# Patient Record
Sex: Female | Born: 1935 | Race: White | Hispanic: No | Marital: Married | Smoking: Never smoker
Health system: Southern US, Community
[De-identification: ages and names within clinical notes are randomized; demographics above are authoritative.]

## PROBLEM LIST (undated history)

## (undated) DIAGNOSIS — G309 Alzheimer's disease, unspecified: Secondary | ICD-10-CM

## (undated) DIAGNOSIS — F0281 Dementia in other diseases classified elsewhere with behavioral disturbance: Secondary | ICD-10-CM

## (undated) DIAGNOSIS — A09 Infectious gastroenteritis and colitis, unspecified: Secondary | ICD-10-CM

## (undated) DIAGNOSIS — E785 Hyperlipidemia, unspecified: Secondary | ICD-10-CM

## (undated) DIAGNOSIS — B300 Keratoconjunctivitis due to adenovirus: Secondary | ICD-10-CM

## (undated) DIAGNOSIS — Z9889 Other specified postprocedural states: Secondary | ICD-10-CM

## (undated) DIAGNOSIS — N183 Chronic kidney disease, stage 3 unspecified: Secondary | ICD-10-CM

## (undated) DIAGNOSIS — I10 Essential (primary) hypertension: Secondary | ICD-10-CM

## (undated) DIAGNOSIS — K219 Gastro-esophageal reflux disease without esophagitis: Secondary | ICD-10-CM

## (undated) DIAGNOSIS — F02818 Dementia in other diseases classified elsewhere, unspecified severity, with other behavioral disturbance: Secondary | ICD-10-CM

## (undated) DIAGNOSIS — E039 Hypothyroidism, unspecified: Secondary | ICD-10-CM

## (undated) DIAGNOSIS — K529 Noninfective gastroenteritis and colitis, unspecified: Secondary | ICD-10-CM

## (undated) DIAGNOSIS — M810 Age-related osteoporosis without current pathological fracture: Secondary | ICD-10-CM

## (undated) DIAGNOSIS — L821 Other seborrheic keratosis: Secondary | ICD-10-CM

## (undated) DIAGNOSIS — Z8719 Personal history of other diseases of the digestive system: Secondary | ICD-10-CM

## (undated) DIAGNOSIS — M1990 Unspecified osteoarthritis, unspecified site: Secondary | ICD-10-CM

## (undated) DIAGNOSIS — M75121 Complete rotator cuff tear or rupture of right shoulder, not specified as traumatic: Secondary | ICD-10-CM

## (undated) DIAGNOSIS — K5790 Diverticulosis of intestine, part unspecified, without perforation or abscess without bleeding: Secondary | ICD-10-CM

## (undated) HISTORY — DX: Dementia in other diseases classified elsewhere, unspecified severity, with other behavioral disturbance: F02.818

## (undated) HISTORY — DX: Noninfective gastroenteritis and colitis, unspecified: K52.9

## (undated) HISTORY — DX: Hyperlipidemia, unspecified: E78.5

## (undated) HISTORY — PX: OTHER SURGICAL HISTORY: SHX169

## (undated) HISTORY — DX: Complete rotator cuff tear or rupture of right shoulder, not specified as traumatic: M75.121

## (undated) HISTORY — DX: Other seborrheic keratosis: L82.1

## (undated) HISTORY — DX: Unspecified osteoarthritis, unspecified site: M19.90

## (undated) HISTORY — DX: Infectious gastroenteritis and colitis, unspecified: A09

## (undated) HISTORY — DX: Keratoconjunctivitis due to adenovirus: B30.0

## (undated) HISTORY — DX: Dementia in other diseases classified elsewhere with behavioral disturbance: F02.81

## (undated) HISTORY — DX: Alzheimer's disease, unspecified: G30.9

## (undated) HISTORY — DX: Hypothyroidism, unspecified: E03.9

## (undated) HISTORY — DX: Age-related osteoporosis without current pathological fracture: M81.0

## (undated) HISTORY — DX: Gastro-esophageal reflux disease without esophagitis: K21.9

## (undated) HISTORY — PX: ABDOMINAL HYSTERECTOMY: SHX81

## (undated) HISTORY — DX: Chronic kidney disease, stage 3 unspecified: N18.30

## (undated) HISTORY — DX: Diverticulosis of intestine, part unspecified, without perforation or abscess without bleeding: K57.90

## (undated) HISTORY — DX: Personal history of other diseases of the digestive system: Z87.19

## (undated) HISTORY — DX: Essential (primary) hypertension: I10

## (undated) HISTORY — DX: Chronic kidney disease, stage 3 (moderate): N18.3

## (undated) HISTORY — DX: Other specified postprocedural states: Z98.890

## (undated) HISTORY — PX: BREAST SURGERY: SHX581

---

## 1999-07-23 ENCOUNTER — Encounter: Payer: Self-pay | Admitting: Emergency Medicine

## 1999-07-23 ENCOUNTER — Emergency Department (HOSPITAL_COMMUNITY): Admission: EM | Admit: 1999-07-23 | Discharge: 1999-07-23 | Payer: Self-pay | Admitting: Emergency Medicine

## 2000-03-21 ENCOUNTER — Other Ambulatory Visit: Admission: RE | Admit: 2000-03-21 | Discharge: 2000-03-21 | Payer: Self-pay | Admitting: Gynecology

## 2000-03-21 ENCOUNTER — Encounter: Payer: Self-pay | Admitting: Gynecology

## 2000-03-21 ENCOUNTER — Encounter: Admission: RE | Admit: 2000-03-21 | Discharge: 2000-03-21 | Payer: Self-pay | Admitting: Gynecology

## 2000-05-16 ENCOUNTER — Encounter: Payer: Self-pay | Admitting: Urology

## 2000-05-18 ENCOUNTER — Observation Stay (HOSPITAL_COMMUNITY): Admission: RE | Admit: 2000-05-18 | Discharge: 2000-05-19 | Payer: Self-pay | Admitting: Urology

## 2001-01-29 ENCOUNTER — Encounter: Admission: RE | Admit: 2001-01-29 | Discharge: 2001-01-29 | Payer: Self-pay | Admitting: Family Medicine

## 2001-01-29 ENCOUNTER — Encounter: Payer: Self-pay | Admitting: Family Medicine

## 2001-11-06 ENCOUNTER — Encounter: Admission: RE | Admit: 2001-11-06 | Discharge: 2001-11-06 | Payer: Self-pay | Admitting: Urology

## 2001-11-06 ENCOUNTER — Encounter: Payer: Self-pay | Admitting: Urology

## 2002-01-07 ENCOUNTER — Encounter: Payer: Self-pay | Admitting: Gastroenterology

## 2002-01-07 ENCOUNTER — Ambulatory Visit (HOSPITAL_COMMUNITY): Admission: RE | Admit: 2002-01-07 | Discharge: 2002-01-07 | Payer: Self-pay | Admitting: Gastroenterology

## 2003-02-27 ENCOUNTER — Encounter: Payer: Self-pay | Admitting: Gastroenterology

## 2003-02-27 ENCOUNTER — Ambulatory Visit (HOSPITAL_COMMUNITY): Admission: RE | Admit: 2003-02-27 | Discharge: 2003-02-27 | Payer: Self-pay | Admitting: Gastroenterology

## 2003-03-05 ENCOUNTER — Encounter: Payer: Self-pay | Admitting: Gastroenterology

## 2003-03-05 ENCOUNTER — Ambulatory Visit (HOSPITAL_COMMUNITY): Admission: RE | Admit: 2003-03-05 | Discharge: 2003-03-05 | Payer: Self-pay | Admitting: Gastroenterology

## 2003-05-12 ENCOUNTER — Encounter: Payer: Self-pay | Admitting: Family Medicine

## 2003-05-12 ENCOUNTER — Encounter: Admission: RE | Admit: 2003-05-12 | Discharge: 2003-05-12 | Payer: Self-pay | Admitting: Family Medicine

## 2003-11-21 ENCOUNTER — Inpatient Hospital Stay (HOSPITAL_COMMUNITY): Admission: EM | Admit: 2003-11-21 | Discharge: 2003-11-24 | Payer: Self-pay | Admitting: Emergency Medicine

## 2004-09-06 ENCOUNTER — Ambulatory Visit: Payer: Self-pay | Admitting: Internal Medicine

## 2004-12-07 ENCOUNTER — Ambulatory Visit (HOSPITAL_COMMUNITY): Admission: RE | Admit: 2004-12-07 | Discharge: 2004-12-07 | Payer: Self-pay | Admitting: Orthopedic Surgery

## 2004-12-07 ENCOUNTER — Ambulatory Visit (HOSPITAL_BASED_OUTPATIENT_CLINIC_OR_DEPARTMENT_OTHER): Admission: RE | Admit: 2004-12-07 | Discharge: 2004-12-07 | Payer: Self-pay | Admitting: Orthopedic Surgery

## 2005-02-13 ENCOUNTER — Emergency Department (HOSPITAL_COMMUNITY): Admission: EM | Admit: 2005-02-13 | Discharge: 2005-02-13 | Payer: Self-pay | Admitting: Emergency Medicine

## 2005-10-02 ENCOUNTER — Emergency Department (HOSPITAL_COMMUNITY): Admission: EM | Admit: 2005-10-02 | Discharge: 2005-10-03 | Payer: Self-pay | Admitting: *Deleted

## 2005-10-19 ENCOUNTER — Ambulatory Visit: Payer: Self-pay | Admitting: Gastroenterology

## 2006-01-27 ENCOUNTER — Encounter: Admission: RE | Admit: 2006-01-27 | Discharge: 2006-01-27 | Payer: Self-pay | Admitting: Internal Medicine

## 2006-07-05 ENCOUNTER — Ambulatory Visit: Payer: Self-pay | Admitting: Gastroenterology

## 2006-07-10 ENCOUNTER — Ambulatory Visit (HOSPITAL_COMMUNITY): Admission: RE | Admit: 2006-07-10 | Discharge: 2006-07-10 | Payer: Self-pay | Admitting: Gastroenterology

## 2006-07-26 ENCOUNTER — Ambulatory Visit: Payer: Self-pay | Admitting: Gastroenterology

## 2006-08-15 ENCOUNTER — Ambulatory Visit: Payer: Self-pay | Admitting: Gastroenterology

## 2007-02-20 ENCOUNTER — Encounter: Admission: RE | Admit: 2007-02-20 | Discharge: 2007-02-20 | Payer: Self-pay | Admitting: Internal Medicine

## 2007-12-11 ENCOUNTER — Encounter: Admission: RE | Admit: 2007-12-11 | Discharge: 2007-12-11 | Payer: Self-pay | Admitting: Neurology

## 2008-02-22 ENCOUNTER — Encounter: Admission: RE | Admit: 2008-02-22 | Discharge: 2008-02-22 | Payer: Self-pay | Admitting: Family Medicine

## 2009-02-26 ENCOUNTER — Encounter: Admission: RE | Admit: 2009-02-26 | Discharge: 2009-02-26 | Payer: Self-pay | Admitting: Family Medicine

## 2010-10-13 ENCOUNTER — Encounter
Admission: RE | Admit: 2010-10-13 | Discharge: 2010-10-13 | Payer: Self-pay | Source: Home / Self Care | Attending: Legal Medicine | Admitting: Legal Medicine

## 2010-11-21 ENCOUNTER — Encounter: Payer: Self-pay | Admitting: Gastroenterology

## 2011-03-18 NOTE — Cardiovascular Report (Signed)
NAME:  [name], [name]                            ACCOUNT NO.:  [id number]   MEDICAL RECORD NO.:  [id number]                   PATIENT TYPE:  INP   LOCATION:  3731                                 FACILITY:  [facility]   PHYSICIAN:  Charlies Constable, M.D.                  DATE OF BIRTH:  1931-02-22   DATE OF PROCEDURE:  11/24/2003  DATE OF DISCHARGE:                              CARDIAC CATHETERIZATION   PAST MEDICAL HISTORY:  Ms. Vanderheyden is 77 years old and has not seen [place] before,  although I took care of her first husband who died about four years ago.  She has since remarried.  She has no prior history of known heart disease.  About a week prior to admission, she had a fall and hurt her left arm.  On  the 21st, she developed substernal chest pain with pain into her left arm  which was much more severe.  She came to the emergency room at [facility]  and was admitted by Dietrich Pates.  She was scheduled for evaluation with  angiography.   PAST MEDICAL HISTORY:  Significant for hyperlipidemia and reflux.   PROCEDURES:  The procedure was performed via right femoral artery and  arterial sheath and 6-French sheath and coronary catheters.  A __________  was performed and Omnipaque contrast was used.  The right femoral artery was  closed with Angio-Seal at the end of the procedure.  The patient tolerated  the procedure well and left the laboratory in satisfactory condition.   RESULTS:  1. LEFT MAIN CORONARY ARTERY.  The left main coronary artery is free of     significant disease.  2. LEFT ANTERIOR DESCENDING ARTERY.  The left anterior descending artery     gave rise to a large diagonal branch and two septal perforators.  These     in the LA__________  are free of significant disease.  3. LEFT CIRCUMFLEX ARTERY.  The left circumflex area was a codominant vessel     that gives rise to a small ramus branch, two marginal branches, and two     large posterolateral branches.  These vessels are free of  significant     disease.  4. RIGHT CORONARY ARTERY.  The right coronary artery was a small codominant     vessel that gave rise to a right ventricular branch and a small posterior     descending branch.  This vessel was free of significant disease.  5. LEFT VENTRICULOGRAM.  Left ventriculogram was performed in the RAO     projection and showed good wall motion but no areas of hypokinesis.  The     estimated ejection fraction was 60%.  6. The aortic pressure was 122/60 with a mean of 85.  The left ventricular     pressure was 122/17.   CONCLUSION:  Normal coronary angiography and left ventricular function.   RECOMMENDATIONS:  Reassurance.  In view of these findings, I think the  patient's symptoms were not related to her heart.  They may be related to a  combination of her fall and reflux.  We will plan to let her go home as an  outpatient today and see Dr. Shary Decamp in followup.                                               Charlies Constable, M.D.    BB/MEDQ  D:  11/24/2003  T:  11/24/2003  Job:  [id number]   cc:   Feliciana Rossetti, MD  [address]  [place]  [place] [address]  Fax: [phone]   Pricilla Riffle, M.D.   Charlies Constable, M.D.

## 2011-03-18 NOTE — Op Note (Signed)
[name], [name]                  ACCOUNT NO.:  [id number]   MEDICAL RECORD NO.:  [id number]          PATIENT TYPE:  AMB   LOCATION:  DSC                          FACILITY:  [facility]   PHYSICIAN:  Leonides Grills, M.D.     DATE OF BIRTH:  [birth year]   DATE OF PROCEDURE:  12/07/2004  DATE OF DISCHARGE:                                 OPERATIVE REPORT   PREOPERATIVE DIAGNOSIS:  Right hallux valgus.   POSTOPERATIVE DIAGNOSIS:  Right hallux valgus.   OPERATION:  1.  Right chevron bunionectomy.  2.  Stress x-rays of right foot.   SURGEON:  Leonides Grills, M.D.   ASSISTANT:  Lianne Cure, P.A.   ANESTHESIA:  General endotracheal tube.   ESTIMATED BLOOD LOSS:  Minimal.   TOURNIQUET TIME:  Approximately 45 minutes.   COMPLICATIONS:  None.   DISPOSITION:  Stable, to the PR.   INDICATIONS:  This is a 77 year old female with longstanding right great toe  pain that was interfering with her life despite conservative management, and  she was consented for the above procedure.  All risks, which include  infection, nerve or vessel injury, nonunion, malunion, heart irritation or  failure, persistent pain, worse pain, stiffness, arthritis, avascular  necrosis of the head with collapse and possible future fusion were all  explained, questions were encouraged and answered.   OPERATION:  The patient was brought to the operating room and placed in a  supine position.  After adequate general endotracheal tube anesthesia was  administered as well as Ancef 1 g IV piggyback, the right lower extremity  was then prepped and draped in a sterile manner with a proximally placed  thigh tourniquet.  The limb was gravity-exsanguinated.  Tourniquet was  elevated to 290 mmHg.  A longitudinal incision in the midline over the  medial aspect of the great toe MP joint was then made and dissection was  carried down through skin and hemostasis maintained.  Neurovascular  structures were identified both superiorly  and inferiorly and protected  throughout the case.  An L-shaped capsulotomy was then made.  A simple  bunionectomy was then performed with a sagittal saw.  Lateral capsule was  then released from within the joint using a curved Beaver blade.  The center  of the head was then identified, soft tissues elevated both superiorly and  inferiorly over the distal aspect of the metatarsal.  Chevron osteotomy was  then created with a sagittal saw.  Head was then translated approximately 3-  4 mm laterally and then affixed with a 2.0-mm fully threaded cortical set  screw using a 1.5-mm drill hole, respectively.  This was countersunk.  This  had excellent purchase and maintenance of the correction.  Redundant bone  medially was then trimmed off with a sagittal saw.  Rocky Link Johnson's ridge was  then rounded off with a rongeur.  The joint and area were copiously  irrigated with normal saline.  Capsule was then repaired and advanced both  superiorly and proximally, and affixed with 2-0 Vicryl suture; this had an  outstanding repair.  Stress x-rays were  obtained in the AP and lateral  planes and showed that the correction was excellent, fixation was in the  proper position and there was no gross motion of the osteotomy site.  Tourniquet was deflated, hemostasis was obtained.  I copiously irrigated the  capsule with normal saline.  Skin was closed with 4-0 nylon sutures.  A  sterile dressing was applied, [employer] dressing was applied, hard-sole  shoe was applied.  The patient was stable to PR.      PB/MEDQ  D:  12/07/2004  T:  12/07/2004  Job:  [id number]

## 2011-03-18 NOTE — Assessment & Plan Note (Signed)
[name]                           GASTROENTEROLOGY OFFICE NOTE   [name], [name]                         MRN:          [id number]  DATE:07/05/2006                            DOB:          June 23, 1931    [name] comes in on September 5, says she has some difficulty swallowing,  especially over the last several months.  Has previously had a diltation.  She has been getting sour brash in the mornings.   MEDICATIONS:  1. Ginkgo.  2. Cod liver oil.  3. Baby aspirin.  4. AcipHex.   She says she has trouble with liquid and solids. Drinks waters and gets  strangled and she also notes this with other liquids.   It should be noted her sister had colon cancer.   PHYSICAL EXAMINATION:  VITAL SIGNS:  She weighed 128.  Blood pressure  130/82.  Pulse 90 and regular.  NECK:  Unremarkable.   IMPRESSION:  1. 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  2. History of breast cancer.  3. Status post cholecystectomy, hysterectomy.  4. Osteoporosis.  5. Irritable bowel syndrome with a history of constipation component.   RECOMMENDATIONS:  1. Get a barium swallow with an upper gastrointestinal series.  2. I put her on Carafate suspension 1 teaspoon t.i.d.  3. MiraLax for her constipation.  4. Obtain colon screens on her.  5. Schedule her for an upper endoscopy with diltation and colonoscopy.                                   Ulyess Mort, MD   SML/MedQ  DD:  07/06/2006  DT:  07/07/2006  Job #:  [id number]

## 2011-03-18 NOTE — Assessment & Plan Note (Signed)
[facility]                           GASTROENTEROLOGY OFFICE NOTE   KHRISTIE, SAK                         MRN:          [id number]  DATE:07/26/2006                            DOB:          [birth year]    SUBJECTIVE:  Kallista comes in and says she is still having trouble swallowing.  The vent flow revealed primarily a motility problem with severe evidence of  dysmotility.  She says that the Carafate is not helping.  I suggested that she continue it for a little bit longer.   MEDICATIONS:  1. She should continue other medications which include Effexor.  2. Aciphex.   PLAN:  I told her that we should continue with the procedures at the end of  October and do an EGD and colonoscopy.  She was in agreement with this.            ______________________________  Ulyess Mort, MD      SML/MedQ  DD:  07/26/2006  DT:  07/28/2006  Job #:  [id number]

## 2011-03-18 NOTE — Discharge Summary (Signed)
NAMEJERRY, HAUGEN                            ACCOUNT NO.:  [id number]   MEDICAL RECORD NO.:  [id number]                   PATIENT TYPE:  INP   LOCATION:  3731                                 FACILITY:  [facility]   PHYSICIAN:  Doylene Canning. Ladona Ridgel, M.D.               DATE OF BIRTH:  [birth year]   DATE OF ADMISSION:  11/21/2003  DATE OF DISCHARGE:  11/24/2003                           DISCHARGE SUMMARY - REFERRING   PROCEDURES:  1. Cardiac catheterization.  2. Coronary arteriogram.  3. Left ventriculogram.  4. Three-view shoulder x-rays.   HOSPITAL COURSE:  Ms. Wieman is a 77 year old female with no known history of  coronary artery disease.  She had substernal chest tightness the night  before admission that radiated to her left arm.  She took Tums and aspirin  with no relief.  EMS gave her sublingual nitroglycerin x2 with her pain  relieved.  She has some left arm _________.  She had a fall last week after  tripping and landed on her left hip and arm.  She was admitted for further  evaluation and treatment.   Her enzymes were negative for MI, and a cardiac cath was considered  indicated based on her cardiac risk factors.  This was performed on November 24, 2003.  The cardiac catheterization showed normal coronary arteries with  normal left ventricular function and an EF of approximately 60%.  It was  felt that no further cardiac workup was indicated, and she could follow up  with Dr. Shary Decamp in [place].   Ms. Kory has a history of hyperlipidemia but has refused statin in the past  based on fears of liver damage.  A lipid profile was obtained during her  hospital stay here, and it showed a total cholesterol of 241, triglycerides  101, HDL 47, LDL 174.  The situation was discussed with Ms. Hert, who  stated that she had been eating a low-fat diet with eating meats that were  low in saturated fat and not using high-fat cooking processing.  She was  advised that diet control was not  sufficient for her, and she probably  needed the medication, but because she had no coronary artery disease, this  would be left up to Dr. Shary Decamp.  She was advised there were other classes of  medications besides statin as well as Zetia, which has a lower side-effect  profile than the other set.  She is to discuss this with Dr. Shary Decamp when she  sees him.  Ms. Hargraves also complained of bilateral breast tenderness, which  is not a known side effect of any of the medications she is currently  taking.  She stated she had a mammogram as an outpatient that showed no  abnormality, and she was advised to follow up with Dr. Shary Decamp on this as  well.  Ms. Batley also complained of a hematoma on her left hip.  She stated  that this had gotten larger since she had been in the hospital.  She still  had good movement of this leg and stated that she had x-rays in [place]  without any notification that there was any kind of fracture or damage.  She  was moving her leg well, and on examination, there was an approximately 3-4  cm hematoma that was soft, and resolving ecchymosis was seen as well.  She  was advised to follow up with Dr. Shary Decamp on that and notify him if the  hematoma became any larger.   Ms. Kunsman has a history of anxiety disorder and had been on Zoloft prior to  admission.  She was to continue on his medication at her home dose.   Post-cath, Ms. Trentham was ambulating without chest pain or shortness of  breath.  She was angiosealed, and her cath site had no ecchymosis or  hematoma.  She was considered stable for discharge on November 24, 2003, and  she was made aware that she needs to follow up with Dr. Shary Decamp within the  next two weeks.   Additionally, she had an iron profile checked because of some mild anemia.  The iron profile showed an iron level that was slightly low at 36 with a  TIBC of 274 and a percent saturation was low at 13.  Ferritin was low normal  at 47.  Homocysteine was within  normal limits at 10.25.  TSH at 3.915.   Ms. Foor was advised that she needed to take a multivitamin with iron q.d.,  but she refused, stating that the multivitamins made her go to the bathroom  more, and she has problems with this because of some ongoing bladder  issues.  She was given a prescription for Nu-Iron 150 mg q.d. with three  refills and is to follow up with Dr. Shary Decamp.  Stool guaiacs were ordered but  not performed during this admission.   DISCHARGE CONDITION:  Stable.   DISCHARGE DIAGNOSES:  1. Chest pain, negative coronary artery disease by catheterization.  2. Preserved left ventricular function by catheterization.  3. Status post fall, landing on her left side approximately a week ago with     a hematoma to her left hip and shoulder pain with x-rays showing old     healed left clavicular fracture, no acute abnormality.  4. Osteoporosis/degenerative joint disease.  5. Gastroesophageal reflux disease/hiatal hernia.  6. History of internal external hemorrhoids.  7. Hyperlipidemia.  8. Anxiety disorder.  9. Status post five breast lumpectomies.  10.      Status post cholecystectomy and partial hysterectomy.  11.      Family history of coronary artery disease in her sister.  12.      Status post cataract removal.   DISCHARGE INSTRUCTIONS:  Her activity level is to include no lifting or  strenuous activity for two days.  She is to see Dr. Shary Decamp within the next  two weeks.  She is to stick to a diet that is low in salt and fat.  She is  to call our office for any problems with the cath site.   DISCHARGE MEDICATIONS:  1. Zoloft 50 mg q.a.m., 75 at lunch, 75 q.h.s.  2. Folic acid 400 mcg q.d.  3. Aspirin 81 mg q.d.  4. Miacalcin nasal spray q.d.  5. Aciphex 20 mg q.d.  6. Nu-Iron 150 mg q.d.      Theodore Demark, P.A. [facility]  Doylene Canning. Ladona Ridgel, M.D.   RB/MEDQ  D:  11/24/2003  T:  11/24/2003  Job:  [id number]   cc:   Feliciana Rossetti, MD  [address]  [place]  [place] [address]  Fax: [phone]   Pricilla Riffle, M.D.

## 2011-03-18 NOTE — Op Note (Signed)
[facility]  Patient:    [name], [name]                       MRN: [id number] Proc. Date: 05/18/00 Adm. Date:  [id number] Disc. Date: [id number] Attending:  Laqueta Jean                           Operative Report  PREOPERATIVE DIAGNOSES:  Urinary incontinence, urethral stenosis.  POSTOPERATIVE DIAGNOSES:  Urinary incontinence, urethral stenosis.  OPERATION PERFORMED:  Pubovaginal sling with hydrodistention of the bladder and urethral dilation.  SURGEON:  Sigmund I. Patsi Sears, M.D.  PREPARATION:  After appropriate preanesthesia, the patient was brought to the operating room, placed on the operating table in the dorsal supine position where general endotracheal anesthesia was introduced. She was then placed in the dorsal lithotomy position where the pubis was prepped with Betadine solution and draped in the usual fashion.  HISTORY OF PRESENT ILLNESS:  This 77 year old has a history of stress and urge incontinence, with grade 1 cystourethrocele, and no improvement on anticholinergic therapy. The patient is status post hysterectomy. She has recently been placed on vaginal hormones. Uterus exam showed that the patient was unobstructed with a stable, compliant bladder. She had a rather small bladder capacity of 275 cc. She is now for a pubovaginal sling as well as hydrodistention of the bladder.  DESCRIPTION OF PROCEDURE:  A 20 cc of Marcaine 0.5% plus epinephrine 1:200,000 was injected into the vaginal epithelium laterally. Two parallel incisions were then made in the lateral surface of the vaginal epithelium, subcutaneous tissue dissected. The tissue was then dissected into the retropubic space with sharp and blunt dissection.  The anchor system was then used to place anchors with #1 Prolene sutures retropubically, and following this, a 7 cm x 2 cm portion of tutoplast was selected and soaked. This was folded in a standard fashion and  sutured with 3v suture. Following this, a tunnel was made across the midline of the vaginal tissue, and each of the ends of the fascia were loaded onto the #1 Prolene sutures and ligated. The sutures were cut close to the pubic tubercle.  Subcutaneous suture was placed with 3-0 Vicryl suture, and the wounds were closed with running buried 2-0 Vicryl suture.  Following this, cystoscopy was accomplished, which showed no suture within the bladder, and the bladder was hydrodistended to 900 cc. The patient tolerated the procedure well. She was given IV Toradol, awakened and taken to the recovery room in excellent condition. DD:  05/18/00 TD:  05/22/00 Job: [id number]

## 2011-03-18 NOTE — H&P (Signed)
NAMEMarland Kitchen  PRIYAH, SCHMUCK                            ACCOUNT NO.:  [id number]   MEDICAL RECORD NO.:  [id number]                   PATIENT TYPE:  INP   LOCATION:  5503                                 FACILITY:  [facility]   PHYSICIAN:  Pricilla Riffle, M.D.                 DATE OF BIRTH:  [birth year]   DATE OF ADMISSION:  11/21/2003  DATE OF DISCHARGE:                                HISTORY & PHYSICAL   IDENTIFICATION:  Ms. Ratcliffe is a 77 year old woman who is new to our  practice.  She has no history of coronary artery disease.  She presented to  the emergency room for evaluation of chest tightness.   HISTORY OF PRESENT ILLNESS:  The patient notes last evening she developed  chest tightness around 10 p.m. different from her reflux.  She denied any  pleuritic component.  She took and aspirin and Tylenol without relief and  called the EMS.  Associated with this was severe left arm pain like her  bones being ripped out.  Note, she had a history of a fall earlier in the  week where she hurt her arm, but says that this pain was much more severe.  EMS arrived.  Nitroglycerin was given x2, and the pain in her chest went  away.  The arm pain went down to a very dull ache.  It still feels heavy.   The patient says with activity she had noted some increased shortness of  breath, some increased fatigability, no chest pressure.   ALLERGIES:  None.   MEDICATIONS:  1. Zoloft.  2. Folic acid.  3. Aciphex.  4. Miacalcin.   PAST MEDICAL HISTORY:  1. Osteoporosis.  2. Gastroesophageal reflux.  3. History of internal/ external hemorrhoids.  4. History of dyslipidemia.  5. Bad cholesterol, per her report was 250, does not want to take medicine.  6. History of anxiety disorder.   PAST SURGICAL HISTORY:  Status post lumpectomy x5, status post colonoscopy,  status post partial hysterectomy.   SOCIAL HISTORY:  The patient lives in [place].  She is widowed.  She  does not smoke and does not drink.   FAMILY HISTORY:  Mother died of cancer at age 52.  Father died.  One sister  with coronary artery disease and 60.   REVIEW OF SYSTEMS:  The patient notes occasional headaches frontal.  Some  unsteadiness on feet and has fallen.  No other neurological complaints.  All  other systems reviewed, negative except problems as noted.   PHYSICAL EXAMINATION:  GENERAL:  The patient notes minimal ache in her left  arm, otherwise in no acute distress.  VITAL SIGNS:  Blood pressure 115/46, pulse 66, temperature 98.5.  NECK:  JVP is normal, no bruits.  LUNGS:  Clear.  CARDIAC:  Exam reveals a regular rate and rhythm, S1, S2, no S3, no S4, a  grade 1-2/6 systolic murmur in  the left sternal border consistent with TR.  CHEST:  Very tender over left breast, not the pain she has been having.  ABDOMEN:  Exam is benign.  EXTREMITIES:  There are 2+ distal pulses, no lower-extremity edema,  bilateral femoral bruits.   LABORATORY DATA:  Significant for hemoglobin of 11.2, hematocrit 32.8,  normocytic, normochromic.  WBC 7.2, platelets 270,000.  BUN and creatinine  of 17 and 0.9.  Potassium 3.6.  CK-MB x2 negative, troponin x2 negative.   A 12-lead EKG:  Normal sinus rhythm at a rate of 65 beats per minute, no  acute ST changes.   Chest x-ray:  No acute disease, old left clavicular fracture per emergency  department report.   IMPRESSION:  Ms. Naples is a 77 year old woman with no known coronary artery  disease, still she presented last night with chest pain, relieved with  nitroglycerin, severe left-arm pain improved with nitroglycerin, concerning  for angina.  It is unlike her other aches and pains.  I discussed this with  the family and the patient.  She has risk factors for coronary disease with  family history and an increased cholesterol.   RECOMMENDATION:  I would recommend cardiac catheterization to define her  anatomy.  Discussed the risks and benefits.  The patient understands and  agrees to  proceed.   PLAN:  1. Continue medical therapy and place the patient on add-on board.  2. We will check fasting lipid panel.                                                Pricilla Riffle, M.D.    PVR/MEDQ  D:  11/21/2003  T:  11/22/2003  Job:  [id number]

## 2011-03-18 NOTE — Cardiovascular Report (Signed)
[name], [name]                            ACCOUNT NO.:  [id number]   MEDICAL RECORD NO.:  [id number]                   PATIENT TYPE:  INP   LOCATION:  3731                                 FACILITY:  [facility]   PHYSICIAN:  Charlies Constable, M.D.                  DATE OF BIRTH:  08-30-1931   DATE OF PROCEDURE:  11/24/2003  DATE OF DISCHARGE:                              CARDIAC CATHETERIZATION   CLINICAL HISTORY:  Ms. [name] is 77 years old and has documented coronary  disease.  She was admitted on August 31 and underwent urgent cath by Dr.  Samule Ohm and found to have a tight lesion in the right coronary artery as well  as left coronary disease.  Because of GI bleeding, the lesion was not done  acutely and was planned to be scheduled later.  In the interim, she  developed an acute DMI and had emergent stenting of the mid and distal right  coronary arteries.  She has done well after that, but over the last several  weeks has developed exertional chest pain and then a more severe chest pain  requiring admission with unstable angina.   PROCEDURE:  The procedure was performed via the right femoral artery using  arterial sheath and 6 French preformed coronary catheters. A front wall  arterial puncture was performed and Omnipaque contrast was used.  We had  damping of the left main with a 6 [place] JL-4 catheter and tried a 6 [place]  JL-4 guiding catheter with side holes, but this still had some damping.  We  then switched to a 5 French catheter and still there was some damping.  Distal aortogram was performed to rule out abdominal aortic aneurysm.  The  patient tolerated the procedure well and left the laboratory in satisfactory  condition.   RESULTS:  Left main coronary artery:  The left main coronary had 60-70%  ostial stenosis.  There was minimal reflux of contrast around the 5 French  catheter.   Left anterior descending artery:  The left anterior descending artery had  moderate  calcification and there was a 70-80% lesion in the proximal LAD.  There was a first small diagonal branch and a second large diagonal branch  and three septal perforators.  The second large diagonal branch had 70%  narrowing in one of the subbranches.   Circumflex artery:  The circumflex artery gave rise to a marginal branch and  an AV branch which terminated in the posterior lateral branch.  There was  80% narrowing in the mid circumflex artery after the marginal branch.  There  was 90% narrowing in the marginal branch.   Right coronary artery:  The right coronary artery was a large dominant  vessel that gave rise to a right ventricular branch, posterior descending  branch and three posterior lateral branches.  There was 50% narrowing at the  stent site in  the mid right coronary artery, 30% narrowing in the mid to  distal right coronary artery and 95% in-stent restenosis in the stent in the  distal right coronary artery.  These were Hepacoat stents.   LEFT VENTRICULOGRAM:  The left ventriculogram performed in the RAO  projection showed good wall motion with no areas of hypokinesis.  The  estimated ejection fraction was 60%.   DISTAL AORTOGRAM:  A distal aortogram was performed which showed patent  renal arteries.  There was an aneurysm proximal to the bifurcation which  appeared to be small, but long.  The distal iliac appeared to be free of  major obstruction.   The aortic pressure was 188/74 with a mean of 113 and left ventricular  pressure was 188/24.   CONCLUSION:  1. Coronary artery disease status post prior percutaneous coronary     intervention and DMI as described above with 60-70% narrowing in the     ostium of the left main coronary artery, 70-80% narrowing in the proximal     LAD, 80% narrowing in the mid circumflex artery with 90% narrowing in the     marginal branch, 50% in-stent restenosis in the mid right coronary artery     and 95% in-stent restenosis in the distal  right coronary with normal LV     function.  2. Small distal abdominal aortic aneurysm.   RECOMMENDATIONS:  The patient has developed tight restenosis within the  stent in the distal right coronary, but also has left main and severe three-  vessel disease.  I reviewed these films with Dr. Samule Ohm and we feel surgical  revascularization would be the patient's therapeutic option.  Surgical  consultation has been obtained.                                               Charlies Constable, M.D.    BB/MEDQ  D:  11/24/2003  T:  11/24/2003  Job:  [id number]   cc:   Salvadore Farber, M.D.  [address]  [address]  [place]  [place] [address]   Buren Kos, M.D.  [address] [address]  [place], [place] [address]  Fax: [phone]

## 2011-09-07 ENCOUNTER — Other Ambulatory Visit: Payer: Self-pay | Admitting: Legal Medicine

## 2011-09-07 DIAGNOSIS — Z1231 Encounter for screening mammogram for malignant neoplasm of breast: Secondary | ICD-10-CM

## 2011-10-17 ENCOUNTER — Ambulatory Visit
Admission: RE | Admit: 2011-10-17 | Discharge: 2011-10-17 | Disposition: A | Discharge status: Home / Self Care | Payer: Self-pay | Source: Ambulatory Visit | Attending: Legal Medicine | Admitting: Legal Medicine

## 2011-10-17 DIAGNOSIS — Z1231 Encounter for screening mammogram for malignant neoplasm of breast: Secondary | ICD-10-CM

## 2012-09-17 ENCOUNTER — Other Ambulatory Visit: Payer: Self-pay | Admitting: Legal Medicine

## 2012-09-17 DIAGNOSIS — Z1231 Encounter for screening mammogram for malignant neoplasm of breast: Secondary | ICD-10-CM

## 2012-10-23 ENCOUNTER — Ambulatory Visit: Payer: Medicare Other

## 2012-11-09 ENCOUNTER — Ambulatory Visit: Payer: Medicare Other

## 2012-11-29 ENCOUNTER — Other Ambulatory Visit: Payer: Self-pay | Admitting: Legal Medicine

## 2012-11-29 DIAGNOSIS — N6312 Unspecified lump in the right breast, upper inner quadrant: Secondary | ICD-10-CM

## 2012-12-11 ENCOUNTER — Ambulatory Visit
Admission: RE | Admit: 2012-12-11 | Discharge: 2012-12-11 | Disposition: A | Discharge status: Home / Self Care | Payer: Medicare Other | Source: Ambulatory Visit | Attending: Legal Medicine | Admitting: Legal Medicine

## 2012-12-11 ENCOUNTER — Other Ambulatory Visit: Payer: Self-pay | Admitting: Legal Medicine

## 2012-12-11 DIAGNOSIS — N6312 Unspecified lump in the right breast, upper inner quadrant: Secondary | ICD-10-CM

## 2012-12-20 ENCOUNTER — Ambulatory Visit: Payer: Medicare Other

## 2013-12-06 ENCOUNTER — Other Ambulatory Visit: Payer: Self-pay

## 2013-12-06 DIAGNOSIS — Z1231 Encounter for screening mammogram for malignant neoplasm of breast: Secondary | ICD-10-CM

## 2013-12-23 ENCOUNTER — Ambulatory Visit: Payer: Medicare Other

## 2014-01-01 ENCOUNTER — Ambulatory Visit
Admission: RE | Admit: 2014-01-01 | Discharge: 2014-01-01 | Disposition: A | Discharge status: Home / Self Care | Payer: Self-pay | Source: Ambulatory Visit

## 2014-01-01 DIAGNOSIS — Z1231 Encounter for screening mammogram for malignant neoplasm of breast: Secondary | ICD-10-CM

## 2014-12-03 DIAGNOSIS — I10 Essential (primary) hypertension: Secondary | ICD-10-CM | POA: Diagnosis not present

## 2014-12-03 DIAGNOSIS — G308 Other Alzheimer's disease: Secondary | ICD-10-CM | POA: Diagnosis not present

## 2014-12-03 DIAGNOSIS — Z6827 Body mass index (BMI) 27.0-27.9, adult: Secondary | ICD-10-CM | POA: Diagnosis not present

## 2014-12-03 DIAGNOSIS — E785 Hyperlipidemia, unspecified: Secondary | ICD-10-CM | POA: Diagnosis not present

## 2014-12-03 DIAGNOSIS — E039 Hypothyroidism, unspecified: Secondary | ICD-10-CM | POA: Diagnosis not present

## 2014-12-10 ENCOUNTER — Other Ambulatory Visit: Payer: Self-pay

## 2014-12-10 DIAGNOSIS — Z1231 Encounter for screening mammogram for malignant neoplasm of breast: Secondary | ICD-10-CM

## 2015-01-05 ENCOUNTER — Ambulatory Visit
Admission: RE | Admit: 2015-01-05 | Discharge: 2015-01-05 | Disposition: A | Discharge status: Home / Self Care | Payer: Medicare Other | Source: Ambulatory Visit

## 2015-01-05 DIAGNOSIS — Z1231 Encounter for screening mammogram for malignant neoplasm of breast: Secondary | ICD-10-CM | POA: Diagnosis not present

## 2015-01-19 DIAGNOSIS — K219 Gastro-esophageal reflux disease without esophagitis: Secondary | ICD-10-CM | POA: Diagnosis not present

## 2015-01-19 DIAGNOSIS — K529 Noninfective gastroenteritis and colitis, unspecified: Secondary | ICD-10-CM | POA: Diagnosis not present

## 2015-02-27 DIAGNOSIS — N281 Cyst of kidney, acquired: Secondary | ICD-10-CM | POA: Diagnosis not present

## 2015-02-27 DIAGNOSIS — R1013 Epigastric pain: Secondary | ICD-10-CM | POA: Diagnosis not present

## 2015-02-27 DIAGNOSIS — J9811 Atelectasis: Secondary | ICD-10-CM | POA: Diagnosis not present

## 2015-02-27 DIAGNOSIS — R509 Fever, unspecified: Secondary | ICD-10-CM | POA: Diagnosis not present

## 2015-02-27 DIAGNOSIS — R112 Nausea with vomiting, unspecified: Secondary | ICD-10-CM | POA: Diagnosis not present

## 2015-02-27 DIAGNOSIS — K529 Noninfective gastroenteritis and colitis, unspecified: Secondary | ICD-10-CM | POA: Diagnosis not present

## 2015-02-27 DIAGNOSIS — K5730 Diverticulosis of large intestine without perforation or abscess without bleeding: Secondary | ICD-10-CM | POA: Diagnosis not present

## 2015-02-27 DIAGNOSIS — R109 Unspecified abdominal pain: Secondary | ICD-10-CM | POA: Diagnosis not present

## 2015-02-27 DIAGNOSIS — R918 Other nonspecific abnormal finding of lung field: Secondary | ICD-10-CM | POA: Diagnosis not present

## 2015-03-09 DIAGNOSIS — Z6826 Body mass index (BMI) 26.0-26.9, adult: Secondary | ICD-10-CM | POA: Diagnosis not present

## 2015-03-09 DIAGNOSIS — R1110 Vomiting, unspecified: Secondary | ICD-10-CM | POA: Diagnosis not present

## 2015-03-11 ENCOUNTER — Encounter: Payer: Self-pay | Admitting: Physician Assistant

## 2015-03-25 ENCOUNTER — Encounter: Payer: Self-pay | Admitting: *Deleted

## 2015-03-26 ENCOUNTER — Encounter: Payer: Self-pay | Admitting: Physician Assistant

## 2015-03-26 ENCOUNTER — Ambulatory Visit (INDEPENDENT_AMBULATORY_CARE_PROVIDER_SITE_OTHER): Payer: Medicare Other | Admitting: Physician Assistant

## 2015-03-26 VITALS — BP 118/68 | HR 96 | Ht 62.5 in | Wt 155.2 lb

## 2015-03-26 DIAGNOSIS — G309 Alzheimer's disease, unspecified: Secondary | ICD-10-CM | POA: Diagnosis not present

## 2015-03-26 DIAGNOSIS — I10 Essential (primary) hypertension: Secondary | ICD-10-CM | POA: Diagnosis not present

## 2015-03-26 DIAGNOSIS — K219 Gastro-esophageal reflux disease without esophagitis: Secondary | ICD-10-CM

## 2015-03-26 DIAGNOSIS — Z9049 Acquired absence of other specified parts of digestive tract: Secondary | ICD-10-CM

## 2015-03-26 DIAGNOSIS — F0280 Dementia in other diseases classified elsewhere without behavioral disturbance: Secondary | ICD-10-CM

## 2015-03-26 DIAGNOSIS — R112 Nausea with vomiting, unspecified: Secondary | ICD-10-CM

## 2015-03-26 DIAGNOSIS — N183 Chronic kidney disease, stage 3 unspecified: Secondary | ICD-10-CM

## 2015-03-26 MED ORDER — ONDANSETRON HCL 4 MG PO TABS: 4.0000 mg | ORAL_TABLET | Freq: Three times a day (TID) | ORAL | Status: DC | PRN

## 2015-03-26 MED ORDER — OMEPRAZOLE 40 MG PO CPDR: 40.0000 mg | DELAYED_RELEASE_CAPSULE | Freq: Every day | ORAL | Status: DC

## 2015-03-26 NOTE — Progress Notes (Addendum)
Patient ID: [name], female   DOB: [birth year], 81 y.o.   MRN: [id number]   Subjective:    Patient ID: [name], female    DOB: [birth year], 81 y.o.   MRN: [id number]  HPI [name] is an 81 year old white female known remotely to Dr. Delray AltSamuel Bauer who is referred today by [facility] [place] [facility] [place] Dr. Brent BullaLawrence Perry for evaluation of recurrent nausea and vomiting. Unfortunately patient has advanced Alzheimer's dementia in his very poor historian. She also has history of hypertension hyperlipidemia chronic kidney disease stage III. She is status post cholecystectomy and hysterectomy.  Her daughter who accompanied her's her states that she had a colonoscopy many years ago and believes that she also had her esophagus dilated at least once in the past. Patient had been on Nexium 40 mg by mouth every morning for chronic GERD and early her insurance stopped paying for this about a month or so ago and her symptoms with vomiting started shortly after that. She had an episode on 02/28/2015 with several episodes of vomiting and diarrhea and was [facility] to [facility]. She had labs and CT of the abdomen and pelvis done both of which were apparently unremarkable I do not have copies of those reports available currently. She was then seen by Dr. Marina GoodellPerry. Her daughter states that as long as she is taking Zofran 4 mg every morning, omeprazole and dicyclomine she did not have any symptoms. She ran out of all of those medications a few days back in her vomiting came back the following day. She did get constipated with the dicyclomine. She states that her mother will not complain of pain and is hard to tell when she is hurting. They have not noted that she's having difficulty swallowing choking etc. Patient herself says she has pain off and on but cannot be specific.  Review of Systems Pertinent positive and negative review of systems were noted in the above HPI section.  All other review of  systems was otherwise negative.  Outpatient Encounter Prescriptions as of 03/26/2015  Medication Sig  . alendronate (FOSAMAX) 70 MG tablet Take 70 mg by mouth once a week. Take 1 tab once a week for 4 weeks.  . fluticasone (FLOVENT DISKUS) 50 MCG/BLIST diskus inhaler Inhale 2 puffs into the lungs daily.  Marland Kitchen. levothyroxine (SYNTHROID, LEVOTHROID) 25 MCG tablet Take 25 mcg by mouth daily before breakfast.  . meclizine (ANTIVERT) 25 MG tablet Take 25 mg by mouth 3 (three) times daily as needed for dizziness. Take 1 tab 3 times a day as needed for 10 days.  . memantine (NAMENDA XR) 28 MG CP24 24 hr capsule Take 28 mg by mouth. Take 1 capsule once a day for 14 days.  . ondansetron (ZOFRAN) 4 MG tablet Take 1 tablet (4 mg total) by mouth every 8 (eight) hours as needed for nausea or vomiting.  . rivastigmine (EXELON) 9.5 mg/24hr Place 9.5 mg onto the skin daily. Take 1 patch once a day.  . [DISCONTINUED] dicyclomine (BENTYL) 20 MG tablet Take 20 mg by mouth every 8 (eight) hours.  . [DISCONTINUED] omeprazole (PRILOSEC) 20 MG capsule Take 20 mg by mouth daily.  . [DISCONTINUED] ondansetron (ZOFRAN) 4 MG tablet Take 4 mg by mouth every 8 (eight) hours as needed.  . [DISCONTINUED] ranitidine (ZANTAC) 150 MG capsule Take 150 mg by mouth 2 (two) times daily.  . [DISCONTINUED] triamcinolone cream (KENALOG) 0.1 % Apply 1 application topically 3 (three) times daily.  .Marland Kitchen  omeprazole (PRILOSEC) 40 MG capsule Take 1 capsule (40 mg total) by mouth daily.   No facility-administered encounter medications on file as of 03/26/2015.   No Known Allergies Patient Active Problem List   Diagnosis Date Noted  . Alzheimer's dementia 03/26/2015  . GERD (gastroesophageal reflux disease) 03/26/2015  . HTN (hypertension) 03/26/2015  . CKD (chronic kidney disease), stage III 03/26/2015  . S/P cholecystectomy 03/26/2015   History   Social History  . Marital Status: Married    Spouse Name: N/A  . Number of Children: 2  .  Years of Education: N/A   Occupational History  . Retired    Social History Main Topics  . Smoking status: Never Smoker   . Smokeless tobacco: Never Used  . Alcohol Use: No  . Drug Use: No  . Sexual Activity: Not on file   Other Topics Concern  . Not on file   Social History Narrative    [name]'s family history includes Heart disease in her sister; Irritable bowel syndrome in her sister; Ovarian cancer in her mother. There is no history of Colon cancer, Colon polyps, or Diabetes.      Objective:    Filed Vitals:   03/26/15 1039  BP: 118/68  Pulse: 96    Physical Exam  well-developed elderly white female in no acute distress, pleasant accompanied by her daughter blood pressure 118/68 pulse 96 height 5 foot 2 weight 155. HEENT nontraumatic normocephalic EOMI PERRLA sclera anicteric, Supple no JVD, Cardiovascular regular rate and rhythm with S1-S2, Pulmonary clear bilaterally, Abdomen soft basically nontender there is no palpable mass or hepatosplenomegaly bowel sounds are present, Rectal exam not done, Stress remedies no clubbing cyanosis or edema skin warm and dry, Psych affect somewhat flat it but able to answer questions and short yes or no answers       Assessment & Plan:   #1 81 yo female with advance Alzheimers dementia with recurrent vomiting-etiology not clear #2 GERD #3 s/p GB  #4 HTN  #5 CKD stage 3  Plan- will obtain copies of labs and CT from [facility] for review Would like to avoid procedures given advanced age and dementia Will check Ba swallow and UGI  Continue Zofran 4 mg q 6-8 hours  Send Rx for higherdose Omeprazole 40 mg po daily in am  Stay off Bentyl  Add Miralax 17 gm in 8 oz water daily  Addendum; old records reviewed- she had EGD 2007 Dr Corinda Gubler with finding of esophageal stricture  Which was dilated Maloney to 24F, and chronic gastritis.Last Colon also 2007 with moderate left colon diverticulosis.    Rufus Cypert S Matthias Bogus  PA-C 03/26/2015   Cc: No ref. provider found

## 2015-03-26 NOTE — Patient Instructions (Addendum)
Continue Zofran 4 mg by mouth twice daily. We sent a prescription for Omeprazole 40 mg, take 1 tab daily. Miralaz 17 grams in 8 ounces of water every morning.  You have been scheduled for a Barium Esophogram and  at [facility] (1st floor of the hospital) on 04-02-2015.  Please arrive 15 minutes prior to your appointment for registration. Make certain not to have anything to eat or drink 6 hours prior to your test. If you need to reschedule for any reason, please contact radiology at [phone] to do so. __________________________________________________________________ A barium swallow is an examination that concentrates on views of the esophagus. This tends to be a double contrast exam (barium and two liquids which, when combined, create a gas to distend the wall of the oesophagus) or single contrast (non-ionic iodine based). The study is usually tailored to your symptoms so a good history is essential. Attention is paid during the study to the form, structure and configuration of the esophagus, looking for functional disorders (such as aspiration, dysphagia, achalasia, motility and reflux) EXAMINATION You may be asked to change into a gown, depending on the type of swallow being performed. A radiologist and radiographer will perform the procedure. The radiologist will advise you of the type of contrast selected for your procedure and direct you during the exam. You will be asked to stand, sit or lie in several different positions and to hold a small amount of fluid in your mouth before being asked to swallow while the imaging is performed .In some instances you may be asked to swallow barium coated marshmallows to assess the motility of a solid food bolus. The exam can be recorded as a digital or video fluoroscopy procedure. POST PROCEDURE It will take 1-2 days for the barium to pass through your system. To facilitate this, it is important, unless otherwise directed, to increase your fluids  for the next 24-48hrs and to resume your normal diet.  This test typically takes about 30 minutes to perform. __________________________________________________________________________________

## 2015-03-26 NOTE — Progress Notes (Signed)
Reviewed and agree with management. Vielka Klinedinst D. Annie Saephan, M.D., FACG  

## 2015-04-02 ENCOUNTER — Ambulatory Visit (HOSPITAL_COMMUNITY)
Admission: RE | Admit: 2015-04-02 | Discharge: 2015-04-02 | Disposition: A | Discharge status: Home / Self Care | Payer: Medicare Other | Source: Ambulatory Visit | Attending: Physician Assistant | Admitting: Physician Assistant

## 2015-04-02 ENCOUNTER — Other Ambulatory Visit (HOSPITAL_COMMUNITY): Payer: Medicare Other

## 2015-04-02 ENCOUNTER — Ambulatory Visit (HOSPITAL_COMMUNITY): Admission: RE | Admit: 2015-04-02 | Payer: Medicare Other | Source: Ambulatory Visit

## 2015-04-02 ENCOUNTER — Other Ambulatory Visit: Payer: Self-pay | Admitting: Physician Assistant

## 2015-04-02 ENCOUNTER — Ambulatory Visit (HOSPITAL_COMMUNITY): Payer: Medicare Other

## 2015-04-02 DIAGNOSIS — K219 Gastro-esophageal reflux disease without esophagitis: Secondary | ICD-10-CM

## 2015-04-02 DIAGNOSIS — R112 Nausea with vomiting, unspecified: Secondary | ICD-10-CM

## 2015-04-02 DIAGNOSIS — K224 Dyskinesia of esophagus: Secondary | ICD-10-CM | POA: Insufficient documentation

## 2015-04-02 DIAGNOSIS — K449 Diaphragmatic hernia without obstruction or gangrene: Secondary | ICD-10-CM | POA: Insufficient documentation

## 2015-04-06 ENCOUNTER — Telehealth: Payer: Self-pay

## 2015-04-06 NOTE — Telephone Encounter (Signed)
Information left on the voicemail [facility].

## 2015-04-06 NOTE — Telephone Encounter (Signed)
Yes I would keep her on Omeprazole 40 mg po daily, and yes its fine to keep her on Zofran 4 mg once or twice every day, soft diet

## 2015-04-06 NOTE — Telephone Encounter (Signed)
Spoke with Kayla StarchJane Schroeder about the barium swallow results. She wants to know if the Prilosec dosage will remain the same. She is concerned about the vomiting also. Can she keep her on Zofran daily?

## 2015-06-03 DIAGNOSIS — G308 Other Alzheimer's disease: Secondary | ICD-10-CM | POA: Diagnosis not present

## 2015-06-03 DIAGNOSIS — Z6825 Body mass index (BMI) 25.0-25.9, adult: Secondary | ICD-10-CM | POA: Diagnosis not present

## 2015-06-03 DIAGNOSIS — N810 Urethrocele: Secondary | ICD-10-CM | POA: Diagnosis not present

## 2015-06-03 DIAGNOSIS — E785 Hyperlipidemia, unspecified: Secondary | ICD-10-CM | POA: Diagnosis not present

## 2015-06-03 DIAGNOSIS — E039 Hypothyroidism, unspecified: Secondary | ICD-10-CM | POA: Diagnosis not present

## 2015-06-03 DIAGNOSIS — I10 Essential (primary) hypertension: Secondary | ICD-10-CM | POA: Diagnosis not present

## 2015-06-03 DIAGNOSIS — Z0000 Encounter for general adult medical examination without abnormal findings: Secondary | ICD-10-CM | POA: Diagnosis not present

## 2015-06-16 DIAGNOSIS — Z1211 Encounter for screening for malignant neoplasm of colon: Secondary | ICD-10-CM | POA: Diagnosis not present

## 2015-09-07 DIAGNOSIS — Z23 Encounter for immunization: Secondary | ICD-10-CM | POA: Diagnosis not present

## 2015-10-16 DIAGNOSIS — J3489 Other specified disorders of nose and nasal sinuses: Secondary | ICD-10-CM | POA: Diagnosis not present

## 2015-10-16 DIAGNOSIS — Z6825 Body mass index (BMI) 25.0-25.9, adult: Secondary | ICD-10-CM | POA: Diagnosis not present

## 2015-12-04 ENCOUNTER — Other Ambulatory Visit: Payer: Self-pay

## 2015-12-04 DIAGNOSIS — Z1231 Encounter for screening mammogram for malignant neoplasm of breast: Secondary | ICD-10-CM

## 2015-12-07 DIAGNOSIS — G308 Other Alzheimer's disease: Secondary | ICD-10-CM | POA: Diagnosis not present

## 2015-12-07 DIAGNOSIS — Z6826 Body mass index (BMI) 26.0-26.9, adult: Secondary | ICD-10-CM | POA: Diagnosis not present

## 2015-12-07 DIAGNOSIS — E785 Hyperlipidemia, unspecified: Secondary | ICD-10-CM | POA: Diagnosis not present

## 2015-12-07 DIAGNOSIS — I10 Essential (primary) hypertension: Secondary | ICD-10-CM | POA: Diagnosis not present

## 2015-12-07 DIAGNOSIS — E039 Hypothyroidism, unspecified: Secondary | ICD-10-CM | POA: Diagnosis not present

## 2016-01-05 DIAGNOSIS — R402411 Glasgow coma scale score 13-15, in the field [EMT or ambulance]: Secondary | ICD-10-CM | POA: Diagnosis not present

## 2016-01-05 DIAGNOSIS — Z79899 Other long term (current) drug therapy: Secondary | ICD-10-CM | POA: Diagnosis not present

## 2016-01-05 DIAGNOSIS — E7800 Pure hypercholesterolemia, unspecified: Secondary | ICD-10-CM | POA: Diagnosis not present

## 2016-01-05 DIAGNOSIS — K219 Gastro-esophageal reflux disease without esophagitis: Secondary | ICD-10-CM | POA: Diagnosis not present

## 2016-01-05 DIAGNOSIS — N390 Urinary tract infection, site not specified: Secondary | ICD-10-CM | POA: Diagnosis not present

## 2016-01-05 DIAGNOSIS — N3000 Acute cystitis without hematuria: Secondary | ICD-10-CM | POA: Diagnosis not present

## 2016-01-06 ENCOUNTER — Ambulatory Visit: Payer: Medicare Other

## 2016-01-07 ENCOUNTER — Emergency Department (HOSPITAL_COMMUNITY): Payer: Medicare Other

## 2016-01-07 ENCOUNTER — Inpatient Hospital Stay (HOSPITAL_COMMUNITY)
Admission: EM | Admit: 2016-01-07 | Discharge: 2016-01-12 | DRG: 064 | Disposition: A | Discharge status: Rehabilitation Facility | Payer: Medicare Other | Attending: Internal Medicine | Admitting: Internal Medicine

## 2016-01-07 ENCOUNTER — Encounter (HOSPITAL_COMMUNITY): Payer: Self-pay | Admitting: Emergency Medicine

## 2016-01-07 DIAGNOSIS — N390 Urinary tract infection, site not specified: Secondary | ICD-10-CM | POA: Diagnosis present

## 2016-01-07 DIAGNOSIS — I129 Hypertensive chronic kidney disease with stage 1 through stage 4 chronic kidney disease, or unspecified chronic kidney disease: Secondary | ICD-10-CM | POA: Diagnosis present

## 2016-01-07 DIAGNOSIS — N183 Chronic kidney disease, stage 3 unspecified: Secondary | ICD-10-CM | POA: Diagnosis present

## 2016-01-07 DIAGNOSIS — I63032 Cerebral infarction due to thrombosis of left carotid artery: Secondary | ICD-10-CM | POA: Diagnosis not present

## 2016-01-07 DIAGNOSIS — F0390 Unspecified dementia without behavioral disturbance: Secondary | ICD-10-CM | POA: Diagnosis not present

## 2016-01-07 DIAGNOSIS — I63232 Cerebral infarction due to unspecified occlusion or stenosis of left carotid arteries: Secondary | ICD-10-CM | POA: Diagnosis not present

## 2016-01-07 DIAGNOSIS — M25521 Pain in right elbow: Secondary | ICD-10-CM | POA: Diagnosis not present

## 2016-01-07 DIAGNOSIS — I639 Cerebral infarction, unspecified: Secondary | ICD-10-CM | POA: Diagnosis present

## 2016-01-07 DIAGNOSIS — I63233 Cerebral infarction due to unspecified occlusion or stenosis of bilateral carotid arteries: Secondary | ICD-10-CM | POA: Diagnosis not present

## 2016-01-07 DIAGNOSIS — I6789 Other cerebrovascular disease: Secondary | ICD-10-CM | POA: Diagnosis not present

## 2016-01-07 DIAGNOSIS — I10 Essential (primary) hypertension: Secondary | ICD-10-CM | POA: Diagnosis present

## 2016-01-07 DIAGNOSIS — Z79899 Other long term (current) drug therapy: Secondary | ICD-10-CM | POA: Diagnosis not present

## 2016-01-07 DIAGNOSIS — E039 Hypothyroidism, unspecified: Secondary | ICD-10-CM | POA: Diagnosis not present

## 2016-01-07 DIAGNOSIS — F0281 Dementia in other diseases classified elsewhere with behavioral disturbance: Secondary | ICD-10-CM | POA: Diagnosis present

## 2016-01-07 DIAGNOSIS — I63412 Cerebral infarction due to embolism of left middle cerebral artery: Secondary | ICD-10-CM | POA: Diagnosis not present

## 2016-01-07 DIAGNOSIS — I69351 Hemiplegia and hemiparesis following cerebral infarction affecting right dominant side: Secondary | ICD-10-CM | POA: Diagnosis not present

## 2016-01-07 DIAGNOSIS — Z9181 History of falling: Secondary | ICD-10-CM | POA: Diagnosis not present

## 2016-01-07 DIAGNOSIS — J690 Pneumonitis due to inhalation of food and vomit: Secondary | ICD-10-CM | POA: Diagnosis not present

## 2016-01-07 DIAGNOSIS — I63422 Cerebral infarction due to embolism of left anterior cerebral artery: Secondary | ICD-10-CM | POA: Diagnosis not present

## 2016-01-07 DIAGNOSIS — R29810 Facial weakness: Secondary | ICD-10-CM | POA: Diagnosis present

## 2016-01-07 DIAGNOSIS — E785 Hyperlipidemia, unspecified: Secondary | ICD-10-CM | POA: Diagnosis present

## 2016-01-07 DIAGNOSIS — R509 Fever, unspecified: Secondary | ICD-10-CM | POA: Diagnosis not present

## 2016-01-07 DIAGNOSIS — I679 Cerebrovascular disease, unspecified: Secondary | ICD-10-CM | POA: Diagnosis present

## 2016-01-07 DIAGNOSIS — J209 Acute bronchitis, unspecified: Secondary | ICD-10-CM | POA: Diagnosis not present

## 2016-01-07 DIAGNOSIS — I63322 Cerebral infarction due to thrombosis of left anterior cerebral artery: Secondary | ICD-10-CM | POA: Diagnosis not present

## 2016-01-07 DIAGNOSIS — N189 Chronic kidney disease, unspecified: Secondary | ICD-10-CM | POA: Diagnosis not present

## 2016-01-07 DIAGNOSIS — M810 Age-related osteoporosis without current pathological fracture: Secondary | ICD-10-CM | POA: Diagnosis present

## 2016-01-07 DIAGNOSIS — M79631 Pain in right forearm: Secondary | ICD-10-CM | POA: Diagnosis not present

## 2016-01-07 DIAGNOSIS — R5600 Simple febrile convulsions: Secondary | ICD-10-CM | POA: Diagnosis not present

## 2016-01-07 DIAGNOSIS — K219 Gastro-esophageal reflux disease without esophagitis: Secondary | ICD-10-CM | POA: Diagnosis not present

## 2016-01-07 DIAGNOSIS — M79621 Pain in right upper arm: Secondary | ICD-10-CM | POA: Diagnosis not present

## 2016-01-07 DIAGNOSIS — G8191 Hemiplegia, unspecified affecting right dominant side: Secondary | ICD-10-CM | POA: Diagnosis not present

## 2016-01-07 DIAGNOSIS — R4701 Aphasia: Secondary | ICD-10-CM | POA: Diagnosis not present

## 2016-01-07 DIAGNOSIS — R531 Weakness: Secondary | ICD-10-CM | POA: Diagnosis not present

## 2016-01-07 DIAGNOSIS — G309 Alzheimer's disease, unspecified: Secondary | ICD-10-CM | POA: Diagnosis not present

## 2016-01-07 DIAGNOSIS — F0280 Dementia in other diseases classified elsewhere without behavioral disturbance: Secondary | ICD-10-CM | POA: Diagnosis present

## 2016-01-07 DIAGNOSIS — K222 Esophageal obstruction: Secondary | ICD-10-CM | POA: Diagnosis present

## 2016-01-07 DIAGNOSIS — I63522 Cerebral infarction due to unspecified occlusion or stenosis of left anterior cerebral artery: Secondary | ICD-10-CM | POA: Diagnosis not present

## 2016-01-07 DIAGNOSIS — R930 Abnormal findings on diagnostic imaging of skull and head, not elsewhere classified: Secondary | ICD-10-CM | POA: Diagnosis not present

## 2016-01-07 DIAGNOSIS — Z8673 Personal history of transient ischemic attack (TIA), and cerebral infarction without residual deficits: Secondary | ICD-10-CM | POA: Diagnosis not present

## 2016-01-07 DIAGNOSIS — I63432 Cerebral infarction due to embolism of left posterior cerebral artery: Secondary | ICD-10-CM | POA: Diagnosis not present

## 2016-01-07 DIAGNOSIS — I63512 Cerebral infarction due to unspecified occlusion or stenosis of left middle cerebral artery: Secondary | ICD-10-CM | POA: Diagnosis not present

## 2016-01-07 DIAGNOSIS — F0391 Unspecified dementia with behavioral disturbance: Secondary | ICD-10-CM | POA: Diagnosis not present

## 2016-01-07 DIAGNOSIS — E8809 Other disorders of plasma-protein metabolism, not elsewhere classified: Secondary | ICD-10-CM | POA: Diagnosis not present

## 2016-01-07 DIAGNOSIS — J189 Pneumonia, unspecified organism: Secondary | ICD-10-CM | POA: Diagnosis not present

## 2016-01-07 DIAGNOSIS — R404 Transient alteration of awareness: Secondary | ICD-10-CM | POA: Diagnosis not present

## 2016-01-07 DIAGNOSIS — D72829 Elevated white blood cell count, unspecified: Secondary | ICD-10-CM | POA: Diagnosis present

## 2016-01-07 LAB — CBC WITH DIFFERENTIAL/PLATELET
Basophils Absolute: 0 10*3/uL (ref 0.0–0.1)
Basophils Relative: 0 %
Eosinophils Absolute: 0.2 10*3/uL (ref 0.0–0.7)
Eosinophils Relative: 2 %
HCT: 33.8 % — ABNORMAL LOW (ref 36.0–46.0)
HEMOGLOBIN: 11 g/dL — AB (ref 12.0–15.0)
LYMPHS PCT: 24 %
Lymphs Abs: 2.5 10*3/uL (ref 0.7–4.0)
MCH: 30 pg (ref 26.0–34.0)
MCHC: 32.5 g/dL (ref 30.0–36.0)
MCV: 92.1 fL (ref 78.0–100.0)
Monocytes Absolute: 1.3 10*3/uL — ABNORMAL HIGH (ref 0.1–1.0)
Monocytes Relative: 12 %
Neutro Abs: 6.3 10*3/uL (ref 1.7–7.7)
Neutrophils Relative %: 62 %
Platelets: 208 10*3/uL (ref 150–400)
RBC: 3.67 MIL/uL — AB (ref 3.87–5.11)
RDW: 13.6 % (ref 11.5–15.5)
WBC: 10.3 10*3/uL (ref 4.0–10.5)

## 2016-01-07 LAB — URINALYSIS, ROUTINE W REFLEX MICROSCOPIC
Bilirubin Urine: NEGATIVE
Glucose, UA: NEGATIVE mg/dL
KETONES UR: NEGATIVE mg/dL
Leukocytes, UA: NEGATIVE
Nitrite: NEGATIVE
PROTEIN: NEGATIVE mg/dL
Specific Gravity, Urine: 1.019 (ref 1.005–1.030)
pH: 6 (ref 5.0–8.0)

## 2016-01-07 LAB — COMPREHENSIVE METABOLIC PANEL
ALT: 24 U/L (ref 14–54)
AST: 44 U/L — AB (ref 15–41)
Albumin: 3.2 g/dL — ABNORMAL LOW (ref 3.5–5.0)
Alkaline Phosphatase: 82 U/L (ref 38–126)
Anion gap: 13 (ref 5–15)
BUN: 12 mg/dL (ref 6–20)
CHLORIDE: 109 mmol/L (ref 101–111)
CO2: 19 mmol/L — ABNORMAL LOW (ref 22–32)
Calcium: 8.6 mg/dL — ABNORMAL LOW (ref 8.9–10.3)
Creatinine, Ser: 1.08 mg/dL — ABNORMAL HIGH (ref 0.44–1.00)
GFR calc Af Amer: 53 mL/min — ABNORMAL LOW (ref >60)
GFR, EST NON AFRICAN AMERICAN: 46 mL/min — AB (ref >60)
Glucose, Bld: 103 mg/dL — ABNORMAL HIGH (ref 65–99)
POTASSIUM: 4.7 mmol/L (ref 3.5–5.1)
Sodium: 141 mmol/L (ref 135–145)
Total Bilirubin: 1.3 mg/dL — ABNORMAL HIGH (ref 0.3–1.2)
Total Protein: 6.1 g/dL — ABNORMAL LOW (ref 6.5–8.1)

## 2016-01-07 LAB — URINE MICROSCOPIC-ADD ON
BACTERIA UA: NONE SEEN
Squamous Epithelial / LPF: NONE SEEN
WBC, UA: NONE SEEN WBC/hpf (ref 0–5)

## 2016-01-07 LAB — I-STAT CG4 LACTIC ACID, ED: Lactic Acid, Venous: 1.73 mmol/L (ref 0.5–2.0)

## 2016-01-07 MED ORDER — ATORVASTATIN CALCIUM 40 MG PO TABS
40.0000 mg | ORAL_TABLET | Freq: Every day | ORAL | Status: DC
  Administered 2016-01-08 – 2016-01-11 (×3): 40 mg via ORAL
  Filled 2016-01-07 (×3): qty 1

## 2016-01-07 MED ORDER — SODIUM CHLORIDE 0.9 % IV BOLUS (SEPSIS)
1000.0000 mL | Freq: Once | INTRAVENOUS | Status: AC
  Administered 2016-01-07: 1000 mL via INTRAVENOUS

## 2016-01-07 MED ORDER — ENOXAPARIN SODIUM 40 MG/0.4ML ~~LOC~~ SOLN
40.0000 mg | SUBCUTANEOUS | Status: DC
  Administered 2016-01-08 – 2016-01-11 (×4): 40 mg via SUBCUTANEOUS
  Filled 2016-01-07 (×4): qty 0.4

## 2016-01-07 MED ORDER — ASPIRIN 300 MG RE SUPP
300.0000 mg | Freq: Every day | RECTAL | Status: DC
  Administered 2016-01-07 – 2016-01-11 (×2): 300 mg via RECTAL
  Filled 2016-01-07 (×2): qty 1

## 2016-01-07 MED ORDER — NAPHAZOLINE-PHENIRAMINE 0.025-0.3 % OP SOLN
1.0000 [drp] | Freq: Every day | OPHTHALMIC | Status: DC | PRN
  Filled 2016-01-07: qty 5

## 2016-01-07 MED ORDER — FLUTICASONE PROPIONATE 50 MCG/ACT NA SUSP
1.0000 | Freq: Every day | NASAL | Status: DC
  Administered 2016-01-08 – 2016-01-12 (×4): 1 via NASAL
  Filled 2016-01-07: qty 16

## 2016-01-07 MED ORDER — ASPIRIN 325 MG PO TABS
325.0000 mg | ORAL_TABLET | Freq: Every day | ORAL | Status: DC
  Administered 2016-01-08 – 2016-01-12 (×3): 325 mg via ORAL
  Filled 2016-01-07 (×3): qty 1

## 2016-01-07 MED ORDER — PANTOPRAZOLE SODIUM 40 MG PO TBEC
40.0000 mg | DELAYED_RELEASE_TABLET | Freq: Every day | ORAL | Status: DC
  Administered 2016-01-08 – 2016-01-12 (×3): 40 mg via ORAL
  Filled 2016-01-07 (×3): qty 1

## 2016-01-07 MED ORDER — STROKE: EARLY STAGES OF RECOVERY BOOK
Freq: Once | Status: DC
  Filled 2016-01-07: qty 1

## 2016-01-07 MED ORDER — SODIUM CHLORIDE 0.9 % IV SOLN
INTRAVENOUS | Status: DC
  Administered 2016-01-08 – 2016-01-10 (×3): via INTRAVENOUS

## 2016-01-07 MED ORDER — MEMANTINE HCL ER 28 MG PO CP24
28.0000 mg | ORAL_CAPSULE | Freq: Every day | ORAL | Status: DC
  Administered 2016-01-08 – 2016-01-12 (×3): 28 mg via ORAL
  Filled 2016-01-07 (×10): qty 1

## 2016-01-07 MED ORDER — LEVOTHYROXINE SODIUM 25 MCG PO TABS
25.0000 ug | ORAL_TABLET | Freq: Every day | ORAL | Status: DC
  Administered 2016-01-08 – 2016-01-12 (×4): 25 ug via ORAL
  Filled 2016-01-07 (×5): qty 1

## 2016-01-07 MED ORDER — SENNOSIDES-DOCUSATE SODIUM 8.6-50 MG PO TABS: 1.0000 | ORAL_TABLET | Freq: Every evening | ORAL | Status: DC | PRN

## 2016-01-07 NOTE — Consult Note (Addendum)
Neurology Consultation Reason for Consult: Stroke Referring Physician: Jarvis MorganNiu, X  CC: Stroke  History is obtained from: Record  HPI: Kayla SantiagoMary C Schroeder is a 82 y.o. female with a history of dementia who was last seen well apparently 2 or 3 days ago, but I do not have family to confirm this and therefore history is taken from the medical record. She was taken to [facility] 2 days ago and diagnosed with urinary tract infection. She was started on Cipro without improvement. Over the past few days she has been unable to complete her own ADLs and has had to be fed. She was therefore brought into the emergency room where UA was negative.   LKW: Unclear time of onset tpa given?: no, clear time of onset, but definitely so the window Premorbid modified rankin scale: I suspect 1 or 2 as the nurse reports that she is able to accomplish all of her ADLs prior to admission.    ROS:  Unable to obtain due to altered mental status.   Past Medical History  Diagnosis Date  . Hypertension   . Senile-onset Alzheimer's dementia with behavioral disturbance   . Verrucous keratosis   . Complete tear of right rotator cuff   . Hyperlipidemia   . Hypothyroidism   . Osteoporosis   . Chronic kidney disease, stage 3   . Keratoconjunctivitis due to Adenovirus   . GERD without esophagitis   . Noninfective gastroenteritis and colitis   . Arthritis   . Infectious colitis   . Diverticulosis   . Status post dilation of esophageal narrowing      Family History  Problem Relation Age of Onset  . Ovarian cancer Mother   . Heart disease Sister   . Irritable bowel syndrome Sister   . Colon cancer Neg Hx   . Colon polyps Neg Hx   . Diabetes Neg Hx      Social History:  reports that she has never smoked. She has never used smokeless tobacco. She reports that she does not drink alcohol or use illicit drugs.   Exam: Current vital signs: BP 137/61 mmHg  Pulse 73  Temp(Src) 99.1 F (37.3 C) (Axillary)  Resp  24  SpO2 96% Vital signs in last 24 hours: Temp:  [99.1 F (37.3 C)-99.4 F (37.4 C)] 99.1 F (37.3 C) (03/09 2200) Pulse Rate:  [73-98] 73 (03/09 2300) Resp:  [15-30] 24 (03/09 2300) BP: (132-154)/(61-84) 137/61 mmHg (03/09 2300) SpO2:  [96 %-98 %] 96 % (03/09 2300)   Physical Exam  Constitutional: Appears elderly Psych: Affect appropriate to situation Eyes: No scleral injection HENT: No OP obstrucion Head: Normocephalic.  Cardiovascular: Normal rate and regular rhythm.  Respiratory: Effort normal and breath sounds normal to anterior ascultation GI: Soft.  No distension. There is no tenderness.  Skin: WDI  Neuro: Mental Status: Patient is awake, alert, she does not follow anything other than very simple commands, wiggle toes, close eyes, does not make fist or stick out tongue. Cranial Nerves: II: Does not clearly blink to threat from the right, does appear to on the left Pupils are equal, round, and reactive to light.   III,IV, VI: EOMI without ptosis or diploplia.  V: Mrs. to stimulation bilaterally VII: Facial movement is decreased on the right VIII, X, XI, XII: Unable to assess secondary to patient's altered mental status.  Motor: Tone is normal. Bulk is normal. 5/5 strength was present on the left, she clearly has a right hemiparesis Sensory: Grimaces to pain in  all 4 extremities Cerebellar: No clear ataxia when moving the left arm  I have reviewed labs in epic and the results pertinent to this consultation are: Borderline creatinine  I have reviewed the images obtained: CT head-small area in the left concerning for subacute infarct  Impression: 82 year old female with subacute infarct. I suspect that what we see on CT is not all that we will see once an MRI is obtained. She has a right hemiparesis and aphasia which would be consistent with a sizable right MCA distribution infarction.  Recommendations: 1. HgbA1c, fasting lipid panel 2. MRI, MRA  of the brain  without contrast 3. Frequent neuro checks 4. Echocardiogram 5. Carotid dopplers 6. Prophylactic therapy-Antiplatelet med: Aspirin - dose  PO or  PR 7. Risk factor modification 8. Telemetry monitoring 9. PT consult, OT consult, Speech consult 10. please page stroke NP  Or  PA  Or MD  M-F from 8am -4 pm starting 3/10 as this patient will be followed by the stroke team at this point.   You can look them up on [email or web address]  Password [email or web address]   Kayla Slot, MD [facility] [phone]  If 7pm- 7am, please page neurology on call as listed in AMION.

## 2016-01-07 NOTE — ED Notes (Signed)
Family reports that PT's legs gave out Tuesday morning at 0300. PT's daughter arrived and reports PT's right side of mouth was "drawn up" and her right extremities were severely weak. [facility] and [facility] for U[facility]. PT has not improved. No new symptoms per family

## 2016-01-07 NOTE — ED Provider Notes (Signed)
CSN: [id number]     Arrival date & time 01/07/16  1707 History   First MD Initiated Contact with Patient 01/07/16 1714     No chief complaint on file.  LEVEL 5 CAVEAT FOR ALTERED MENTAL STATUS  (Consider location/radiation/quality/duration/timing/severity/associated sxs/prior Treatment) HPI  82 year old female brought in by EMS for weakness. Patient has a history of dementia and is unable to provide history. Daughter at the bedside provides the history. Apparently 2 days ago the patient fell. Daughter was concerned she was having a stroke because she seemed to not be using the right side of her body. She went to [facility] and was diagnosed with a UTI and given IV antibiotics and discharged with Cipro. Since then patient is still not doing well. Her baseline is that she can talk and smile and get up and walk and feed herself. Currently she is unable to get up or feed herself. She seems a little more confused than normal. Her right arm seems to be hurting her whenever anyone tries to move it which is new. Unclear if the patient hit her head. Daughter does not think that she had a CT head 2 days ago.  Past Medical History  Diagnosis Date  . Hypertension   . Senile-onset Alzheimer's dementia with behavioral disturbance   . Verrucous keratosis   . Complete tear of right rotator cuff   . Hyperlipidemia   . Hypothyroidism   . Osteoporosis   . Chronic kidney disease, stage 3   . Keratoconjunctivitis due to Adenovirus   . GERD without esophagitis   . Noninfective gastroenteritis and colitis   . Arthritis   . Infectious colitis   . Diverticulosis   . Status post dilation of esophageal narrowing    Past Surgical History  Procedure Laterality Date  . Cholecystectomy    . Abdominal hysterectomy    . Breast surgery Bilateral    Family History  Problem Relation Age of Onset  . Ovarian cancer Mother   . Heart disease Sister   . Irritable bowel syndrome Sister   . Colon cancer Neg Hx    . Colon polyps Neg Hx   . Diabetes Neg Hx    Social History  Substance Use Topics  . Smoking status: Never Smoker   . Smokeless tobacco: Never Used  . Alcohol Use: No   OB History    No data available     Review of Systems  Unable to perform ROS: Dementia      Allergies  Review of patient's allergies indicates no known allergies.  Home Medications   Prior to Admission medications   Medication Sig Start Date End Date Taking? Authorizing Provider  alendronate (FOSAMAX) 70 MG tablet Take 70 mg by mouth once a week. Take 1 tab once a week for 4 weeks.    Historical Provider, MD  fluticasone (FLOVENT DISKUS) 50 MCG/BLIST diskus inhaler Inhale 2 puffs into the lungs daily.    Historical Provider, MD  levothyroxine (SYNTHROID, LEVOTHROID) 25 MCG tablet Take 25 mcg by mouth daily before breakfast.    Historical Provider, MD  meclizine (ANTIVERT) 25 MG tablet Take 25 mg by mouth 3 (three) times daily as needed for dizziness. Take 1 tab 3 times a day as needed for 10 days.    Historical Provider, MD  memantine (NAMENDA XR) 28 MG CP24 24 hr capsule Take 28 mg by mouth. Take 1 capsule once a day for 14 days.    Historical Provider, MD  omeprazole (PRILOSEC) 40  MG capsule Take 1 capsule (40 mg total) by mouth daily. 03/26/15   Amy S Esterwood, PA-C  ondansetron (ZOFRAN) 4 MG tablet Take 1 tablet (4 mg total) by mouth every 8 (eight) hours as needed for nausea or vomiting. 03/26/15   Amy S Esterwood, PA-C  rivastigmine (EXELON) 9.5 mg/24hr Place 9.5 mg onto the skin daily. Take 1 patch once a day.    Historical Provider, MD   There were no vitals taken for this visit. Physical Exam  Constitutional: She appears well-developed and well-nourished.  HENT:  Head: Normocephalic and atraumatic.  Right Ear: External ear normal.  Left Ear: External ear normal.  Nose: Nose normal.  Eyes: Right eye exhibits no discharge. Left eye exhibits no discharge.  Cardiovascular: Normal rate, regular rhythm  and normal heart sounds.   Pulses:      Radial pulses are 2+ on the right side, and 2+ on the left side.  Pulmonary/Chest: Effort normal and breath sounds normal.  Abdominal: Soft. She exhibits no distension. There is no tenderness.  Musculoskeletal:       Right shoulder: She exhibits normal range of motion and no tenderness.       Right elbow: She exhibits decreased range of motion. She exhibits no swelling. Tenderness found.       Right upper arm: She exhibits no tenderness.       Right forearm: She exhibits no tenderness.  Neurological: She is alert. She is disoriented.  Patient does not speak. Occasionally laughs. Grips hands and moves feet on command. Strength appears equal bilaterally  Skin: Skin is warm and dry.  Nursing note and vitals reviewed.   ED Course  Procedures (including critical care time) Labs Review Labs Reviewed  URINALYSIS, ROUTINE W REFLEX MICROSCOPIC (NOT AT [facility]) - Abnormal; Notable for the following:    Hgb urine dipstick SMALL (*)    All other components within normal limits  COMPREHENSIVE METABOLIC PANEL - Abnormal; Notable for the following:    CO2 19 (*)    Glucose, Bld 103 (*)    Creatinine, Ser 1.08 (*)    Calcium 8.6 (*)    Total Protein 6.1 (*)    Albumin 3.2 (*)    AST 44 (*)    Total Bilirubin 1.3 (*)    GFR calc non Af Amer 46 (*)    GFR calc Af Amer 53 (*)    All other components within normal limits  CBC WITH DIFFERENTIAL/PLATELET - Abnormal; Notable for the following:    RBC 3.67 (*)    Hemoglobin 11.0 (*)    HCT 33.8 (*)    Monocytes Absolute 1.3 (*)    All other components within normal limits  URINE MICROSCOPIC-ADD ON - Abnormal; Notable for the following:    Casts HYALINE CASTS (*)    All other components within normal limits  URINE CULTURE  PROTIME-INR  APTT  HEMOGLOBIN A1C  LIPID PANEL  TSH  I-STAT CG4 LACTIC ACID, ED    Imaging Review Dg Elbow Complete Right  01/07/2016  CLINICAL DATA:  Pain when palpated by MD on  right shoulder, elbow and forearm. Pt. Could not localize any pain. However expressed pain upon movement of this right arm. Weakness noted on entire right side of body with facial droop x 2 days. EXAM: RIGHT ELBOW - COMPLETE 3+ VIEW COMPARISON:  Humerus same day FINDINGS: There is no evidence of fracture, dislocation, or joint effusion. There is no evidence of arthropathy or other focal bone abnormality. Soft  tissues are unremarkable. IMPRESSION: Negative. Electronically Signed   By: Norva Pavlov M.D.   On: 01/07/2016 19:14   Dg Forearm Right  01/07/2016  CLINICAL DATA:  Pain EXAM: RIGHT FOREARM - 2 VIEW COMPARISON:  None. FINDINGS: There is diffuse osteopenia which limits characterization of osseous detail. No acute or suspicious osseous lesions are identified. No fracture line or displaced fracture fragment seen. Soft tissues about the right forearm are unremarkable. No significant degenerative change appreciated. IMPRESSION: Osteopenia.  No acute findings. Electronically Signed   By: Bary Richard M.D.   On: 01/07/2016 19:10   Ct Head Wo Contrast  01/07/2016  CLINICAL DATA:  Pt with onset of AMS.Pts daughter reports right side of mouth drawn up and extremities weak. EXAM: CT HEAD WITHOUT CONTRAST TECHNIQUE: Contiguous axial images were obtained from the base of the skull through the vertex without intravenous contrast. COMPARISON:  11/22/2013 FINDINGS: There is significant central cortical atrophy. Periventricular white matter changes are consistent with small vessel disease. New low-attenuation identified within the periventricular posterior left frontal region to represent a subacute infarct. There is no associated hemorrhage or significant mass effect. IMPRESSION: 1. Atrophy and small vessel disease. 2. New low-attenuation in the posterior left frontal lobe consistent with subacute or chronic ischemic changes. No associated hemorrhage. The salient findings were discussed with Ivanell Deshotel on 01/07/2016  at 7:26 pm. Electronically Signed   By: Norva Pavlov M.D.   On: 01/07/2016 19:28   Dg Humerus Right  01/07/2016  CLINICAL DATA:  Pain when palpated by MD on right shoulder, elbow and forearm. Pt. Could not localize any pain. However expressed pain upon movement of this right arm. Weakness noted on entire right side of body with facial droop x 2 days. EXAM: RIGHT HUMERUS - 2+ VIEW COMPARISON:  None. FINDINGS: There is no evidence of fracture or other focal bone lesions. Soft tissues are unremarkable. IMPRESSION: Negative. Electronically Signed   By: Norva Pavlov M.D.   On: 01/07/2016 19:09   I have personally reviewed and evaluated these images and lab results as part of my medical decision-making.   EKG Interpretation None      MDM   Final diagnoses:  Stroke (cerebrum) (HCC)  Stroke (cerebrum) (HCC)  Stroke (cerebrum) (HCC)    Patient is a probably not acting at her normal. She is unable to get up on her own anymore. CT shows age-indeterminate stroke but is location could correlate with right-sided symptoms. Difficult to assess strength given the patient only squeezes hands and doesn't follow commands very well. However with right-sided "pain" it could be that her right arm is weak and numb. Will admit to the hospitalist for further workup and care. Neurology has been consulted as well. No focal fractures seen on x-rays. No obvious UTI but is currently on antibiotics.    Pricilla Loveless, MD 01/08/16 [phone]

## 2016-01-07 NOTE — ED Notes (Signed)
Elease HashimotoPatricia, RN accepts report

## 2016-01-07 NOTE — Progress Notes (Signed)
Pt arrived on unit disoriented to self, situation, time, place. Pt nods comprehension is questionable. Pt is restless, no grimacing, no moaning. Bladder palpable, LLQ. RN wil bladder scan Pt. Call light placed within Pt reach, RN will continue to monitor.

## 2016-01-07 NOTE — H&P (Signed)
[facility] History and Physical  Kayla SantiagoMary C Schroeder ZOX:[id number] DOB: 30-Mar-1931 DOA: 01/07/2016  Referring physician: ED physician PCP: Abigail MiyamotoPERRY,Kayla EDWARD, MD  Specialists:   Chief Complaint: Right-sided weakness  HPI: Kayla SantiagoMary C Schroeder is a 82 y.o. female with PMH of hypertension, hyperlipidemia, GERD, hypothyroidism, dementia, chronic kidney disease-stage III, colitis, esophageal stricture (S/P dilation), who presents with right-sided weakness.  Patient has history of dementia and is unable to provide history. Daughter at the bedside provides the history. Pt developed right sided weakness 2 days ago and had fall. [facility] and was diagnosed with UTI and given IV antibiotics and discharged with Cipro. She did not have dysuria, burning on urination or increased urinary frequency per daughter. Patient has been taking ciprofloxacin, but without improvement.  Her baseline is that she can talk and smile and get up and walk and feed herself. Currently she is unable to get up or feed herself. She seems a little more confused than normal. Her right arm seems to be hurting. Unclear if the patient hit her head. Per daughter, patient does not seem to have chest pain, shortness breath, cough, nausea, vomiting, abdominal pain, diarrhea.  In ED, patient was found to have negative urinalysis, lactate 1.73, WBC 10.3, temperature 99.4, no tachycardia, electrolytes okay, creatinine 1.08, x-ray of her right elbow, right forearm and right humerus are negative for bony abnormalities. CT head showed atrophy and small vessel disease. New low-attenuation in the posterior left frontal lobe consistent with subacute or chronic ischemic changes. No associated hemorrhage. Patient is admitted to inpatient for further evaluation and treatment. Neurology will be consulted by EDP.  EKG: Not done in ED, will get one.   Where does patient live?   At home    Can patient participate in ADLs? None  Review of  Systems: Could not be accurately reviewed due to dementia  Allergy: No Known Allergies  Past Medical History  Diagnosis Date  . Hypertension   . Senile-onset Alzheimer's dementia with behavioral disturbance   . Verrucous keratosis   . Complete tear of right rotator cuff   . Hyperlipidemia   . Hypothyroidism   . Osteoporosis   . Chronic kidney disease, stage 3   . Keratoconjunctivitis due to Adenovirus   . GERD without esophagitis   . Noninfective gastroenteritis and colitis   . Arthritis   . Infectious colitis   . Diverticulosis   . Status post dilation of esophageal narrowing     Past Surgical History  Procedure Laterality Date  . Cholecystectomy    . Abdominal hysterectomy    . Breast surgery Bilateral     Social History:  reports that she has never smoked. She has never used smokeless tobacco. She reports that she does not drink alcohol or use illicit drugs.  Family History:  Family History  Problem Relation Age of Onset  . Ovarian cancer Mother   . Heart disease Sister   . Irritable bowel syndrome Sister   . Colon cancer Neg Hx   . Colon polyps Neg Hx   . Diabetes Neg Hx      Prior to Admission medications   Medication Sig Start Date End Date Taking? Authorizing Provider  ciprofloxacin (CIPRO) 500 MG tablet Take 500 mg by mouth 2 (two) times daily. Started on 01-05-16   Yes Historical Provider, MD  fluticasone (FLONASE) 50 MCG/ACT nasal spray Place 1 spray into both nostrils daily.   Yes Historical Provider, MD  levothyroxine (SYNTHROID, LEVOTHROID) 25 MCG tablet  Take 25 mcg by mouth daily before breakfast.   Yes Historical Provider, MD  memantine (NAMENDA XR) 28 MG CP24 24 hr capsule Take 28 mg by mouth. Take 1 capsule once a day for 14 days.   Yes Historical Provider, MD  naphazoline-pheniramine (NAPHCON-A) 0.025-0.3 % ophthalmic solution Place 1 drop into both eyes daily as needed for irritation.   Yes Historical Provider, MD  omeprazole (PRILOSEC) 40 MG capsule  Take 1 capsule (40 mg total) by mouth daily. 03/26/15  Yes Amy S Esterwood, PA-C  rivastigmine (EXELON) 9.5 mg/24hr Place 9.5 mg onto the skin daily. Take 1 patch once a day.   Yes Historical Provider, MD  ondansetron (ZOFRAN) 4 MG tablet Take 1 tablet (4 mg total) by mouth every 8 (eight) hours as needed for nausea or vomiting. Patient not taking: Reported on 01/07/2016 03/26/15   Sammuel Cooper, PA-C    Physical Exam: Filed Vitals:   01/07/16 1751 01/07/16 1800 01/07/16 1815 01/07/16 1930  BP: 140/84 133/68 132/67 150/69  Pulse: 90 83 84 83  Temp: 99.4 F (37.4 C)     TempSrc: Rectal     Resp: SpO2: 98% 98% 97% 97%   General: Not in acute distress HEENT:       Eyes: PERRL, EOMI, no scleral icterus.       ENT: No discharge from the ears and nose, no pharynx injection, no tonsillar enlargement.        Neck: No JVD, no bruit, no mass felt. Heme: No neck lymph node enlargement. Cardiac: S1/S2, RRR, No murmurs, No gallops or rubs. Pulm: No rales, wheezing, rhonchi or rubs. Abd: Soft, nondistended, nontender, no rebound pain, no organomegaly, BS present. Ext: No pitting leg edema bilaterally. 2+DP/PT pulse bilaterally. Musculoskeletal: pt seems to have tenderness over right arm on exam. Skin: No rashes.  Neuro: No oriented X3, cranial nerves II-XII grossly intact. Muscle strength 2/5 in right leg and 3/5 in right arm, Knee reflex 1+ bilaterally. Negative Babinski's sign.  Psych: Patient is not psychotic, no suicidal or hemocidal ideation.  Labs on Admission:  Basic Metabolic Panel:  Recent Labs Lab 01/07/16 1824  NA 141  K 4.7  CL 109  CO2 19*  GLUCOSE 103*  BUN 12  CREATININE 1.08*  CALCIUM 8.6*   Liver Function Tests:  Recent Labs Lab 01/07/16 1824  AST 44*  ALT 24  ALKPHOS 82  BILITOT 1.3*  PROT 6.1*  ALBUMIN 3.2*   No results for input(s): LIPASE, AMYLASE in the last 168 hours. No results for input(s): AMMONIA in the last 168  hours. CBC:  Recent Labs Lab 01/07/16 1824  WBC 10.3  NEUTROABS 6.3  HGB 11.0*  HCT 33.8*  MCV 92.1  PLT 208   Cardiac Enzymes: No results for input(s): CKTOTAL, CKMB, CKMBINDEX, TROPONINI in the last 168 hours.  BNP (last 3 results) No results for input(s): BNP in the last 8760 hours.  ProBNP (last 3 results) No results for input(s): PROBNP in the last 8760 hours.  CBG: No results for input(s): GLUCAP in the last 168 hours.  Radiological Exams on Admission: Dg Elbow Complete Right  01/07/2016  CLINICAL DATA:  Pain when palpated by MD on right shoulder, elbow and forearm. Pt. Could not localize any pain. However expressed pain upon movement of this right arm. Weakness noted on entire right side of body with facial droop x 2 days. EXAM: RIGHT ELBOW - COMPLETE 3+ VIEW COMPARISON:  Humerus same day  FINDINGS: There is no evidence of fracture, dislocation, or joint effusion. There is no evidence of arthropathy or other focal bone abnormality. Soft tissues are unremarkable. IMPRESSION: Negative. Electronically Signed   By: Norva Pavlov M.D.   On: 01/07/2016 19:14   Dg Forearm Right  01/07/2016  CLINICAL DATA:  Pain EXAM: RIGHT FOREARM - 2 VIEW COMPARISON:  None. FINDINGS: There is diffuse osteopenia which limits characterization of osseous detail. No acute or suspicious osseous lesions are identified. No fracture line or displaced fracture fragment seen. Soft tissues about the right forearm are unremarkable. No significant degenerative change appreciated. IMPRESSION: Osteopenia.  No acute findings. Electronically Signed   By: Bary Richard M.D.   On: 01/07/2016 19:10   Ct Head Wo Contrast  01/07/2016  CLINICAL DATA:  Pt with onset of AMS.Pts daughter reports right side of mouth drawn up and extremities weak. EXAM: CT HEAD WITHOUT CONTRAST TECHNIQUE: Contiguous axial images were obtained from the base of the skull through the vertex without intravenous contrast. COMPARISON:  11/22/2013  FINDINGS: There is significant central cortical atrophy. Periventricular white matter changes are consistent with small vessel disease. New low-attenuation identified within the periventricular posterior left frontal region to represent a subacute infarct. There is no associated hemorrhage or significant mass effect. IMPRESSION: 1. Atrophy and small vessel disease. 2. New low-attenuation in the posterior left frontal lobe consistent with subacute or chronic ischemic changes. No associated hemorrhage. The salient findings were discussed with SCOTT GOLDSTON on 01/07/2016 at 7:26 pm. Electronically Signed   By: Norva Pavlov M.D.   On: 01/07/2016 19:28   Dg Humerus Right  01/07/2016  CLINICAL DATA:  Pain when palpated by MD on right shoulder, elbow and forearm. Pt. Could not localize any pain. However expressed pain upon movement of this right arm. Weakness noted on entire right side of body with facial droop x 2 days. EXAM: RIGHT HUMERUS - 2+ VIEW COMPARISON:  None. FINDINGS: There is no evidence of fracture or other focal bone lesions. Soft tissues are unremarkable. IMPRESSION: Negative. Electronically Signed   By: Norva Pavlov M.D.   On: 01/07/2016 19:09    Assessment/Plan Principal Problem:   Stroke (cerebrum) (HCC) Active Problems:   Alzheimer's dementia   GERD (gastroesophageal reflux disease)   HTN (hypertension)   CKD (chronic kidney disease), stage III   UTI (lower urinary tract infection)  Stroke (cerebrum) (HCC): as evidenced by CT-head. Pt is out of tPA window.   - will admit to tele bed - Neurology will be consulted by EDP, will follow up recommendations. - Risk factor modification: HgbA1c, fasting lipid panel  - MRI, MRA of the brain without contrast  - PT consult, OT consult - Speech consult  - 2 d Echocardiogram  - Ekg  - Carotid dopplers  - Aspirin - start lipitor 40 mg daily  HLD: Last LDL was not on record. Patient is not taking medications at home.  -Start Lipitor   -Check FLP  GERD: -Protonix  Hypothyroidism: Last TSH was not on record -Continue home Synthroid -Check TSH  CKD (chronic kidney disease), stage III: cre 1.08 -f/u renal fx by BMP  HTN; blood pressure 150/69. Patient is not taking medications at home. -Hold blood pressure medications in the setting of stroke.   Questionable UTI (lower urinary tract infection): Urinalysis is negative. Patient did not have symptoms for UTI. -Hold antibiotics -Follow-up urine culture   DVT ppx: SQ Lovenox  Code Status: Full code Family Communication:  Yes, patient's daughter and  son-in law  at bed side Disposition Plan: Admit to inpatient   Date of Service 01/07/2016    Lorretta Harp [facility] Pager [phone]  If 7PM-7AM, please contact night-coverage [email or web address] Password [facility] 01/07/2016, 9:24 PM

## 2016-01-08 ENCOUNTER — Inpatient Hospital Stay (HOSPITAL_COMMUNITY): Payer: Medicare Other

## 2016-01-08 DIAGNOSIS — I639 Cerebral infarction, unspecified: Secondary | ICD-10-CM | POA: Diagnosis present

## 2016-01-08 DIAGNOSIS — I6789 Other cerebrovascular disease: Secondary | ICD-10-CM

## 2016-01-08 LAB — APTT: APTT: 25 s (ref 24–37)

## 2016-01-08 LAB — LIPID PANEL
CHOL/HDL RATIO: 5.4 ratio
CHOLESTEROL: 201 mg/dL — AB (ref 0–200)
HDL: 37 mg/dL — ABNORMAL LOW (ref >40)
LDL CALC: 121 mg/dL — AB (ref 0–99)
TRIGLYCERIDES: 215 mg/dL — AB (ref <150)
VLDL: 43 mg/dL — AB (ref 0–40)

## 2016-01-08 LAB — GLUCOSE, CAPILLARY: Glucose-Capillary: 119 mg/dL — ABNORMAL HIGH (ref 65–99)

## 2016-01-08 LAB — ECHOCARDIOGRAM COMPLETE

## 2016-01-08 LAB — PROTIME-INR
INR: 1.06 (ref 0.00–1.49)
Prothrombin Time: 14 seconds (ref 11.6–15.2)

## 2016-01-08 LAB — TSH: TSH: 4.258 u[IU]/mL (ref 0.350–4.500)

## 2016-01-08 NOTE — Progress Notes (Signed)
  Echocardiogram 2D Echocardiogram has been performed.  Janalyn HarderWest, Jaydi Bray R 01/08/2016, 11:55 AM
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
## 2016-01-08 NOTE — Progress Notes (Signed)
[facility] PROGRESS NOTE  [name] ZHY:[id number] DOB: 05-Dec-1930 DOA: 01/07/2016 PCP: Abigail Miyamoto, MD  Brief narrative 82 year old female with history of hypertension, TIA, Alzheimer's dementia, she daily stage III, GERD, hypothyroidism . Patient had a fall at home 3 days prior to admission and was taken to [facility] ED where she was diagnosed with UTI and given IV antibiotics and then discharged on oral ciprofloxacin. However symptoms did not improve. (At baseline patient communicates and is able to ambulate in and around the house and feed herself). Patient appeared to be more confused than usual and unable to get out of bed or feet herself. Family called her PCP and then brought her to [facility] ED. In the ED her vitals were stable. Blood work showed WBC of 10.3, lactate of 1.73, UA was normal, creatinine of 1.08. X-ray of the right elbow, right forearm and right humerus were negative for any fractures. Head CT showed atrophy and a new low-attenuation in the posterior left frontal lobe consistent with subacute stroke. Admitted to hospital service for stroke workup and neurology consulted. Since symptoms had occurred at least 72 hours prior to presentation patient was out of therapeutic window for TPA.   Assessment/Plan: Subacute left posterior frontal lobe infarct Has residual right hemiparesis and worsening confusion. Suspect embolic stroke. MRI brain shows small-to-moderate volume of acute infarction in the high left frontal lobe predominantly involving left ACA territory. Also has moderate to severe proximal left A2 ACA stenosis , bilateral proximal PCA and severe left MCA branch vessel stenosis. Also shows a chronic pontine infarct. carotid Doppler and 2-D echo pending. LDL of 121. A1c pending. Seen by speech and swallow and recommend regular diet with thin liquids. Administer whole medications with puree.  Continue neuro checks. Stroking following. Given her dementia and  fall risk she is not a candidate for correlation. Started on full dose aspirin and Lipitor. Allow permissive blood pressure. -PT/OT evaluation.  GERD Continue Protonix    hypothyroidism  continue Synthroid. Check TSH.  CK D stage III Renal function stable.  Alzheimer's dementia  DVT prophylaxis: Subcutaneous Lovenox Diet: Heart healthy  Code Status: Full code Family Communication: Order at bedside Disposition Plan: Pending further workup.   Consultants:  Neurology  Procedures:  CT head  MRI brain/MRA head  2-D echo  Carotid Doppler  Antibiotics:  None  HPI/Subjective: Seen and examined. Admission H&P reviewed.  Objective: Filed Vitals:   01/08/16 1018 01/08/16 1309  BP: 138/66 120/97  Pulse: 79 82  Temp: 97.9 F (36.6 C) 98.2 F (36.8 C)  Resp: 18 18    Intake/Output Summary (Last 24 hours) at 01/08/16 1318 Last data filed at 01/08/16 0800  Gross per 24 hour  Intake      0 ml  Output      0 ml  Net      0 ml   There were no vitals filed for this visit.  Exam:   General:  Elderly female lying in bed, nonverbal  HEENT: No pallor, moist mucosa, left facial droop  Chest: Clear bilaterally  Cardiovascular: Normal S1 and S2, no murmurs rub or gallop  Abdomen: , Nondistended, nontender, bowel sounds present  Musculoskeletal: Warm, no edema   CNS: Nonverbal, flat affect,left  facial droop , right hemiparesis  Data Reviewed: Basic Metabolic Panel:  Recent Labs Lab 01/07/16 1824  NA 141  K 4.7  CL 109  CO2 19*  GLUCOSE 103*  BUN 12  CREATININE 1.08*  CALCIUM 8.6*  Liver Function Tests:  Recent Labs Lab 01/07/16 1824  AST 44*  ALT 24  ALKPHOS 82  BILITOT 1.3*  PROT 6.1*  ALBUMIN 3.2*   No results for input(s): LIPASE, AMYLASE in the last 168 hours. No results for input(s): AMMONIA in the last 168 hours. CBC:  Recent Labs Lab 01/07/16 1824  WBC 10.3  NEUTROABS 6.3  HGB 11.0*  HCT 33.8*  MCV 92.1  PLT 208    Cardiac Enzymes: No results for input(s): CKTOTAL, CKMB, CKMBINDEX, TROPONINI in the last 168 hours. BNP (last 3 results) No results for input(s): BNP in the last 8760 hours.  ProBNP (last 3 results) No results for input(s): PROBNP in the last 8760 hours.  CBG: No results for input(s): GLUCAP in the last 168 hours.  Recent Results (from the past 240 hour(s))  Urine culture     Status: None (Preliminary result)   Collection Time: 01/07/16  8:06 PM  Result Value Ref Range Status   Specimen Description URINE, CATHETERIZED  Final   Special Requests NONE  Final   Culture NO GROWTH < 24 HOURS  Final   Report Status PENDING  Incomplete     Studies: Dg Elbow Complete Right  01/07/2016  CLINICAL DATA:  Pain when palpated by MD on right shoulder, elbow and forearm. Pt. Could not localize any pain. However expressed pain upon movement of this right arm. Weakness noted on entire right side of body with facial droop x 2 days. EXAM: RIGHT ELBOW - COMPLETE 3+ VIEW COMPARISON:  Humerus same day FINDINGS: There is no evidence of fracture, dislocation, or joint effusion. There is no evidence of arthropathy or other focal bone abnormality. Soft tissues are unremarkable. IMPRESSION: Negative. Electronically Signed   By: Norva Pavlov M.D.   On: 01/07/2016 19:14   Dg Forearm Right  01/07/2016  CLINICAL DATA:  Pain EXAM: RIGHT FOREARM - 2 VIEW COMPARISON:  None. FINDINGS: There is diffuse osteopenia which limits characterization of osseous detail. No acute or suspicious osseous lesions are identified. No fracture line or displaced fracture fragment seen. Soft tissues about the right forearm are unremarkable. No significant degenerative change appreciated. IMPRESSION: Osteopenia.  No acute findings. Electronically Signed   By: Bary Richard M.D.   On: 01/07/2016 19:10   Ct Head Wo Contrast  01/07/2016  CLINICAL DATA:  Pt with onset of AMS.Pts daughter reports right side of mouth drawn up and extremities  weak. EXAM: CT HEAD WITHOUT CONTRAST TECHNIQUE: Contiguous axial images were obtained from the base of the skull through the vertex without intravenous contrast. COMPARISON:  11/22/2013 FINDINGS: There is significant central cortical atrophy. Periventricular white matter changes are consistent with small vessel disease. New low-attenuation identified within the periventricular posterior left frontal region to represent a subacute infarct. There is no associated hemorrhage or significant mass effect. IMPRESSION: 1. Atrophy and small vessel disease. 2. New low-attenuation in the posterior left frontal lobe consistent with subacute or chronic ischemic changes. No associated hemorrhage. The salient findings were discussed with SCOTT GOLDSTON on 01/07/2016 at 7:26 pm. Electronically Signed   By: Norva Pavlov M.D.   On: 01/07/2016 19:28   Mr Brain Wo Contrast  01/08/2016  CLINICAL DATA:  Stroke.  Right hemiparesis and aphasia. EXAM: MRI HEAD WITHOUT CONTRAST MRA HEAD WITHOUT CONTRAST TECHNIQUE: Multiplanar, multiecho pulse sequences of the brain and surrounding structures were obtained without intravenous contrast. Angiographic images of the head were obtained using MRA technique without contrast. COMPARISON:  Head CT 01/07/2016 FINDINGS: MRI  HEAD FINDINGS Multiple areas of restricted diffusion are seen in the left frontal lobe consistent with acute infarcts. The most confluent area is small to moderate in size and involves the posteromedial left frontal lobe, cingulate gyrus, and body of the corpus callosum in the distal ACA distribution. Additional smaller foci of acute cortical and subcortical infarction are noted more anteriorly and laterally in the left frontal lobe, possibly ACA/ MCA watershed. There is associated cytotoxic edema without evidence of hemorrhage or mass effect. There is moderate cerebral atrophy. A small, chronic infarct is noted in the left paracentral pons. Patchy to confluent T2  hyperintensities in the subcortical and deep cerebral white matter and brainstem are compatible with moderate chronic small vessel ischemic disease. Chronic small vessel ischemic changes are also noted in the basal ganglia bilaterally. Prior bilateral cataract extraction is noted. There are small right and trace left mastoid effusions. No significant paranasal sinus disease. Major intracranial vascular flow voids are preserved. MRA HEAD FINDINGS The visualized distal vertebral arteries are patent without stenosis. Left vertebral artery is mildly dominant. PICA, AICA, and SCA origins are patent. Basilar artery is patent without stenosis. There is a fetal type origin of the left PCA. A right posterior communicating artery is not identified. There is a severe right PCA stenosis near the P1 - P2 junction. There is also moderate to severe narrowing of the proximal left P2 segment. Moderate PCA branch vessel irregularity and narrowing are present bilaterally. Internal carotid arteries are patent from skullbase to carotid termini without stenosis. Slightly bulbous appearance of the left posterior communicating artery origin likely represents an infundibulum. MCAs are patent without evidence of significant proximal stenosis. There is asymmetric irregular left MCA branch vessel narrowing including severe focal stenoses involving distal M2 and M3 inferior division branch vessels. The A1 segments are patent without stenosis. The right A2 segment is occluded proximally. There is a moderate to severe stenosis of the proximal left A2 segment. More distal left A2 and proximal A3 segments appear heavily diseased with tandem severe stenoses and diminished flow related enhancement. The MRA does not extend superiorly to include most of the area of acute infarction. No intracranial aneurysm is identified. IMPRESSION: 1. Small to moderate volume of acute infarction in the high left frontal lobe predominantly involving the ACA territory.  2. Moderate to severe proximal left A2 ACA stenosis. Severe tandem stenoses with decreased flow more distally in the left ACA. 3. Right ACA proximal A2 occlusion. 4. Severe bilateral proximal PCA stenoses and severe left MCA branch vessel stenoses. 5. Moderate chronic small vessel ischemic disease. 6. Chronic pontine infarct. Electronically Signed   By: Sebastian Ache M.D.   On: 01/08/2016 13:05   Dg Humerus Right  01/07/2016  CLINICAL DATA:  Pain when palpated by MD on right shoulder, elbow and forearm. Pt. Could not localize any pain. However expressed pain upon movement of this right arm. Weakness noted on entire right side of body with facial droop x 2 days. EXAM: RIGHT HUMERUS - 2+ VIEW COMPARISON:  None. FINDINGS: There is no evidence of fracture or other focal bone lesions. Soft tissues are unremarkable. IMPRESSION: Negative. Electronically Signed   By: Norva Pavlov M.D.   On: 01/07/2016 19:09   Mr Maxine Glenn Head/brain Wo Cm  01/08/2016  CLINICAL DATA:  Stroke.  Right hemiparesis and aphasia. EXAM: MRI HEAD WITHOUT CONTRAST MRA HEAD WITHOUT CONTRAST TECHNIQUE: Multiplanar, multiecho pulse sequences of the brain and surrounding structures were obtained without intravenous contrast. Angiographic images of the  head were obtained using MRA technique without contrast. COMPARISON:  Head CT 01/07/2016 FINDINGS: MRI HEAD FINDINGS Multiple areas of restricted diffusion are seen in the left frontal lobe consistent with acute infarcts. The most confluent area is small to moderate in size and involves the posteromedial left frontal lobe, cingulate gyrus, and body of the corpus callosum in the distal ACA distribution. Additional smaller foci of acute cortical and subcortical infarction are noted more anteriorly and laterally in the left frontal lobe, possibly ACA/ MCA watershed. There is associated cytotoxic edema without evidence of hemorrhage or mass effect. There is moderate cerebral atrophy. A small, chronic infarct  is noted in the left paracentral pons. Patchy to confluent T2 hyperintensities in the subcortical and deep cerebral white matter and brainstem are compatible with moderate chronic small vessel ischemic disease. Chronic small vessel ischemic changes are also noted in the basal ganglia bilaterally. Prior bilateral cataract extraction is noted. There are small right and trace left mastoid effusions. No significant paranasal sinus disease. Major intracranial vascular flow voids are preserved. MRA HEAD FINDINGS The visualized distal vertebral arteries are patent without stenosis. Left vertebral artery is mildly dominant. PICA, AICA, and SCA origins are patent. Basilar artery is patent without stenosis. There is a fetal type origin of the left PCA. A right posterior communicating artery is not identified. There is a severe right PCA stenosis near the P1 - P2 junction. There is also moderate to severe narrowing of the proximal left P2 segment. Moderate PCA branch vessel irregularity and narrowing are present bilaterally. Internal carotid arteries are patent from skullbase to carotid termini without stenosis. Slightly bulbous appearance of the left posterior communicating artery origin likely represents an infundibulum. MCAs are patent without evidence of significant proximal stenosis. There is asymmetric irregular left MCA branch vessel narrowing including severe focal stenoses involving distal M2 and M3 inferior division branch vessels. The A1 segments are patent without stenosis. The right A2 segment is occluded proximally. There is a moderate to severe stenosis of the proximal left A2 segment. More distal left A2 and proximal A3 segments appear heavily diseased with tandem severe stenoses and diminished flow related enhancement. The MRA does not extend superiorly to include most of the area of acute infarction. No intracranial aneurysm is identified. IMPRESSION: 1. Small to moderate volume of acute infarction in the  high left frontal lobe predominantly involving the ACA territory. 2. Moderate to severe proximal left A2 ACA stenosis. Severe tandem stenoses with decreased flow more distally in the left ACA. 3. Right ACA proximal A2 occlusion. 4. Severe bilateral proximal PCA stenoses and severe left MCA branch vessel stenoses. 5. Moderate chronic small vessel ischemic disease. 6. Chronic pontine infarct. Electronically Signed   By: Sebastian AcheAllen  Grady M.D.   On: 01/08/2016 13:05    Scheduled Meds: .  stroke: mapping our early stages of recovery book   Does not apply Once  . aspirin  300 mg Rectal Daily   Or  . aspirin  325 mg Oral Daily  . atorvastatin  40 mg Oral q1800  . enoxaparin (LOVENOX) injection  40 mg Subcutaneous Q24H  . fluticasone  1 spray Each Nare Daily  . levothyroxine  25 mcg Oral QAC breakfast  . memantine  28 mg Oral Daily  . pantoprazole  40 mg Oral Daily   Continuous Infusions: . sodium chloride 75 mL/hr at 01/08/16 0539      Time spent: 25 minutes    Eddie NorthDHUNGEL, [name]  [facility] Pager [phone] If 7PM-7AM,  please contact night-coverage at [email or web address], password [facility] 01/08/2016, 1:18 PM  LOS: 1 day

## 2016-01-08 NOTE — Progress Notes (Signed)
OT Cancellation Note  Patient Details Name: Kayla SantiagoMary C Schroeder MRN: [id number] DOB: [birth year]   Cancelled Treatment:    Reason Eval/Treat Not Completed: Patient at procedure or test/ unavailable -- currently off the unit. Will reattempt OT evaluation at a later date/time.  Emine Lopata, Dorna MaiLeila A 01/08/2016, 12:35 PM

## 2016-01-08 NOTE — Care Management Note (Signed)
Case Management Note  Patient Details  Name: [name] MRN: [id number] Date of Birth: June 15, 1931  Subjective/Objective:                    Action/Plan: Patient presented with right sided weakness, increased confusion.  Recent UTI (treated) and recent fall.  Will follow for discharge needs pending PT/OT evals and physician orders.  Expected Discharge Date:                  Expected Discharge Plan:     In-House Referral:     Discharge planning Services     Post Acute Care Choice:    Choice offered to:     DME Arranged:    DME Agency:     HH Arranged:    HH Agency:     Status of Service:  In process, will continue to follow  Medicare Important Message Given:    Date Medicare IM Given:    Medicare IM give by:    Date Additional Medicare IM Given:    Additional Medicare Important Message give by:     If discussed at Long Length of Stay Meetings, dates discussed:    Additional Comments:  Anda KraftRobarge, Flor Whitacre C, RN 01/08/2016, 10:44 AM [phone]

## 2016-01-08 NOTE — Evaluation (Signed)
Clinical/Bedside Swallow Evaluation Patient Details  Name: [name] MRN: [id number] Date of Birth: 1931-08-05  Today's Date: 01/08/2016 Time: SLP Start Time (ACUTE ONLY): 1107 SLP Stop Time (ACUTE ONLY): 1117 SLP Time Calculation (min) (ACUTE ONLY): 10 min  Past Medical History:  Past Medical History  Diagnosis Date  . Hypertension   . Senile-onset Alzheimer's dementia with behavioral disturbance   . Verrucous keratosis   . Complete tear of right rotator cuff   . Hyperlipidemia   . Hypothyroidism   . Osteoporosis   . Chronic kidney disease, stage 3   . Keratoconjunctivitis due to Adenovirus   . GERD without esophagitis   . Noninfective gastroenteritis and colitis   . Arthritis   . Infectious colitis   . Diverticulosis   . Status post dilation of esophageal narrowing    Past Surgical History:  Past Surgical History  Procedure Laterality Date  . Cholecystectomy    . Abdominal hysterectomy    . Breast surgery Bilateral    HPI:  82 y.o. female with history of ypertension, hyperlipidemia, GERD, hypothyroidism, dementia chronic kidney disease stage III, colitis, and esophageal stricture status post dilatation presenting with 2-3 days of right-sided weakness with fall at home.  CT subacute dominant left posterior frontal lobe infarct.  04/02/15 esophagram revealed severe esophageal motility disorder, with extensive tertiary contractions.   Assessment / Plan / Recommendation Clinical Impression  Pt presents with quite functional swallow marked by adequate mastication, brisk swallow response, and no overt s/s of aspiration with a range of consistencies.  Daughter present for assessment; states that pt had good appetite in general PTA.  Recommend advancing diet to a regular consistency with thin liquids; give meds whole in puree.   No SLP f/u.    Aspiration Risk  No limitations    Diet Recommendation   regular, thin liquids.   Medication Administration: Whole meds with puree     Other  Recommendations Oral Care Recommendations: Oral care BID   Follow up Recommendations  None    Frequency and Duration            Prognosis        Swallow Study   General Date of Onset: 01/07/16 HPI: 82 y.o. female with history of ypertension, hyperlipidemia, GERD, hypothyroidism, dementia chronic kidney disease stage III, colitis, and esophageal stricture status post dilatation presenting with 2-3 days of right-sided weakness with fall at home.  CT subacute dominant left posterior frontal lobe infarct.  04/02/15 esophagram revealed severe esophageal motility disorder, with extensive tertiary Type of Study: Bedside Swallow Evaluation Diet Prior to this Study: NPO Temperature Spikes Noted: No Respiratory Status: Room air History of Recent Intubation: No Behavior/Cognition: Alert;Doesn't follow directions Oral Cavity Assessment: Within Functional Limits Oral Care Completed by SLP: No Oral Cavity - Dentition: Dentures, top;Dentures, bottom Vision: Functional for self-feeding Self-Feeding Abilities: Able to feed self;Needs assist;Needs set up Patient Positioning: Upright in bed Baseline Vocal Quality: Normal Volitional Cough: Cognitively unable to elicit Volitional Swallow: Unable to elicit    Oral/Motor/Sensory Function Overall Oral Motor/Sensory Function: Mild impairment Facial Symmetry: Suspected CN VII (facial) dysfunction;Abnormal symmetry right   Ice Chips Ice chips: Within functional limits Presentation: Spoon   Thin Liquid Thin Liquid: Within functional limits Presentation: Cup    Nectar Thick Nectar Thick Liquid: Not tested   Honey Thick Honey Thick Liquid: Not tested   Puree Puree: Within functional limits Presentation: Spoon   Solid   GO   Solid: Within functional limits Presentation: Self Fed  [name], [place] CCC/SLP Pager [phone]  [name], [name] 01/08/2016,11:34 AM

## 2016-01-08 NOTE — Progress Notes (Signed)
STROKE TEAM PROGRESS NOTE   HISTORY OF PRESENT ILLNESS [name] is a 82 y.o. female with a history of dementia who was last seen well apparently 2 or 3 days ago, but I do not have family to confirm this and therefore history is taken from the medical record (LKW unknown). She was taken to [facility] 2 days ago and diagnosed with urinary tract infection. She was started on Cipro without improvement. Over the past few days she has been unable to complete her own ADLs and has had to be fed. She was therefore brought into the emergency room where UA was negative. Premorbid modified rankin scale: I suspect 1 or 2 as the nurse reports that she is able to accomplish all of her ADLs prior to admission. Patient was not administered IV t-PA secondary to unclear time of onset, but definitely out of the window. She was admitted for further evaluation and treatment.   SUBJECTIVE (INTERVAL HISTORY) No family is at the bedside.  Overall she feels her condition is stable. She has no new complaints, though she remains confused.   OBJECTIVE Temp:  [98.7 F (37.1 C)-99.4 F (37.4 C)] 98.7 F (37.1 C) (03/10 0548) Pulse Rate:  [73-98] 76 (03/10 0548) Cardiac Rhythm:  [-] Normal sinus rhythm (03/10 0011) Resp:  [15-30] 20 (03/10 0548) BP: (132-154)/(61-84) 137/63 mmHg (03/10 0548) SpO2:  [96 %-99 %] 99 % (03/10 0548)  CBC:  Recent Labs Lab 01/07/16 1824  WBC 10.3  NEUTROABS 6.3  HGB 11.0*  HCT 33.8*  MCV 92.1  PLT 208    Basic Metabolic Panel:  Recent Labs Lab 01/07/16 1824  NA 141  K 4.7  CL 109  CO2 19*  GLUCOSE 103*  BUN 12  CREATININE 1.08*  CALCIUM 8.6*    Lipid Panel:    Component Value Date/Time   CHOL 201* 01/08/2016 0614   TRIG 215* 01/08/2016 0614   HDL 37* 01/08/2016 0614   CHOLHDL 5.4 01/08/2016 0614   VLDL 43* 01/08/2016 0614   LDLCALC 121* 01/08/2016 0614   HgbA1c: No results found for: HGBA1C Urine Drug Screen: No results found for: LABOPIA,  COCAINSCRNUR, LABBENZ, AMPHETMU, THCU, LABBARB    IMAGING  Dg Elbow Complete Right 01/07/2016  Negative.   Dg Forearm Right 01/07/2016  Osteopenia.  No acute findings.   Ct Head Wo Contrast 01/07/2016  1. Atrophy and small vessel disease. 2. New low-attenuation in the posterior left frontal lobe consistent with subacute or chronic ischemic changes. No associated hemorrhage.   Dg Humerus Right 01/07/2016  Negative.    PHYSICAL EXAM Pleasant elderly Caucasian lady currently not in distress. . Afebrile. Head is nontraumatic. Neck is supple without bruit.    Cardiac exam no murmur or gallop. Lungs are clear to auscultation. Distal pulses are well felt. Neurological Exam :  Awake alert mostly nonverbal but does speak only occasional words. Will follow based simple midline commands only. Pupils are irregular equal but reactive. Blinks to threat on the left but not the right. Fundi were not visualized. The right lower facial weakness. Tongue midline. Motor system exam reveals purposeful antigravity strength on the left with normal tone and sensation and coordination. Right hemiplegia with 2/5 right upper extremity and 3/5 right lower extremity strength. Diminished tone and sensation on the right. Coordination cannot be reliably tested. Gait was not tested. Deep tendon reflexes are 2+ on the left and depressed on the right. Right plantar upgoing left downgoing.   ASSESSMENT/PLAN [name]  is a 82 y.o. female with history of ypertension, hyperlipidemia, GERD, hypothyroidism, dementia chronic kidney disease stage III, colitis, and esophageal stricture status post dilatation presenting with 2-3 days of right-sided weakness with fall at home. She did not receive IV t-PA due to unknown last known well.   Stroke:  subacute dominant left posterior frontal lobe infarct (vessel: distal ACA) embolic secondary to unknown source  Resultant  Increased confusion, R hemiparesis  MRI  pending   MRA  pending    Carotid Doppler  pending   2D Echo  pending   LDL 121  HgbA1c pending  Lovenox 40 mg sq daily for VTE prophylaxis  Diet NPO time specified  No antithrombotic prior to admission, now on aspirin 300 mg suppository daily  Given confusion and recent fall, patient not a good anticoagulation candidate. Therefore, will not pursue further embolic workup.  Need to speak with the family related to their goals of care for the patient - PEG? Living situation?.  Therapy recommendations:  pending   Disposition:  pending (independent with ADLs PTA per record)  Hypertension  Stable  Permissive hypertension (OK if < 220/120) but gradually normalize in 5-7 days  Hyperlipidemia  Home meds:  No statin  LDL 121, goal < 70  Add statin once patient able to swallow, depending on plan of care.  Other Stroke Risk Factors  Advanced age  Possible Obesity, There is no weight on file to calculate BMI.   Other Active Problems  Baseline dementia on Namenda and Exelon  Questionable UTI, urinalysis negative, antibiotics on hold  GERD  Hypothyroidism on Synthroid  Chronic kidney disease stage III, creatinine 1.08  Hospital day # 1  BIBY,SHARON  [facility] See Amion for Pager information 01/08/2016 10:30 AM  I have personally examined this patient, reviewed notes, independently viewed imaging studies, participated in medical decision making and plan of care. I have made any additions or clarifications directly to the above note. Agree with note above. She presented with altered mental status in the setting of feeling tract infection but clearly has right hemiplegia due to left brain frontal infarct etiology to be determined  but likely large vessel disease versus cardiogenic embolism. She remains at risk for neurological worsening, recurrent stroke, TIA. Patient may not be a long-term anticoagulation candidate given history of underlying dementia and will need to discuss  stroke workup with family when available.  Delia HeadyPramod Preslei Blakley, MD Medical Director [facility] Pager: [phone] 01/08/2016 11:15 AM    To contact Stroke Continuity provider, please refer to [email or web address]. After hours, contact General Neurology

## 2016-01-08 NOTE — Progress Notes (Signed)
Pt had incontinent episode. Bladder no longer palpable. RN will continue to monitor.

## 2016-01-08 NOTE — Evaluation (Signed)
Speech Language Pathology Evaluation Patient Details Name: Kayla SantiagoMary C Dumm MRN: [id number] DOB: 05/13/1931 Today's Date: 01/08/2016 Time: [id number] SLP Time Calculation (min) (ACUTE ONLY): 10 min  Problem List:  Patient Active Problem List   Diagnosis Date Noted  . Acute embolic stroke (HCC)   . UTI (lower urinary tract infection) 01/07/2016  . Stroke (cerebrum) (HCC) 01/07/2016  . Alzheimer's dementia 03/26/2015  . GERD (gastroesophageal reflux disease) 03/26/2015  . HTN (hypertension) 03/26/2015  . CKD (chronic kidney disease), stage III 03/26/2015  . S/P cholecystectomy 03/26/2015   Past Medical History:  Past Medical History  Diagnosis Date  . Hypertension   . Senile-onset Alzheimer's dementia with behavioral disturbance   . Verrucous keratosis   . Complete tear of right rotator cuff   . Hyperlipidemia   . Hypothyroidism   . Osteoporosis   . Chronic kidney disease, stage 3   . Keratoconjunctivitis due to Adenovirus   . GERD without esophagitis   . Noninfective gastroenteritis and colitis   . Arthritis   . Infectious colitis   . Diverticulosis   . Status post dilation of esophageal narrowing    Past Surgical History:  Past Surgical History  Procedure Laterality Date  . Cholecystectomy    . Abdominal hysterectomy    . Breast surgery Bilateral    HPI:  82 y.o. female with history of hypertension, hyperlipidemia, GERD, hypothyroidism, dementia chronic kidney disease stage III, colitis, and esophageal stricture status post dilatation presenting with 2-3 days of right-sided weakness with fall at home.  CT subacute dominant left posterior frontal lobe infarct.  04/02/15 esophagram revealed severe esophageal motility disorder, with extensive tertiary contractions.    Assessment / Plan / Recommendation Clinical Impression  Pt with baseline Alzheimer's dementia now presents with aphasia - she is nonverbal, does not follow commands; smiles/laughs but no purposeful communicative  effort.  Daughter wants to continue to care for her mother at home, but isn't sure she can handle new level of care required.  Explained that PT/OT will help determine disposition planning.  Pt may benefit from trial SLP intervention at next level of care - no further acute f/u needed.        SLP Assessment  All further Speech Lanaguage Pathology  needs can be addressed in the next venue of care    Follow Up Recommendations  None    Frequency and Duration           SLP Evaluation Prior Functioning  Cognitive/Linguistic Baseline: Baseline deficits Baseline deficit details: memory Type of Home: House  Lives With: Alone (friend staying with her and daughter provides near 24/7 now) Available Help at Discharge: Family;Friend(s);Available PRN/intermittently   Cognition  Overall Cognitive Status: Impaired/Different from baseline Arousal/Alertness: Awake/alert Orientation Level: Disoriented X4 Attention: Sustained Sustained Attention: Impaired Sustained Attention Impairment: Verbal basic;Functional basic Memory: Impaired Awareness: Impaired    Comprehension  Auditory Comprehension Overall Auditory Comprehension: Impaired Yes/No Questions: Impaired Basic Biographical Questions: 0-25% accurate Commands: Impaired One Step Basic Commands: 0-24% accurate Conversation: Simple Visual Recognition/Discrimination Discrimination: Exceptions to [facility] L/R Discrimination: Unable to indentify Reading Comprehension Reading Status: Not tested    Expression Expression Primary Mode of Expression: Other (comment) Verbal Expression Overall Verbal Expression: Impaired Initiation: Impaired Level of Generative/Spontaneous Verbalization:  (no attempts to verbalize) Repetition: Impaired Level of Impairment: Word level Naming: Impairment Responsive: 0-25% accurate Non-Verbal Means of Communication: Not applicable Written Expression Dominant Hand: Right   Oral / Motor  Oral Motor/Sensory  Function Overall Oral Motor/Sensory Function: Mild impairment Facial  Symmetry: Suspected CN VII (facial) dysfunction;Abnormal symmetry right Motor Speech Overall Motor Speech:  (nonverbal)   GO                    Blenda Mounts Laurice 01/08/2016, 1:52 PM

## 2016-01-08 NOTE — Progress Notes (Signed)
Rehab Admissions Coordinator Note:  Patient was screened by Clois DupesBoyette, Peony Barner Godwin for appropriateness for an Inpatient Acute Rehab Consult per PT recommendation. At this time, we are recommending OT eval and to see if pt able to carryover with therapy sessions before determining rehab venue. Also need clarification if family able to continue care at home longt erm given new dx. I will follow.  Clois DupesBoyette, Skylinn Vialpando Godwin 01/08/2016, 3:58 PM  I can be reached at [phone].

## 2016-01-09 ENCOUNTER — Inpatient Hospital Stay (HOSPITAL_COMMUNITY): Payer: Medicare Other

## 2016-01-09 ENCOUNTER — Encounter (HOSPITAL_COMMUNITY): Payer: Self-pay | Admitting: Radiology

## 2016-01-09 DIAGNOSIS — I679 Cerebrovascular disease, unspecified: Secondary | ICD-10-CM | POA: Diagnosis present

## 2016-01-09 DIAGNOSIS — I63322 Cerebral infarction due to thrombosis of left anterior cerebral artery: Secondary | ICD-10-CM

## 2016-01-09 LAB — URINE CULTURE: CULTURE: NO GROWTH

## 2016-01-09 LAB — HEMOGLOBIN A1C
Hgb A1c MFr Bld: 5.4 % (ref 4.8–5.6)
MEAN PLASMA GLUCOSE: 108 mg/dL

## 2016-01-09 MED ORDER — IOHEXOL 350 MG/ML SOLN
80.0000 mL | Freq: Once | INTRAVENOUS | Status: AC | PRN
  Administered 2016-01-09: 100 mL via INTRAVENOUS

## 2016-01-09 NOTE — Progress Notes (Signed)
Nutrition Brief Note  Patient identified on the Malnutrition Screening Tool (MST) Report  Wt Readings from Last 15 Encounters:  03/26/15 155 lb 4 oz (70.421 kg)   Bed weight today was 153.3 lbs. Unsure if bed was zeroed and roughly 1-2 lbs of extra objects estimated on bed.   Body mass index using ht from only encounter is 27.6 kg/(m^2).Patient meets criteria for Overweight based on current BMI.   Family members "do not know how this happened to her" citing that she "has always been healthy". They report patient eat 2-3 meals a day, does not use salt and had been active up until she developed dementia. Pt did not take any vit/mins at home. Daughter who lives next door says she has noticed no changes in pt's eating habits and pt looks the same as usual. She says pt UBW is ~157 lbs.   Family reports pt does have some trouble chewing the meats due to dentures. Thought she would benefit from mech soft meat. Will order.   Current diet order is [employer], regular consistency and patient is consuming approximately 75% of meals at this time. Labs and medications reviewed.   No nutrition further interventions warranted at this time. If nutrition issues arise, please consult RD.   Christophe LouisNathan Franks RD, LDN Nutrition Pager: [phone] 01/09/2016 11:20 AM

## 2016-01-09 NOTE — Progress Notes (Signed)
STROKE TEAM PROGRESS NOTE   HISTORY OF PRESENT ILLNESS [name] is a 82 y.o. female with a history of dementia who was last seen well apparently 2 or 3 days ago, but I do not have family to confirm this and therefore history is taken from the medical record (LKW unknown). She was taken to [facility] 2 days ago and diagnosed with urinary tract infection. She was started on Cipro without improvement. Over the past few days she has been unable to complete her own ADLs and has had to be fed. She was therefore brought into the emergency room where UA was negative. Premorbid modified rankin scale: I suspect 1 or 2 as the nurse reports that she is able to accomplish all of her ADLs prior to admission. Patient was not administered IV t-PA secondary to unclear time of onset, but definitely out of the window. She was admitted for further evaluation and treatment.   SUBJECTIVE (INTERVAL HISTORY) Daughter is at the bedside. The patient is still lethargic today and slow to respond. Has mitten in both hands and likely to have confusion agitation at night.   OBJECTIVE Temp:  [97.9 F (36.6 C)-98.8 F (37.1 C)] 97.9 F (36.6 C) (03/11 1427) Pulse Rate:  [84-94] 89 (03/11 1427) Cardiac Rhythm:  [-] Normal sinus rhythm (03/11 0700) Resp:  [16-20] 16 (03/11 1427) BP: (112-144)/(58-73) 134/69 mmHg (03/11 1427) SpO2:  [95 %-99 %] 97 % (03/11 1427)  CBC:   Recent Labs Lab 01/07/16 1824  WBC 10.3  NEUTROABS 6.3  HGB 11.0*  HCT 33.8*  MCV 92.1  PLT 208    Basic Metabolic Panel:   Recent Labs Lab 01/07/16 1824  NA 141  K 4.7  CL 109  CO2 19*  GLUCOSE 103*  BUN 12  CREATININE 1.08*  CALCIUM 8.6*    Lipid Panel:     Component Value Date/Time   CHOL 201* 01/08/2016 0614   TRIG 215* 01/08/2016 0614   HDL 37* 01/08/2016 0614   CHOLHDL 5.4 01/08/2016 0614   VLDL 43* 01/08/2016 0614   LDLCALC 121* 01/08/2016 0614   HgbA1c:  Lab Results  Component Value Date   HGBA1C 5.4  01/08/2016   Urine Drug Screen: No results found for: LABOPIA, COCAINSCRNUR, LABBENZ, AMPHETMU, THCU, LABBARB    IMAGING I have personally reviewed the radiological images below and agree with the radiology interpretations.  Ct Head Wo Contrast 01/07/2016  1. Atrophy and small vessel disease. 2. New low-attenuation in the posterior left frontal lobe consistent with subacute or chronic ischemic changes. No associated hemorrhage.   MRI / MRA HEAD 01/08/2016 1. Small to moderate volume of acute infarction in the high left frontal lobe predominantly involving the ACA territory. 2. Moderate to severe proximal left A2 ACA stenosis. Severe tandem stenoses with decreased flow more distally in the left ACA. 3. Right ACA proximal A2 occlusion. 4. Severe bilateral proximal PCA stenoses and severe left MCA branch vessel stenoses. 5. Moderate chronic small vessel ischemic disease. 6. Chronic pontine infarct.   CTA of the neck 01/09/2016 1. Mild cervical carotid artery atherosclerosis without stenosis. 2. Widely patent vertebral arteries.  2D echo  - Left ventricle: The cavity size was normal. There was mild focal  basal hypertrophy of the septum. Systolic function was vigorous.  The estimated ejection fraction was in the range of 65% to 70%.  Wall motion was normal; there were no regional wall motion  abnormalities. Doppler parameters are consistent with abnormal  left ventricular relaxation (  grade 1 diastolic dysfunction).  There was no evidence of elevated ventricular filling pressure by  Doppler parameters. - Aortic valve: Trileaflet; normal thickness leaflets. There was  mild regurgitation. - Aortic root: The aortic root was normal in size. - Mitral valve: Structurally normal valve. There was trivial  regurgitation. - Right ventricle: The cavity size was normal. Wall thickness was  normal. Systolic function was normal. - Right atrium: The atrium was normal in size. -  Tricuspid valve: There was mild regurgitation. - Pulmonary arteries: Systolic pressure was within the normal  range. PA peak pressure: 35 mm Hg (S). - Inferior vena cava: The vessel was normal in size. - Pericardium, extracardiac: There was no pericardial effusion.  PHYSICAL EXAM  Temp:  [97.9 F (36.6 C)-98.8 F (37.1 C)] 97.9 F (36.6 C) (03/11 1427) Pulse Rate:  [84-94] 89 (03/11 1427) Resp:  [16-20] 16 (03/11 1427) BP: (112-144)/(58-73) 134/69 mmHg (03/11 1427) SpO2:  [95 %-99 %] 97 % (03/11 1427)  General - Well nourished, well developed, in no apparent distress, mild lethargy, abulia, psychomotor slowing, apathic.  Ophthalmologic - Fundi not visualized due to noncooperation.  Cardiovascular - Regular rate and rhythm.  Neuro - awake, alert, mild lethargy, abulia, psychomotor slowing, apathic. Lack of language outpt, able to make sound. Able to tracking objects with eyes. Blinking to threat bilaterally, PERRL, EOMI, right facial droop, tongue in midline, bilateral UE spontaneous movement, against gravity equally. Right LE 3/5, RLE 4+/5. DTR 1+, no Babinski bilaterally. Sensation, coordination and gait not tested due to noncooperation.   ASSESSMENT/PLAN Ms. Lawrence SantiagoMary C Schroeder is a 82 y.o. female with history of hypertension, hyperlipidemia, GERD, hypothyroidism, dementia chronic kidney disease stage III, colitis, and esophageal stricture status post dilatation presenting with 2-3 days of right-sided weakness with fall at home. She did not receive IV t-PA due to unknown last known well.   Stroke:  subacute dominant left ACA infarct likely secondary to diffuse intracranial atherosclerosis  Resultant abulia, right facial droop, RLE weakness  MRI  left ACA infarct. Chronic pontine infarct.  MRA  diffuse intracranial atherosclerosis, including bilateral ACA, PCA, and left M1  CTA of neck - no significant stenosis  2D Echo -- EF 65-70%  LDL 121  HgbA1c 5.4  Lovenox 40 mg sq daily  for VTE prophylaxis Diet Heart Room service appropriate?: Yes; Fluid consistency:: Thin  No antithrombotic prior to admission, now on aspirin 300 mg suppository daily.  Therapy recommendations:  pending  Disposition:  pending   Dementia with behavior disturbance  Need assistance for ADLs  Has nursing aide 3/week  On exelon patch and namenda for long time at home  sundowing and hospital delirium  Correct sleep wake cycle  Supportive care  Hypertension  Stable  Permissive hypertension (OK if < 220/120) but gradually normalize in 5-7 days  Hyperlipidemia  Home meds:  No statin  LDL 121, goal < 70  Now on Lipitor 40 mg daily  Continue statin at time of discharge  Other Stroke Risk Factors  Advanced age  Other Active Problems  Questionable UTI, urinalysis negative, antibiotics on hold  GERD  Hypothyroidism on Synthroid  Chronic kidney disease stage III, creatinine 1.08  Hospital day # 2  Neurology will sign off. Please call with questions. [facility]. Thanks for the consult.  Marvel PlanJindong Libni Fusaro, MD PhD Stroke Neurology 01/09/2016 5:11 PM   To contact Stroke Continuity provider, please refer to [email or web address]. After hours, contact  General Neurology

## 2016-01-09 NOTE — Progress Notes (Signed)
Mittens have been removed while family is at bedside, pt just reapplied due to family leaving for the night. rn heard pt say 2 words for the first time today, "I know".
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
## 2016-01-09 NOTE — Progress Notes (Signed)
[facility] PROGRESS NOTE  [name] ZDG:[id number] DOB: 14-Jan-1931 DOA: 01/07/2016 PCP: Abigail Miyamoto, MD  Brief narrative 82 year old female with history of hypertension, TIA, Alzheimer's dementia, she daily stage III, GERD, hypothyroidism . Patient had a fall at home 3 days prior to admission and was taken to [facility] ED where she was diagnosed with UTI and given IV antibiotics and then discharged on oral ciprofloxacin. However symptoms did not improve. (At baseline patient communicates and is able to ambulate in and around the house and feed herself). Patient appeared to be more confused than usual and unable to get out of bed or feet herself. Family called her PCP and then brought her to [facility] ED. In the ED her vitals were stable. Blood work showed WBC of 10.3, lactate of 1.73, UA was normal, creatinine of 1.08. X-ray of the right elbow, right forearm and right humerus were negative for any fractures. Head CT showed atrophy and a new low-attenuation in the posterior left frontal lobe consistent with subacute stroke. Admitted to hospital service for stroke workup and neurology consulted. Since symptoms had occurred at least 72 hours prior to presentation patient was out of therapeutic window for TPA.   Assessment/Plan: Subacute left posterior frontal lobe infarct Has residual right hemiparesis and worsening confusion. Suspect embolic stroke. MRI brain shows small-to-moderate volume of acute infarction in the high left frontal lobe predominantly involving left ACA territory. Also has moderate to severe proximal left A2 ACA stenosis, bilateral proximal PCA and severe left MCA branch vessel stenosis. Also shows a chronic pontine infarct. -Sided weakness slowly improving but has aphasia and worsening confusion due to frontal lobe involvement. 2-D echo with  no source of emboli.. CT angiogram of the neck shows mild carotid artery atherosclerosis without stenosis. LDL of 121. -A1c  5.4. -Seen by speech and swallow and recommend regular diet with thin liquids. Administer whole medications with puree.  Continue neuro checks. Stroke team following. Given her dementia and fall risk she is not a candidate for anticoagulation. Started on full dose aspirin and Lipitor. Allow permissive blood pressure. -CIR Consulted.  GERD Continue Protonix    hypothyroidism  continue Synthroid.  TSH normal.  CK D stage III Renal function stable.  Alzheimer's dementia  DVT prophylaxis: Subcutaneous Lovenox Diet: Heart healthy  Code Status: Full code Family Communication: Order at bedside Disposition Plan: Pending further workup.   Consultants:  Neurology  Procedures:  CT head  MRI brain/MRA head  2-D echo  Carotid Doppler  Antibiotics:  None  HPI/Subjective: Seen and examined. Aphasic with significant confusion.  Objective: Filed Vitals:   01/09/16 0518 01/09/16 1035  BP: 134/70 119/73  Pulse:  84  Temp: 98.8 F (37.1 C) 98.1 F (36.7 C)  Resp: 16 16    Intake/Output Summary (Last 24 hours) at 01/09/16 1254 Last data filed at 01/08/16 1800  Gross per 24 hour  Intake    360 ml  Output      0 ml  Net    360 ml   There were no vitals filed for this visit.  Exam:   General:   nonverbal, Flat aphasic  HEENT:left facial droop  Chest: Clear bilaterally  Cardiovascular: Normal S1 and S2, no murmurs rub or gallop  Abdomen: , Nondistended, nontender, bowel sounds present  Musculoskeletal: Warm, no edema   CNS: Nonverbal, flat affect,left  facial droop , right hemiparesis(Improved), aphasic  Data Reviewed: Basic Metabolic Panel:  Recent Labs Lab 01/07/16 1824  NA 141  K  4.7  CL 109  CO2 19*  GLUCOSE 103*  BUN 12  CREATININE 1.08*  CALCIUM 8.6*   Liver Function Tests:  Recent Labs Lab 01/07/16 1824  AST 44*  ALT 24  ALKPHOS 82  BILITOT 1.3*  PROT 6.1*  ALBUMIN 3.2*   No results for input(s): LIPASE, AMYLASE in the last  168 hours. No results for input(s): AMMONIA in the last 168 hours. CBC:  Recent Labs Lab 01/07/16 1824  WBC 10.3  NEUTROABS 6.3  HGB 11.0*  HCT 33.8*  MCV 92.1  PLT 208   Cardiac Enzymes: No results for input(s): CKTOTAL, CKMB, CKMBINDEX, TROPONINI in the last 168 hours. BNP (last 3 results) No results for input(s): BNP in the last 8760 hours.  ProBNP (last 3 results) No results for input(s): PROBNP in the last 8760 hours.  CBG:  Recent Labs Lab 01/08/16 2211  GLUCAP 119*    Recent Results (from the past 240 hour(s))  Urine culture     Status: None   Collection Time: 01/07/16  8:06 PM  Result Value Ref Range Status   Specimen Description URINE, CATHETERIZED  Final   Special Requests NONE  Final   Culture NO GROWTH 2 DAYS  Final   Report Status 01/09/2016 FINAL  Final     Studies: Dg Elbow Complete Right  01/07/2016  CLINICAL DATA:  Pain when palpated by MD on right shoulder, elbow and forearm. Pt. Could not localize any pain. However expressed pain upon movement of this right arm. Weakness noted on entire right side of body with facial droop x 2 days. EXAM: RIGHT ELBOW - COMPLETE 3+ VIEW COMPARISON:  Humerus same day FINDINGS: There is no evidence of fracture, dislocation, or joint effusion. There is no evidence of arthropathy or other focal bone abnormality. Soft tissues are unremarkable. IMPRESSION: Negative. Electronically Signed   By: Norva Pavlov M.D.   On: 01/07/2016 19:14   Dg Forearm Right  01/07/2016  CLINICAL DATA:  Pain EXAM: RIGHT FOREARM - 2 VIEW COMPARISON:  None. FINDINGS: There is diffuse osteopenia which limits characterization of osseous detail. No acute or suspicious osseous lesions are identified. No fracture line or displaced fracture fragment seen. Soft tissues about the right forearm are unremarkable. No significant degenerative change appreciated. IMPRESSION: Osteopenia.  No acute findings. Electronically Signed   By: Bary Richard M.D.   On:  01/07/2016 19:10   Ct Head Wo Contrast  01/07/2016  CLINICAL DATA:  Pt with onset of AMS.Pts daughter reports right side of mouth drawn up and extremities weak. EXAM: CT HEAD WITHOUT CONTRAST TECHNIQUE: Contiguous axial images were obtained from the base of the skull through the vertex without intravenous contrast. COMPARISON:  11/22/2013 FINDINGS: There is significant central cortical atrophy. Periventricular white matter changes are consistent with small vessel disease. New low-attenuation identified within the periventricular posterior left frontal region to represent a subacute infarct. There is no associated hemorrhage or significant mass effect. IMPRESSION: 1. Atrophy and small vessel disease. 2. New low-attenuation in the posterior left frontal lobe consistent with subacute or chronic ischemic changes. No associated hemorrhage. The salient findings were discussed with SCOTT GOLDSTON on 01/07/2016 at 7:26 pm. Electronically Signed   By: Norva Pavlov M.D.   On: 01/07/2016 19:28   Ct Angio Neck W/cm &/or Wo/cm  01/09/2016  CLINICAL DATA:  Left cerebral hemispheric stroke. Right-sided hemi paresis and aphasia. EXAM: CT ANGIOGRAPHY NECK TECHNIQUE: Multidetector CT imaging of the neck was performed using the standard protocol during bolus  administration of intravenous contrast. Multiplanar CT image reconstructions and MIPs were obtained to evaluate the vascular anatomy. Carotid stenosis measurements (when applicable) are obtained utilizing NASCET criteria, using the distal internal carotid diameter as the denominator. CONTRAST:  OMNIPAQUE IOHEXOL 350 MG/ML SOLN COMPARISON:  None. FINDINGS: Aortic arch: 3 vessel aortic arch with mild-to-moderate aortic plaque. Mild atheromatous plaque is present in the brachiocephalic artery without significant stenosis. There is mild narrowing of the proximal left subclavian artery due to calcified plaque. Right carotid system: Patent with mild non stenotic plaque in  the proximal ICA. Left carotid system: Patent with mild non stenotic plaque in the mid and distal common and proximal internal carotid arteries. Vertebral arteries:Vertebral arteries are patent with the left being dominant. No vertebral artery stenosis is seen. Skeleton: Mild cervical spondylosis. Other neck: Mild pleural parenchymal scarring in the lung apices. 1.1 cm short axis lymph node anterior to the proximal right mainstem bronchus is unchanged from a 02/27/2015 chest CT. IMPRESSION: 1. Mild cervical carotid artery atherosclerosis without stenosis. 2. Widely patent vertebral arteries. Electronically Signed   By: Sebastian Ache M.D.   On: 01/09/2016 08:18   Mr Brain Wo Contrast  01/08/2016  CLINICAL DATA:  Stroke.  Right hemiparesis and aphasia. EXAM: MRI HEAD WITHOUT CONTRAST MRA HEAD WITHOUT CONTRAST TECHNIQUE: Multiplanar, multiecho pulse sequences of the brain and surrounding structures were obtained without intravenous contrast. Angiographic images of the head were obtained using MRA technique without contrast. COMPARISON:  Head CT 01/07/2016 FINDINGS: MRI HEAD FINDINGS Multiple areas of restricted diffusion are seen in the left frontal lobe consistent with acute infarcts. The most confluent area is small to moderate in size and involves the posteromedial left frontal lobe, cingulate gyrus, and body of the corpus callosum in the distal ACA distribution. Additional smaller foci of acute cortical and subcortical infarction are noted more anteriorly and laterally in the left frontal lobe, possibly ACA/ MCA watershed. There is associated cytotoxic edema without evidence of hemorrhage or mass effect. There is moderate cerebral atrophy. A small, chronic infarct is noted in the left paracentral pons. Patchy to confluent T2 hyperintensities in the subcortical and deep cerebral white matter and brainstem are compatible with moderate chronic small vessel ischemic disease. Chronic small vessel ischemic changes are  also noted in the basal ganglia bilaterally. Prior bilateral cataract extraction is noted. There are small right and trace left mastoid effusions. No significant paranasal sinus disease. Major intracranial vascular flow voids are preserved. MRA HEAD FINDINGS The visualized distal vertebral arteries are patent without stenosis. Left vertebral artery is mildly dominant. PICA, AICA, and SCA origins are patent. Basilar artery is patent without stenosis. There is a fetal type origin of the left PCA. A right posterior communicating artery is not identified. There is a severe right PCA stenosis near the P1 - P2 junction. There is also moderate to severe narrowing of the proximal left P2 segment. Moderate PCA branch vessel irregularity and narrowing are present bilaterally. Internal carotid arteries are patent from skullbase to carotid termini without stenosis. Slightly bulbous appearance of the left posterior communicating artery origin likely represents an infundibulum. MCAs are patent without evidence of significant proximal stenosis. There is asymmetric irregular left MCA branch vessel narrowing including severe focal stenoses involving distal M2 and M3 inferior division branch vessels. The A1 segments are patent without stenosis. The right A2 segment is occluded proximally. There is a moderate to severe stenosis of the proximal left A2 segment. More distal left A2 and proximal A3 segments  appear heavily diseased with tandem severe stenoses and diminished flow related enhancement. The MRA does not extend superiorly to include most of the area of acute infarction. No intracranial aneurysm is identified. IMPRESSION: 1. Small to moderate volume of acute infarction in the high left frontal lobe predominantly involving the ACA territory. 2. Moderate to severe proximal left A2 ACA stenosis. Severe tandem stenoses with decreased flow more distally in the left ACA. 3. Right ACA proximal A2 occlusion. 4. Severe bilateral  proximal PCA stenoses and severe left MCA branch vessel stenoses. 5. Moderate chronic small vessel ischemic disease. 6. Chronic pontine infarct. Electronically Signed   By: Sebastian AcheAllen  Grady M.D.   On: 01/08/2016 13:05   Dg Humerus Right  01/07/2016  CLINICAL DATA:  Pain when palpated by MD on right shoulder, elbow and forearm. Pt. Could not localize any pain. However expressed pain upon movement of this right arm. Weakness noted on entire right side of body with facial droop x 2 days. EXAM: RIGHT HUMERUS - 2+ VIEW COMPARISON:  None. FINDINGS: There is no evidence of fracture or other focal bone lesions. Soft tissues are unremarkable. IMPRESSION: Negative. Electronically Signed   By: Norva PavlovElizabeth  Brown M.D.   On: 01/07/2016 19:09   Mr Maxine GlennMra Head/brain Wo Cm  01/08/2016  CLINICAL DATA:  Stroke.  Right hemiparesis and aphasia. EXAM: MRI HEAD WITHOUT CONTRAST MRA HEAD WITHOUT CONTRAST TECHNIQUE: Multiplanar, multiecho pulse sequences of the brain and surrounding structures were obtained without intravenous contrast. Angiographic images of the head were obtained using MRA technique without contrast. COMPARISON:  Head CT 01/07/2016 FINDINGS: MRI HEAD FINDINGS Multiple areas of restricted diffusion are seen in the left frontal lobe consistent with acute infarcts. The most confluent area is small to moderate in size and involves the posteromedial left frontal lobe, cingulate gyrus, and body of the corpus callosum in the distal ACA distribution. Additional smaller foci of acute cortical and subcortical infarction are noted more anteriorly and laterally in the left frontal lobe, possibly ACA/ MCA watershed. There is associated cytotoxic edema without evidence of hemorrhage or mass effect. There is moderate cerebral atrophy. A small, chronic infarct is noted in the left paracentral pons. Patchy to confluent T2 hyperintensities in the subcortical and deep cerebral white matter and brainstem are compatible with moderate chronic  small vessel ischemic disease. Chronic small vessel ischemic changes are also noted in the basal ganglia bilaterally. Prior bilateral cataract extraction is noted. There are small right and trace left mastoid effusions. No significant paranasal sinus disease. Major intracranial vascular flow voids are preserved. MRA HEAD FINDINGS The visualized distal vertebral arteries are patent without stenosis. Left vertebral artery is mildly dominant. PICA, AICA, and SCA origins are patent. Basilar artery is patent without stenosis. There is a fetal type origin of the left PCA. A right posterior communicating artery is not identified. There is a severe right PCA stenosis near the P1 - P2 junction. There is also moderate to severe narrowing of the proximal left P2 segment. Moderate PCA branch vessel irregularity and narrowing are present bilaterally. Internal carotid arteries are patent from skullbase to carotid termini without stenosis. Slightly bulbous appearance of the left posterior communicating artery origin likely represents an infundibulum. MCAs are patent without evidence of significant proximal stenosis. There is asymmetric irregular left MCA branch vessel narrowing including severe focal stenoses involving distal M2 and M3 inferior division branch vessels. The A1 segments are patent without stenosis. The right A2 segment is occluded proximally. There is a moderate to severe  stenosis of the proximal left A2 segment. More distal left A2 and proximal A3 segments appear heavily diseased with tandem severe stenoses and diminished flow related enhancement. The MRA does not extend superiorly to include most of the area of acute infarction. No intracranial aneurysm is identified. IMPRESSION: 1. Small to moderate volume of acute infarction in the high left frontal lobe predominantly involving the ACA territory. 2. Moderate to severe proximal left A2 ACA stenosis. Severe tandem stenoses with decreased flow more distally in the  left ACA. 3. Right ACA proximal A2 occlusion. 4. Severe bilateral proximal PCA stenoses and severe left MCA branch vessel stenoses. 5. Moderate chronic small vessel ischemic disease. 6. Chronic pontine infarct. Electronically Signed   By: Sebastian Ache M.D.   On: 01/08/2016 13:05    Scheduled Meds: .  stroke: mapping our early stages of recovery book   Does not apply Once  . aspirin  300 mg Rectal Daily   Or  . aspirin  325 mg Oral Daily  . atorvastatin  40 mg Oral q1800  . enoxaparin (LOVENOX) injection  40 mg Subcutaneous Q24H  . fluticasone  1 spray Each Nare Daily  . levothyroxine  25 mcg Oral QAC breakfast  . memantine  28 mg Oral Daily  . pantoprazole  40 mg Oral Daily   Continuous Infusions: . sodium chloride 75 mL/hr at 01/08/16 0539      Time spent: 25 minutes    Eddie North  [facility] Pager [phone] If 7PM-7AM, please contact night-coverage at [email or web address], password [facility] 01/09/2016, 12:54 PM  LOS: 2 days

## 2016-01-10 ENCOUNTER — Inpatient Hospital Stay (HOSPITAL_COMMUNITY): Payer: Medicare Other

## 2016-01-10 DIAGNOSIS — F0390 Unspecified dementia without behavioral disturbance: Secondary | ICD-10-CM

## 2016-01-10 DIAGNOSIS — R509 Fever, unspecified: Secondary | ICD-10-CM

## 2016-01-10 LAB — CBC
HEMATOCRIT: 38.4 % (ref 36.0–46.0)
HEMOGLOBIN: 12.7 g/dL (ref 12.0–15.0)
MCH: 30.5 pg (ref 26.0–34.0)
MCHC: 33.1 g/dL (ref 30.0–36.0)
MCV: 92.1 fL (ref 78.0–100.0)
Platelets: 269 10*3/uL (ref 150–400)
RBC: 4.17 MIL/uL (ref 3.87–5.11)
RDW: 13.4 % (ref 11.5–15.5)
WBC: 13.2 10*3/uL — AB (ref 4.0–10.5)

## 2016-01-10 LAB — URINE MICROSCOPIC-ADD ON
Bacteria, UA: NONE SEEN
WBC UA: NONE SEEN WBC/hpf (ref 0–5)

## 2016-01-10 LAB — BASIC METABOLIC PANEL
ANION GAP: 13 (ref 5–15)
BUN: 10 mg/dL (ref 6–20)
CHLORIDE: 107 mmol/L (ref 101–111)
CO2: 20 mmol/L — AB (ref 22–32)
Calcium: 8.3 mg/dL — ABNORMAL LOW (ref 8.9–10.3)
Creatinine, Ser: 0.9 mg/dL (ref 0.44–1.00)
GFR calc non Af Amer: 58 mL/min — ABNORMAL LOW (ref >60)
Glucose, Bld: 121 mg/dL — ABNORMAL HIGH (ref 65–99)
POTASSIUM: 3.6 mmol/L (ref 3.5–5.1)
SODIUM: 140 mmol/L (ref 135–145)

## 2016-01-10 LAB — GLUCOSE, CAPILLARY
GLUCOSE-CAPILLARY: 102 mg/dL — AB (ref 65–99)
Glucose-Capillary: 112 mg/dL — ABNORMAL HIGH (ref 65–99)

## 2016-01-10 LAB — URINALYSIS, ROUTINE W REFLEX MICROSCOPIC
Bilirubin Urine: NEGATIVE
GLUCOSE, UA: NEGATIVE mg/dL
KETONES UR: NEGATIVE mg/dL
LEUKOCYTES UA: NEGATIVE
NITRITE: NEGATIVE
PROTEIN: NEGATIVE mg/dL
Specific Gravity, Urine: 1.016 (ref 1.005–1.030)
pH: 6 (ref 5.0–8.0)

## 2016-01-10 MED ORDER — PIPERACILLIN-TAZOBACTAM 3.375 G IVPB
3.3750 g | Freq: Three times a day (TID) | INTRAVENOUS | Status: DC
  Administered 2016-01-10 – 2016-01-11 (×2): 3.375 g via INTRAVENOUS
  Filled 2016-01-10 (×4): qty 50

## 2016-01-10 MED ORDER — RIVASTIGMINE 9.5 MG/24HR TD PT24
9.5000 mg | MEDICATED_PATCH | TRANSDERMAL | Status: DC
  Administered 2016-01-10 – 2016-01-11 (×2): 9.5 mg via TRANSDERMAL
  Filled 2016-01-10 (×3): qty 1

## 2016-01-10 MED ORDER — PIPERACILLIN-TAZOBACTAM 3.375 G IVPB 30 MIN
3.3750 g | Freq: Once | INTRAVENOUS | Status: AC
  Administered 2016-01-10: 3.375 g via INTRAVENOUS
  Filled 2016-01-10: qty 50

## 2016-01-10 NOTE — Progress Notes (Signed)
Pharmacy Antibiotic Note Lawrence SantiagoMary C Schroeder is a 82 y.o. female admitted on 01/07/2016 with concern for aspiration pneumonia in setting of recent stroke. Pharmacy has been consulted for Zosyn dosing.  Plan: 1. Zosyn 3.375g IV q8h (4 hour infusion).  2. Narrow or scale back abx as feasible; unasyn is a reasonable option if pseudomonas coverage not felt to be warranted.     Temp (24hrs), Avg:99.6 F (37.6 C), Min:97.5 F (36.4 C), Max:101.1 F (38.4 C)   Recent Labs Lab 01/07/16 1824 01/07/16 1842 01/10/16 1141  WBC 10.3  --  13.2*  CREATININE 1.08*  --  0.90  LATICACIDVEN  --  1.73  --      No Known Allergies  Antimicrobials this admission: 3/12 Zosyn >>    Dose adjustments this admission: n/a  Microbiology results: 3/12 UCx: ngF    Thank you for allowing pharmacy to be a part of this patient's care.  Pollyann SamplesAndy Yittel Emrich, PharmD, BCPS 01/10/2016, 3:37 PM Pager: [phone]  '

## 2016-01-10 NOTE — Evaluation (Signed)
Clinical/Bedside Swallow Evaluation Patient Details  Name: [name] MRN: [id number] Date of Birth: 1931-10-25  Today's Date: 01/10/2016 Time: SLP Start Time (ACUTE ONLY): 1140 SLP Stop Time (ACUTE ONLY): 1200 SLP Time Calculation (min) (ACUTE ONLY): 20 min  Past Medical History:  Past Medical History  Diagnosis Date  . Hypertension   . Senile-onset Alzheimer's dementia with behavioral disturbance   . Verrucous keratosis   . Complete tear of right rotator cuff   . Hyperlipidemia   . Hypothyroidism   . Osteoporosis   . Chronic kidney disease, stage 3   . Keratoconjunctivitis due to Adenovirus   . GERD without esophagitis   . Noninfective gastroenteritis and colitis   . Arthritis   . Infectious colitis   . Diverticulosis   . Status post dilation of esophageal narrowing    Past Surgical History:  Past Surgical History  Procedure Laterality Date  . Cholecystectomy    . Abdominal hysterectomy    . Breast surgery Bilateral    HPI:  82 y.o. female with history of hypertension, hyperlipidemia, GERD, hypothyroidism, dementia chronic kidney disease stage III, colitis, and esophageal stricture status post dilatation presenting with 2-3 days of right-sided weakness with fall at home.  CT subacute dominant left posterior frontal lobe infarct.  04/02/15 esophagram revealed severe esophageal motility disorder, with extensive tertiary   Assessment / Plan / Recommendation Clinical Impression  Pt is lethargic and will easily fall to sleep, clearly does not feel well. With cues to awaken pt and sustain arousal, pt is able to initiate labial seal and sip from cup and straw with timely swallow. Pt also accepts bites and masticates and transits bolus as observed yesterday. Given onset of fever, she is simply not as alert, though swallow is not newly impaired. Her swallow function will depend on her arousal. Would not recommend keeping pt NPO, as she is capable of swallowing if alert and may desire  PO later today or this evening. Suggest allowing diet to continue and allowing staff to offer PO when pt is alert and able to sustain attention. If she is drowsy, do not attempt to feed, and hold tray. SLP will f/u x1 tomorrow to check for tolerance.     Aspiration Risk  Moderate aspiration risk    Diet Recommendation Regular;Thin liquid   Liquid Administration via: Cup;Straw Medication Administration: Whole meds with puree Supervision: Full supervision/cueing for compensatory strategies Compensations: Slow rate;Small sips/bites Postural Changes: Seated upright at 90 degrees    Other  Recommendations Oral Care Recommendations: Oral care BID   Follow up Recommendations  None    Frequency and Duration min 1 x/week  2 weeks       Prognosis        Swallow Study   General HPI: 82 y.o. female with history of hypertension, hyperlipidemia, GERD, hypothyroidism, dementia chronic kidney disease stage III, colitis, and esophageal stricture status post dilatation presenting with 2-3 days of right-sided weakness with fall at home.  CT subacute dominant left posterior frontal lobe infarct.  04/02/15 esophagram revealed severe esophageal motility disorder, with extensive tertiary Type of Study: Bedside Swallow Evaluation Diet Prior to this Study: NPO Temperature Spikes Noted: Yes Respiratory Status: Room air History of Recent Intubation: No Behavior/Cognition: Doesn't follow directions;Lethargic/Drowsy Oral Cavity Assessment: Dry Oral Care Completed by SLP: No Oral Cavity - Dentition: Dentures, top;Dentures, bottom Vision: Functional for self-feeding Self-Feeding Abilities: Total assist Patient Positioning: Upright in bed Baseline Vocal Quality: Not observed Volitional Cough: Cognitively unable to elicit Volitional  Swallow: Unable to elicit    Oral/Motor/Sensory Function Overall Oral Motor/Sensory Function: Other (comment) (doesnt follow oral motor commands)   Ice Chips     Thin Liquid  Thin Liquid: Within functional limits Presentation: Cup;Straw    Nectar Thick Nectar Thick Liquid: Not tested   Honey Thick Honey Thick Liquid: Not tested   Puree Puree: Within functional limits   Solid   GO   Solid: Within functional limits       [facility] Transplant CenterBonnie Suzi Hernan, MA CCC-SLP [phone]  Claudine MoutonDeBlois, Abbagail Scaff Caroline 01/10/2016,12:09 PM

## 2016-01-10 NOTE — Progress Notes (Signed)
rn paged md about pts temperature

## 2016-01-10 NOTE — Progress Notes (Signed)
rn came in to give pt her medication.pt had fallen asleep with scrambed eggs in her mouth. scrambed eggs were cleared from pts mouth, even with encouragement pt would not eat or swallow eggs. Pt seemed to do fine with applesauce. Will page md to ask if another speech eval is needed.

## 2016-01-10 NOTE — Progress Notes (Signed)
md paged about chest xray results.

## 2016-01-10 NOTE — Progress Notes (Signed)
[facility] PROGRESS NOTE  [name] ZOX:[id number] DOB: 1931-08-29 DOA: 01/07/2016 PCP: Abigail Miyamoto, MD  Brief narrative 82 year old female with history of hypertension, TIA, Alzheimer's dementia, she daily stage III, GERD, hypothyroidism . Patient had a fall at home 3 days prior to admission and was taken to [facility] ED where she was diagnosed with UTI and given IV antibiotics and then discharged on oral ciprofloxacin. However symptoms did not improve. (At baseline patient communicates and is able to ambulate in and around the house and feed herself). Patient appeared to be more confused than usual and unable to get out of bed or feet herself. Family called her PCP and then brought her to [facility] ED. In the ED her vitals were stable. Blood work showed WBC of 10.3, lactate of 1.73, UA was normal, creatinine of 1.08. X-ray of the right elbow, right forearm and right humerus were negative for any fractures. Head CT showed atrophy and a new low-attenuation in the posterior left frontal lobe consistent with subacute stroke. Admitted to hospital service for stroke workup and neurology consulted. Since symptoms had occurred at least 72 hours prior to presentation patient was out of therapeutic window for TPA.   Assessment/Plan: Subacute left posterior frontal lobe infarct - residual right hemiparesis and worsening confusion. Suspect embolic stroke. MRI brain shows small-to-moderate volume of acute infarction in the high left frontal lobe predominantly involving left ACA territory. Also has moderate to severe proximal left A2 ACA stenosis, bilateral proximal PCA and severe left MCA branch vessel stenosis. Also shows a chronic pontine infarct. -Right Sided weakness slowly improving but has severe aphasia and worsening confusion due to frontal lobe involvement.  2-D echo with  no source of emboli.. CT angiogram of the neck shows mild carotid artery atherosclerosis without stenosis. LDL of  121. -A1c 5.4. -Seen by speech and swallow and recommend regular diet with thin liquids. Administer whole medications with puree. However falling sleep while eating and now has fever and makes me concerned of aspiration. Keep her nothing by mouth and request repeat swallow evaluation. Check chest x-ray. Continue neuro checks. Stroke team following. Given her dementia and fall risk she is not a candidate for anticoagulation. Started on full dose aspirin and Lipitor. Allow permissive blood pressure. -CIR Consulted.  Fever Patient appears confused and falling asleep this morning with her mouth. Concerning for aspiration. Her nothing by mouth. Obtain chest and UA.  GERD Continue Protonix    hypothyroidism  continue Synthroid.  TSH normal.  CK D stage III Renal function stable.  Alzheimer's dementia Symptoms worsen with new frontal stroke and aphasia.  DVT prophylaxis: Subcutaneous Lovenox Diet: Heart healthy  Code Status: Full code Family Communication: none  at bedside Disposition Plan: Pending CIR evalaution   Consultants:  Neurology  Procedures:  CT head  MRI brain/MRA head  2-D echo  Carotid Doppler  Antibiotics:  None  HPI/Subjective: Seen and examined. Patient remains aphasic and poorly communicative. Appears more somnolent today.  Objective: Filed Vitals:   01/10/16 1040 01/10/16 1055  BP: 148/92   Pulse: 88   Temp: 100.3 F (37.9 C) 100.7 F (38.2 C)  Resp: 18    No intake or output data in the 24 hours ending 01/10/16 1136 There were no vitals filed for this visit.  Exam:   General:   nonverbal, aphasic, somnolent  HEENT:left facial droop  Chest: Clear bilaterally  Cardiovascular: Normal S1 and S2, no murmurs rub or gallop  Abdomen: , Nondistended, nontender, bowel sounds  present  Musculoskeletal: Warm, no edema   CNS: Nonverbal, flat affect,left  facial droop , right hemiparesis, aphasic  Data Reviewed: Basic Metabolic  Panel:  Recent Labs Lab 01/07/16 1824  NA 141  K 4.7  CL 109  CO2 19*  GLUCOSE 103*  BUN 12  CREATININE 1.08*  CALCIUM 8.6*   Liver Function Tests:  Recent Labs Lab 01/07/16 1824  AST 44*  ALT 24  ALKPHOS 82  BILITOT 1.3*  PROT 6.1*  ALBUMIN 3.2*   No results for input(s): LIPASE, AMYLASE in the last 168 hours. No results for input(s): AMMONIA in the last 168 hours. CBC:  Recent Labs Lab 01/07/16 1824  WBC 10.3  NEUTROABS 6.3  HGB 11.0*  HCT 33.8*  MCV 92.1  PLT 208   Cardiac Enzymes: No results for input(s): CKTOTAL, CKMB, CKMBINDEX, TROPONINI in the last 168 hours. BNP (last 3 results) No results for input(s): BNP in the last 8760 hours.  ProBNP (last 3 results) No results for input(s): PROBNP in the last 8760 hours.  CBG:  Recent Labs Lab 01/08/16 2211  GLUCAP 119*    Recent Results (from the past 240 hour(s))  Urine culture     Status: None   Collection Time: 01/07/16  8:06 PM  Result Value Ref Range Status   Specimen Description URINE, CATHETERIZED  Final   Special Requests NONE  Final   Culture NO GROWTH 2 DAYS  Final   Report Status 01/09/2016 FINAL  Final     Studies: Ct Angio Neck W/cm &/or Wo/cm  01/09/2016  CLINICAL DATA:  Left cerebral hemispheric stroke. Right-sided hemi paresis and aphasia. EXAM: CT ANGIOGRAPHY NECK TECHNIQUE: Multidetector CT imaging of the neck was performed using the standard protocol during bolus administration of intravenous contrast. Multiplanar CT image reconstructions and MIPs were obtained to evaluate the vascular anatomy. Carotid stenosis measurements (when applicable) are obtained utilizing NASCET criteria, using the distal internal carotid diameter as the denominator. CONTRAST:  OMNIPAQUE IOHEXOL 350 MG/ML SOLN COMPARISON:  None. FINDINGS: Aortic arch: 3 vessel aortic arch with mild-to-moderate aortic plaque. Mild atheromatous plaque is present in the brachiocephalic artery without significant  stenosis. There is mild narrowing of the proximal left subclavian artery due to calcified plaque. Right carotid system: Patent with mild non stenotic plaque in the proximal ICA. Left carotid system: Patent with mild non stenotic plaque in the mid and distal common and proximal internal carotid arteries. Vertebral arteries:Vertebral arteries are patent with the left being dominant. No vertebral artery stenosis is seen. Skeleton: Mild cervical spondylosis. Other neck: Mild pleural parenchymal scarring in the lung apices. 1.1 cm short axis lymph node anterior to the proximal right mainstem bronchus is unchanged from a 02/27/2015 chest CT. IMPRESSION: 1. Mild cervical carotid artery atherosclerosis without stenosis. 2. Widely patent vertebral arteries. Electronically Signed   By: Sebastian Ache M.D.   On: 01/09/2016 08:18   Mr Brain Wo Contrast  01/08/2016  CLINICAL DATA:  Stroke.  Right hemiparesis and aphasia. EXAM: MRI HEAD WITHOUT CONTRAST MRA HEAD WITHOUT CONTRAST TECHNIQUE: Multiplanar, multiecho pulse sequences of the brain and surrounding structures were obtained without intravenous contrast. Angiographic images of the head were obtained using MRA technique without contrast. COMPARISON:  Head CT 01/07/2016 FINDINGS: MRI HEAD FINDINGS Multiple areas of restricted diffusion are seen in the left frontal lobe consistent with acute infarcts. The most confluent area is small to moderate in size and involves the posteromedial left frontal lobe, cingulate gyrus, and body of the corpus  callosum in the distal ACA distribution. Additional smaller foci of acute cortical and subcortical infarction are noted more anteriorly and laterally in the left frontal lobe, possibly ACA/ MCA watershed. There is associated cytotoxic edema without evidence of hemorrhage or mass effect. There is moderate cerebral atrophy. A small, chronic infarct is noted in the left paracentral pons. Patchy to confluent T2 hyperintensities in the  subcortical and deep cerebral white matter and brainstem are compatible with moderate chronic small vessel ischemic disease. Chronic small vessel ischemic changes are also noted in the basal ganglia bilaterally. Prior bilateral cataract extraction is noted. There are small right and trace left mastoid effusions. No significant paranasal sinus disease. Major intracranial vascular flow voids are preserved. MRA HEAD FINDINGS The visualized distal vertebral arteries are patent without stenosis. Left vertebral artery is mildly dominant. PICA, AICA, and SCA origins are patent. Basilar artery is patent without stenosis. There is a fetal type origin of the left PCA. A right posterior communicating artery is not identified. There is a severe right PCA stenosis near the P1 - P2 junction. There is also moderate to severe narrowing of the proximal left P2 segment. Moderate PCA branch vessel irregularity and narrowing are present bilaterally. Internal carotid arteries are patent from skullbase to carotid termini without stenosis. Slightly bulbous appearance of the left posterior communicating artery origin likely represents an infundibulum. MCAs are patent without evidence of significant proximal stenosis. There is asymmetric irregular left MCA branch vessel narrowing including severe focal stenoses involving distal M2 and M3 inferior division branch vessels. The A1 segments are patent without stenosis. The right A2 segment is occluded proximally. There is a moderate to severe stenosis of the proximal left A2 segment. More distal left A2 and proximal A3 segments appear heavily diseased with tandem severe stenoses and diminished flow related enhancement. The MRA does not extend superiorly to include most of the area of acute infarction. No intracranial aneurysm is identified. IMPRESSION: 1. Small to moderate volume of acute infarction in the high left frontal lobe predominantly involving the ACA territory. 2. Moderate to severe  proximal left A2 ACA stenosis. Severe tandem stenoses with decreased flow more distally in the left ACA. 3. Right ACA proximal A2 occlusion. 4. Severe bilateral proximal PCA stenoses and severe left MCA branch vessel stenoses. 5. Moderate chronic small vessel ischemic disease. 6. Chronic pontine infarct. Electronically Signed   By: Sebastian AcheAllen  Grady M.D.   On: 01/08/2016 13:05   Mr Maxine GlennMra Head/brain Wo Cm  01/08/2016  CLINICAL DATA:  Stroke.  Right hemiparesis and aphasia. EXAM: MRI HEAD WITHOUT CONTRAST MRA HEAD WITHOUT CONTRAST TECHNIQUE: Multiplanar, multiecho pulse sequences of the brain and surrounding structures were obtained without intravenous contrast. Angiographic images of the head were obtained using MRA technique without contrast. COMPARISON:  Head CT 01/07/2016 FINDINGS: MRI HEAD FINDINGS Multiple areas of restricted diffusion are seen in the left frontal lobe consistent with acute infarcts. The most confluent area is small to moderate in size and involves the posteromedial left frontal lobe, cingulate gyrus, and body of the corpus callosum in the distal ACA distribution. Additional smaller foci of acute cortical and subcortical infarction are noted more anteriorly and laterally in the left frontal lobe, possibly ACA/ MCA watershed. There is associated cytotoxic edema without evidence of hemorrhage or mass effect. There is moderate cerebral atrophy. A small, chronic infarct is noted in the left paracentral pons. Patchy to confluent T2 hyperintensities in the subcortical and deep cerebral white matter and brainstem are compatible with moderate  chronic small vessel ischemic disease. Chronic small vessel ischemic changes are also noted in the basal ganglia bilaterally. Prior bilateral cataract extraction is noted. There are small right and trace left mastoid effusions. No significant paranasal sinus disease. Major intracranial vascular flow voids are preserved. MRA HEAD FINDINGS The visualized distal vertebral  arteries are patent without stenosis. Left vertebral artery is mildly dominant. PICA, AICA, and SCA origins are patent. Basilar artery is patent without stenosis. There is a fetal type origin of the left PCA. A right posterior communicating artery is not identified. There is a severe right PCA stenosis near the P1 - P2 junction. There is also moderate to severe narrowing of the proximal left P2 segment. Moderate PCA branch vessel irregularity and narrowing are present bilaterally. Internal carotid arteries are patent from skullbase to carotid termini without stenosis. Slightly bulbous appearance of the left posterior communicating artery origin likely represents an infundibulum. MCAs are patent without evidence of significant proximal stenosis. There is asymmetric irregular left MCA branch vessel narrowing including severe focal stenoses involving distal M2 and M3 inferior division branch vessels. The A1 segments are patent without stenosis. The right A2 segment is occluded proximally. There is a moderate to severe stenosis of the proximal left A2 segment. More distal left A2 and proximal A3 segments appear heavily diseased with tandem severe stenoses and diminished flow related enhancement. The MRA does not extend superiorly to include most of the area of acute infarction. No intracranial aneurysm is identified. IMPRESSION: 1. Small to moderate volume of acute infarction in the high left frontal lobe predominantly involving the ACA territory. 2. Moderate to severe proximal left A2 ACA stenosis. Severe tandem stenoses with decreased flow more distally in the left ACA. 3. Right ACA proximal A2 occlusion. 4. Severe bilateral proximal PCA stenoses and severe left MCA branch vessel stenoses. 5. Moderate chronic small vessel ischemic disease. 6. Chronic pontine infarct. Electronically Signed   By: Sebastian Ache M.D.   On: 01/08/2016 13:05    Scheduled Meds: .  stroke: mapping our early stages of recovery book   Does  not apply Once  . aspirin  300 mg Rectal Daily   Or  . aspirin  325 mg Oral Daily  . atorvastatin  40 mg Oral q1800  . enoxaparin (LOVENOX) injection  40 mg Subcutaneous Q24H  . fluticasone  1 spray Each Nare Daily  . levothyroxine  25 mcg Oral QAC breakfast  . memantine  28 mg Oral Daily  . pantoprazole  40 mg Oral Daily   Continuous Infusions: . sodium chloride 75 mL/hr at 01/09/16 1519      Time spent: 25 minutes    Jamaar Howes  [facility] Pager [phone] If 7PM-7AM, please contact night-coverage at [email or web address], password [facility] 01/10/2016, 11:36 AM  LOS: 3 days

## 2016-01-11 DIAGNOSIS — E785 Hyperlipidemia, unspecified: Secondary | ICD-10-CM | POA: Diagnosis present

## 2016-01-11 DIAGNOSIS — D72829 Elevated white blood cell count, unspecified: Secondary | ICD-10-CM

## 2016-01-11 DIAGNOSIS — F0280 Dementia in other diseases classified elsewhere without behavioral disturbance: Secondary | ICD-10-CM

## 2016-01-11 DIAGNOSIS — K222 Esophageal obstruction: Secondary | ICD-10-CM

## 2016-01-11 DIAGNOSIS — G309 Alzheimer's disease, unspecified: Secondary | ICD-10-CM

## 2016-01-11 DIAGNOSIS — N183 Chronic kidney disease, stage 3 (moderate): Secondary | ICD-10-CM

## 2016-01-11 DIAGNOSIS — E039 Hypothyroidism, unspecified: Secondary | ICD-10-CM

## 2016-01-11 DIAGNOSIS — J209 Acute bronchitis, unspecified: Secondary | ICD-10-CM

## 2016-01-11 DIAGNOSIS — I10 Essential (primary) hypertension: Secondary | ICD-10-CM

## 2016-01-11 DIAGNOSIS — J690 Pneumonitis due to inhalation of food and vomit: Secondary | ICD-10-CM

## 2016-01-11 DIAGNOSIS — I63032 Cerebral infarction due to thrombosis of left carotid artery: Secondary | ICD-10-CM | POA: Diagnosis present

## 2016-01-11 LAB — GLUCOSE, CAPILLARY
GLUCOSE-CAPILLARY: 135 mg/dL — AB (ref 65–99)
Glucose-Capillary: 103 mg/dL — ABNORMAL HIGH (ref 65–99)
Glucose-Capillary: 101 mg/dL — ABNORMAL HIGH (ref 65–99)

## 2016-01-11 MED ORDER — SODIUM CHLORIDE 0.9 % IV SOLN
1.5000 g | Freq: Four times a day (QID) | INTRAVENOUS | Status: DC
  Administered 2016-01-11 – 2016-01-12 (×5): 1.5 g via INTRAVENOUS
  Filled 2016-01-11 (×9): qty 1.5
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
## 2016-01-11 NOTE — Progress Notes (Signed)
Speech Language Pathology Treatment: Dysphagia  Patient Details Name: Kayla SantiagoMary C Funez MRN: [id number] DOB: 1931-08-09 Today's Date: 01/11/2016 Time:  - 1030-1100: 30 minutes    Assessment / Plan / Recommendation Clinical Impression  F/u for swallowing: pt has had intermittent difficulty with eating and swallowing safely over the w/e, related primarily to lethargy.  Today, pt is sitting in recliner, awake, talkative, with daughter present.  Provided with thin liquids and regular solids.  Pt's performance is c/w initial evaluation on Friday.  Pt is able to feed herself with adequate mastication, swift swallow response, no s/s of aspiration, even when taxed with large, successive boluses.   Had long discussion with pt's daughter re: dementia and its impact upon swallowing, both in the short and long-term.  We discussed swallowing and its fluctuating nature and dependence on overall medical condition; we discussed the potential for aspiration, its increasing frequency as dementia progresses, and the inability to prevent aspiration, only minimize it with strategies/diet modification.  We discussed the benefits/burdens of PEGs and their contraindications in dementia.  Pt's daughter, [name], is not POA but would like to pursue the necessary documentation to make that happen.  D/W RN, who put in order for Chaplain's service to assist pt's daughter to that end.    No further SLP f/u is warranted.     HPI HPI: 82 y.o. female with history of hypertension, hyperlipidemia, GERD, hypothyroidism, dementia chronic kidney disease stage III, colitis, and esophageal stricture status post dilatation presenting with 2-3 days of right-sided weakness with fall at home.  CT subacute dominant left posterior frontal lobe infarct.  04/02/15 esophagram revealed severe esophageal motility disorder, with extensive tertiary contractions.       SLP Plan        Recommendations   regular diet, thin liquids   No SLP f/u                 GO               [name] [name], [place]MA CCC/SLP Pager [phone]  Kayla MountsCouture, [name] 01/11/2016, 12:57 PM

## 2016-01-11 NOTE — Progress Notes (Signed)
[facility] PROGRESS NOTE  [name] YNW:[id number] DOB: 12-30-1930 DOA: 01/07/2016 PCP: Abigail Miyamoto, MD  Brief narrative 82 year old female with history of hypertension, TIA, Alzheimer's dementia, she daily stage III, GERD, hypothyroidism . Patient had a fall at home 3 days prior to admission and was taken to [facility] ED where she was diagnosed with UTI and given IV antibiotics and then discharged on oral ciprofloxacin. However symptoms did not improve. (At baseline patient communicates and is able to ambulate in and around the house and feed herself). Patient appeared to be more confused than usual and unable to get out of bed or feet herself. Family called her PCP and then brought her to [facility] ED. In the ED her vitals were stable. Blood work showed WBC of 10.3, lactate of 1.73, UA was normal, creatinine of 1.08. X-ray of the right elbow, right forearm and right humerus were negative for any fractures. Head CT showed atrophy and a new low-attenuation in the posterior left frontal lobe consistent with subacute stroke. Admitted to hospital service for stroke workup and neurology consulted. Since symptoms had occurred at least 72 hours prior to presentation patient was out of therapeutic window for TPA.   Assessment/Plan: Subacute left posterior frontal lobe infarct - residual right hemiparesis and worsening confusion. Suspect embolic stroke. MRI brain shows small-to-moderate volume of acute infarction in the high left frontal lobe predominantly involving left ACA territory. Also has moderate to severe proximal left A2 ACA stenosis, bilateral proximal PCA and severe left MCA branch vessel stenosis. Also shows a chronic pontine infarct. -Right Sided weakness slowly improving but has severe aphasia and worsening confusion due to frontal lobe involvement.  2-D echo with  no source of emboli.. CT angiogram of the neck shows mild carotid artery atherosclerosis without stenosis. LDL of  121. -A1c 5.4. -Seen by speech and swallow and recommend regular diet with thin liquids. Administer whole medications with puree. However falling sleep while eating and now has fever and makes me concerned of aspiration. Keep her nothing by mouth and request repeat swallow evaluation. Check chest x-ray. Continue neuro checks. Stroke team following. Given her dementia and fall risk she is not a candidate for anticoagulation. Started on full dose aspirin and Lipitor. Allow permissive blood pressure. -CIR Consulted.  Aspiration pneumonia Pt became more lethargic and febrile on 3/12. CXR showed LLL infiltrate. Started on zosyn, switched to unasyn this am.  Seen by swallow eval. who presented dementia affects her swelling to a great extent. Today she was able to swallow with good response. Recommends a regular diet for now. Will monitor.  GERD Continue Protonix    hypothyroidism  continue Synthroid.  TSH normal.  CK D stage III Renal function stable.  Alzheimer's dementia Symptoms worsen with new frontal stroke and aphasia.  DVT prophylaxis: Subcutaneous Lovenox Diet: Heart healthy  Code Status: Full code Family Communication: none  at bedside Disposition Plan: Need skilled nursing facility. Possibly can be discharged on 3/14   Consultants:  Neurology  Procedures:  CT head  MRI brain/MRA head  2-D echo  Carotid Doppler  Antibiotics:  None  HPI/Subjective: Seen and examined. Still aphasic and poorly communicative.   Objective: Filed Vitals:   01/11/16 0544 01/11/16 0946  BP: 159/68 135/63  Pulse: 75 80  Temp: 98.5 F (36.9 C) 98.7 F (37.1 C)  Resp: 18 18    Intake/Output Summary (Last 24 hours) at 01/11/16 1343 Last data filed at 01/11/16 0853  Gross per 24 hour  Intake  50 ml  Output      0 ml  Net     50 ml   Filed Weights   01/07/16 2200  Weight: 68.901 kg (151 lb 14.4 oz)    Exam:   General:   nonverbal, aphasic, somnolent  HEENT:left  facial droop  Chest: Clear bilaterally  Cardiovascular: Normal S1 and S2, no murmurs rub or gallop  Abdomen: , Nondistended, nontender, bowel sounds present  Musculoskeletal: Warm, no edema   CNS: Nonverbal, flat affect,left  facial droop , right hemiparesis, aphasic  Data Reviewed: Basic Metabolic Panel:  Recent Labs Lab 01/07/16 1824 01/10/16 1141  NA 141 140  K 4.7 3.6  CL 109 107  CO2 19* 20*  GLUCOSE 103* 121*  BUN 12 10  CREATININE 1.08* 0.90  CALCIUM 8.6* 8.3*   Liver Function Tests:  Recent Labs Lab 01/07/16 1824  AST 44*  ALT 24  ALKPHOS 82  BILITOT 1.3*  PROT 6.1*  ALBUMIN 3.2*   No results for input(s): LIPASE, AMYLASE in the last 168 hours. No results for input(s): AMMONIA in the last 168 hours. CBC:  Recent Labs Lab 01/07/16 1824 01/10/16 1141  WBC 10.3 13.2*  NEUTROABS 6.3  --   HGB 11.0* 12.7  HCT 33.8* 38.4  MCV 92.1 92.1  PLT 208 269   Cardiac Enzymes: No results for input(s): CKTOTAL, CKMB, CKMBINDEX, TROPONINI in the last 168 hours. BNP (last 3 results) No results for input(s): BNP in the last 8760 hours.  ProBNP (last 3 results) No results for input(s): PROBNP in the last 8760 hours.  CBG:  Recent Labs Lab 01/10/16 2010 01/10/16 2358 01/11/16 0403 01/11/16 0754 01/11/16 1154  GLUCAP 102* 112* 103* 101* 135*    Recent Results (from the past 240 hour(s))  Urine culture     Status: None   Collection Time: 01/07/16  8:06 PM  Result Value Ref Range Status   Specimen Description URINE, CATHETERIZED  Final   Special Requests NONE  Final   Culture NO GROWTH 2 DAYS  Final   Report Status 01/09/2016 FINAL  Final  Culture, blood (routine x 2)     Status: None (Preliminary result)   Collection Time: 01/10/16  3:30 PM  Result Value Ref Range Status   Specimen Description BLOOD RIGHT ANTECUBITAL  Final   Special Requests BOTTLES DRAWN AEROBIC AND ANAEROBIC 10CC  Final   Culture NO GROWTH < 24 HOURS  Final   Report Status  PENDING  Incomplete  Culture, blood (routine x 2)     Status: None (Preliminary result)   Collection Time: 01/10/16  3:30 PM  Result Value Ref Range Status   Specimen Description BLOOD RIGHT HAND  Final   Special Requests BOTTLES DRAWN AEROBIC AND ANAEROBIC  Final   Culture NO GROWTH < 24 HOURS  Final   Report Status PENDING  Incomplete     Studies: Dg Chest Port 1 View  01/10/2016  CLINICAL DATA:  82 year old female recently victim of the stroke. Nonresponsive. Febrile. EXAM: PORTABLE CHEST 1 VIEW COMPARISON:  Chest x-ray 02/27/2015. FINDINGS: Lung volumes are low. Mild diffuse peribronchial cuffing. Consolidation in the medial aspect of the left lower lobe partially obscuring the medial left hemidiaphragm. No definite pleural effusions. No evidence of pulmonary edema. Heart size is normal. The patient is rotated to the left on today's exam, resulting in distortion of the mediastinal contours and reduced diagnostic sensitivity and specificity for mediastinal pathology. Atherosclerosis in the thoracic aorta. IMPRESSION: 1. The appearance  of the chest suggests bronchitis, likely with left lower lobe pneumonia. Followup PA and lateral chest X-ray is recommended in 3-4 weeks following trial of antibiotic therapy to ensure resolution and exclude underlying malignancy. 2. Atherosclerosis. Electronically Signed   By: Trudie Reedaniel  Entrikin M.D.   On: 01/10/2016 13:31    Scheduled Meds: .  stroke: mapping our early stages of recovery book   Does not apply Once  . ampicillin-sulbactam (UNASYN) IV  1.5 g Intravenous Q6H  . aspirin  300 mg Rectal Daily   Or  . aspirin  325 mg Oral Daily  . atorvastatin  40 mg Oral q1800  . enoxaparin (LOVENOX) injection  40 mg Subcutaneous Q24H  . fluticasone  1 spray Each Nare Daily  . levothyroxine  25 mcg Oral QAC breakfast  . memantine  28 mg Oral Daily  . pantoprazole  40 mg Oral Daily  . rivastigmine  9.5 mg Transdermal Q24H   Continuous Infusions: . sodium  chloride 75 mL/hr at 01/10/16 2044      Time spent: 25 minutes    Eddie NorthDHUNGEL, Abdinasir Spadafore  [facility] Pager [phone] If 7PM-7AM, please contact night-coverage at [email or web address], password [email or web address] 01/11/2016, 1:43 PM  LOS: 4 days

## 2016-01-11 NOTE — Progress Notes (Signed)
Pharmacy Antibiotic Note  [name] is a 82 y.o. female admitted on 01/07/2016 with aspiration pneumonia.  Pharmacy has been consulted to change Zosyn to Unasyn.  Plan: Unasyn 1.5g IV Q6H.  Height: 5\' 5"  (165.1 cm) Weight: 151 lb 14.4 oz (68.901 kg) IBW/kg (Calculated) : 57  Temp (24hrs), Avg:100 F (37.8 C), Min:98 F (36.7 C), Max:101.1 F (38.4 C)   Recent Labs Lab 01/07/16 1824 01/07/16 1842 01/10/16 1141  WBC 10.3  --  13.2*  CREATININE 1.08*  --  0.90  LATICACIDVEN  --  1.73  --     Estimated Creatinine Clearance: 46.2 mL/min (by C-G formula based on Cr of 0.9).    No Known Allergies   Thank you for allowing pharmacy to be a part of this patient's care.  [name], PharmD, BCPS  01/11/2016 7:28 AM

## 2016-01-11 NOTE — Progress Notes (Signed)
   01/11/16 1355  Clinical Encounter Type  Visited With Patient and family together;Health care provider  Visit Type Initial  Referral From Nurse;Family  Spiritual Encounters  Spiritual Needs Literature;Prayer  Stress Factors  Family Stress Factors Health changes;Exhausted   Chaplain responded to a request to complete an advanced directive. According to the patient's daughter, patient is feeling more confused after the stroke and is not mentally competent to make decisions and sign official documents. Patient does have a husband, but according to the daughter they are patient and husband are legally separated. Patient's daughter has been the primary caregiver for years and is the one making decisions for the patient at this point.  Patient has also filled out some HCPOA paperwork at one of her outpatient clinics. Chaplain encouraged patient and patient's daughter to maintain the status quo and to retrieve copies of that paperwork from the clinic. Chaplain also offered prayer and support, and our services are available as needed.   Alda PonderAdam M Jeryl Umholtz, Chaplain 01/11/2016 1:59 PM

## 2016-01-11 NOTE — Care Management Note (Signed)
Case Management Note  Patient Details  Name: Lawrence SantiagoMary C Underberg MRN: [id number] Date of Birth: 1931/03/13  Subjective/Objective:                    Action/Plan: PT/OT recommending CIR. CM continuing to follow for d/c needs.   Expected Discharge Date:                  Expected Discharge Plan:     In-House Referral:     Discharge planning Services     Post Acute Care Choice:    Choice offered to:     DME Arranged:    DME Agency:     HH Arranged:    HH Agency:     Status of Service:  In process, will continue to follow  Medicare Important Message Given:    Date Medicare IM Given:    Medicare IM give by:    Date Additional Medicare IM Given:    Additional Medicare Important Message give by:     If discussed at Long Length of Stay Meetings, dates discussed:    Additional Comments:  Kermit BaloKelli F Julianna Vanwagner, RN 01/11/2016, 11:34 AM

## 2016-01-11 NOTE — [place] FL2 (Signed)
[place] MEDICAID FL2 LEVEL OF CARE SCREENING TOOL     IDENTIFICATION  Patient Name: Kayla SantiagoMary C Schroeder Birthdate: [birth year] Sex: female Admission Date (Current Location): 01/07/2016  [facility] [place]   Facility and Address:  [facility], [address], [place], [place] [address]      Provider Number: [id number]  Attending Physician Name and Address:  Eddie NorthNishant Dhungel, MD  Relative Name and Phone Number:       Current Level of Care: Hospital Recommended Level of Care: Skilled Nursing Facility Prior Approval Number:    Date Approved/Denied:   PASRR Number:    Discharge Plan: SNF    Current Diagnoses: Patient Active Problem List   Diagnosis Date Noted  . Intracranial vascular stenosis   . Acute embolic stroke (HCC)   . UTI (lower urinary tract infection) 01/07/2016  . Stroke (cerebrum) (HCC) 01/07/2016  . Alzheimer's dementia 03/26/2015  . GERD (gastroesophageal reflux disease) 03/26/2015  . HTN (hypertension) 03/26/2015  . CKD (chronic kidney disease), stage III 03/26/2015  . S/P cholecystectomy 03/26/2015    Orientation RESPIRATION BLADDER Height & Weight     Self  Normal Incontinent Weight: 151 lb 14.4 oz (68.901 kg) Height:  5\' 5"  (165.1 cm)  BEHAVIORAL SYMPTOMS/MOOD NEUROLOGICAL BOWEL NUTRITION STATUS   (NONE )  (NONE ) Continent Diet (REGULAR )  AMBULATORY STATUS COMMUNICATION OF NEEDS Skin   Extensive Assist Non-Verbally Normal                       Personal Care Assistance Level of Assistance  Bathing, Feeding, Dressing Bathing Assistance: Maximum assistance Feeding assistance: Limited assistance Dressing Assistance: Maximum assistance     Functional Limitations Info  Sight, Hearing, Speech Sight Info: Adequate Hearing Info: Adequate Speech Info: Adequate    SPECIAL CARE FACTORS FREQUENCY  PT (By licensed PT)     PT Frequency: 4 OT Frequency: 3            Contractures      Additional  Factors Info  Code Status, Allergies Code Status Info: FULL CODE  Allergies Info: N/A           Current Medications (01/11/2016):  This is the current hospital active medication list Current Facility-Administered Medications  Medication Dose Route Frequency Provider Last Rate Last Dose  .  stroke: mapping our early stages of recovery book   Does not apply Once Lorretta HarpXilin Niu, MD      . 0.9 %  sodium chloride infusion   Intravenous Continuous Lorretta HarpXilin Niu, MD 75 mL/hr at 01/10/16 2044    . ampicillin-sulbactam (UNASYN) 1.5 g in sodium chloride 0.9 % 50 mL IVPB  1.5 g Intravenous Q6H Veronda P Bryk, RPH   1.5 g at 01/11/16 0853  . aspirin suppository 300 mg  300 mg Rectal Daily Lorretta HarpXilin Niu, MD   300 mg at 01/11/16 [id number]   Or  . aspirin tablet 325 mg  325 mg Oral Daily Lorretta HarpXilin Niu, MD   Stopped at 01/10/16 1000  . atorvastatin (LIPITOR) tablet 40 mg  40 mg Oral q1800 Lorretta HarpXilin Niu, MD   Stopped at 01/10/16 1800  . enoxaparin (LOVENOX) injection 40 mg  40 mg Subcutaneous Q24H Lorretta HarpXilin Niu, MD   40 mg at 01/10/16 2039  . fluticasone (FLONASE) 50 MCG/ACT nasal spray 1 spray  1 spray Each Nare Daily Lorretta HarpXilin Niu, MD   1 spray at 01/11/16 0854  . levothyroxine (SYNTHROID, LEVOTHROID) tablet 25 mcg  25 mcg Oral QAC breakfast Lorretta Harp, MD   25 mcg at 01/10/16 [phone]  . memantine (NAMENDA XR) 24 hr capsule 28 mg  28 mg Oral Daily Lorretta Harp, MD   Stopped at 01/10/16 1000  . naphazoline-pheniramine (NAPHCON-A) 0.025-0.3 % ophthalmic solution 1 drop  1 drop Both Eyes Daily PRN Lorretta Harp, MD      . pantoprazole (PROTONIX) EC tablet 40 mg  40 mg Oral Daily Lorretta Harp, MD   Stopped at 01/10/16 1000  . rivastigmine (EXELON) 9.5 mg/24hr 9.5 mg  9.5 mg Transdermal Q24H Nishant Dhungel, MD   9.5 mg at 01/10/16 1714  . senna-docusate (Senokot-S) tablet 1 tablet  1 tablet Oral QHS PRN Lorretta Harp, MD         Discharge Medications: Please see discharge summary for a list of discharge medications.  Relevant Imaging Results:  Relevant  Lab Results:   Additional Information SSN [id number]  Derenda Fennel, MSW, LCSWA [phone] 01/11/2016 10:29 AM

## 2016-01-11 NOTE — Care Management Important Message (Signed)
Important Message  Patient Details  Name: [name] MRN: [id number] Date of Birth: November 02, 1930   Medicare Important Message Given:  Other (see comment) (not given, per ncm)    [name] [name] 01/11/2016, 12:24 PM

## 2016-01-11 NOTE — Progress Notes (Signed)
Per attending MD, patient will need to be NPO at this time until SLP evaluation. Patient noted not following command or conversant with RN during round. She appears more alert after and did participated with assessment after sitting in chair. Family at bedside now with update. Will continue to monitor.   Sim BoastHavy, RN
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
## 2016-01-11 NOTE — Progress Notes (Addendum)
Occupational Therapy Treatment Patient Details Name: Kayla SantiagoMary C Rueth MRN: [id number] DOB: May 13, 1931 Today's Date: 01/11/2016    History of present illness 82 y.o. female with history of ypertension, hyperlipidemia, GERD, hypothyroidism, dementia chronic kidney disease stage III, colitis, and esophageal stricture status post dilatation presenting with 2-3 days of right-sided weakness with fall at home. CT subacute dominant left posterior frontal lobe infarct   OT comments  Pt progressing. Continue to recommend CIR and will continue to follow acutely.   Follow Up Recommendations  CIR    Equipment Recommendations  Other (comment) (defer to next venue)    Recommendations for Other Services      Precautions / Restrictions Precautions Precautions: Fall Precaution Comments: R hemiparesis Restrictions Weight Bearing Restrictions: No       Mobility Bed Mobility Overal bed mobility: Needs Assistance Bed Mobility: Sit to Supine       Sit to supine: Max assist   General bed mobility comments: assist with LEs when going to bed and assist given to adjust in bed and [facility] Trendelenburg position used.   Transfers Overall transfer level: Needs assistance   Transfers: Sit to/from Stand;Stand Pivot Transfers Sit to Stand: Max assist;+2 physical assistance Stand pivot transfers: +2 physical assistance;Mod assist            Balance      Assist for standing balance and functional transfers.                             ADL Overall ADL's : Needs assistance/impaired     Grooming: Applying deodorant;Brushing hair; washing face;Sitting;Moderate assistance Grooming Details (indicate cue type and reason): Min assist for brushing hair; total A for deodorant; setup/supervision for washing face                 Toilet Transfer: +2 for physical assistance;Stand-pivot (+2 Max assist from chair to bed; +2 Mod assist-stand pivot)           Functional mobility  during ADLs: Moderate assistance+2 physical assist for stand pivot transfer General ADL Comments: OT gave cues and assist for self care tasks.      Vision                     Perception     Praxis      Cognition  Awake/Alert Behavior During Therapy: Flat affect Overall Cognitive Status:  (unsure of baseline) Area of Impairment: Attention;Following commands;Problem solving;Orientation Orientation Level:  (unable to state where we were at) Current Attention Level: Focused    Following Commands: Follows one step commands inconsistently (follows commands with increased time)     Problem Solving: Slow processing;Requires verbal cues;Requires tactile cues      Extremity/Trunk Assessment               Exercises     Shoulder Instructions       General Comments      Pertinent Vitals/ Pain       Pain Assessment: No/denies pain  Home Living                                          Prior Functioning/Environment              Frequency Min 3X/week     Progress Toward Goals  OT Goals(current goals can now  be found in the care plan section)  Progress towards OT goals: Progressing toward goals  Acute Rehab OT Goals Patient Stated Goal: not stated OT Goal Formulation: With family Time For Goal Achievement: 01/16/16 Potential to Achieve Goals: Fair ADL Goals Pt Will Perform Grooming: with min assist;sitting (2 tasks at EOB) Pt Will Perform Upper Body Bathing: with min assist;sitting (EOB) Pt Will Perform Upper Body Dressing: with mod assist;sitting (EOB) Pt Will Transfer to Toilet: with min assist;ambulating;bedside commode (over toilet) Pt Will Perform Toileting - Clothing Manipulation and hygiene:  (Pt will be able to stand with Min A for help with tasks) Additional ADL Goal #1: Pt will be min A for up to EOB  for basic ADLs  Plan Discharge plan remains appropriate    Co-evaluation                 End of Session Equipment  Utilized During Treatment: Gait belt   Activity Tolerance Patient tolerated treatment well   Patient Left in bed;with call bell/phone within reach;with bed alarm set   Nurse Communication          Time: 7829-5621 OT Time Calculation (min): 15 min  Charges: OT General Charges $OT Visit: 1 Procedure OT Treatments $Self Care/Home Management : 8-22 mins   Earlie Raveling OTR/L [phone] 01/11/2016, 2:50 PM

## 2016-01-11 NOTE — Progress Notes (Signed)
Physical Therapy Treatment Patient Details Name: [name] MRN: [id number] DOB: 08-Dec-1930 Today's Date: 01/11/2016    History of Present Illness 82 y.o. female with history of ypertension, hyperlipidemia, GERD, hypothyroidism, dementia chronic kidney disease stage III, colitis, and esophageal stricture status post dilatation presenting with 2-3 days of right-sided weakness with fall at home. CT subacute dominant left posterior frontal lobe infarct    PT Comments    Pt progressing towards physical therapy goals. RN present during session and assisted with peri-care and transfers. Pt initially with decreased response to questions and decreased following of commands, however once in the chair, pt was able to appropriately follow some simple commands and appeared more alert. Will continue to follow and progress as able per POC.   Follow Up Recommendations  CIR;Supervision/Assistance - 24 hour     Equipment Recommendations  Other (comment) (TBD by next venue of care)    Recommendations for Other Services Rehab consult     Precautions / Restrictions Precautions Precautions: Fall Precaution Comments: R hemiparesis Restrictions Weight Bearing Restrictions: No    Mobility  Bed Mobility Overal bed mobility: Needs Assistance Bed Mobility: Rolling;Sidelying to Sit Rolling: Mod assist Sidelying to sit: Mod assist   Sit to supine: Max assist   General bed mobility comments: Hand-over-hand assist for reaching and holding bed rail for support. Heavy mod assist to elevate trunk to full sitting position.   Transfers Overall transfer level: Needs assistance Equipment used: 2 person hand held assist Transfers: Sit to/from Stand Sit to Stand: Max assist;+2 physical assistance Stand pivot transfers: Mod assist;+2 physical assistance       General transfer comment: Increased assist to power-up to full standing position. Pt was able to take a few pivotal steps around to the recliner  chair with support on R and L sides.   Ambulation/Gait                 Stairs            Wheelchair Mobility    Modified Rankin (Stroke Patients Only) Modified Rankin (Stroke Patients Only) Pre-Morbid Rankin Score: Moderate disability Modified Rankin: Moderately severe disability     Balance Overall balance assessment: Needs assistance Sitting-balance support: Feet supported;No upper extremity supported Sitting balance-Leahy Scale: Fair Sitting balance - Comments: required min to mod assist for sitting balance EOB Postural control: Posterior lean Standing balance support: Bilateral upper extremity supported;During functional activity Standing balance-Leahy Scale: Zero Standing balance comment: +2 assist required.                    Cognition Arousal/Alertness: Awake/alert Behavior During Therapy: Flat affect Overall Cognitive Status: Impaired/Different from baseline (unsure of baseline) Area of Impairment: Attention;Following commands;Problem solving;Orientation Orientation Level:  (unable to state where we were at) Current Attention Level: Focused Memory: Decreased short-term memory Following Commands: Follows one step commands inconsistently;Follows one step commands with increased time     Problem Solving: Slow processing;Requires verbal cues;Requires tactile cues      Exercises      General Comments        Pertinent Vitals/Pain Pain Assessment: No/denies pain Faces Pain Scale: No hurt    Home Living                      Prior Function            PT Goals (current goals can now be found in the care plan section) Acute Rehab PT Goals Patient Stated Goal:  not stated PT Goal Formulation: With patient/family Time For Goal Achievement: 01/22/16 Potential to Achieve Goals: Good Progress towards PT goals: Progressing toward goals    Frequency  Min 4X/week    PT Plan Current plan remains appropriate    Co-evaluation              End of Session Equipment Utilized During Treatment: Gait belt Activity Tolerance: Patient tolerated treatment well Patient left: in chair;with call bell/phone within reach;with bed alarm set;with nursing/sitter in room;with family/visitor present     Time: [id number] PT Time Calculation (min) (ACUTE ONLY): 16 min  Charges:  $Therapeutic Activity: 8-22 mins                    G Codes:      Conni SlipperKirkman, Dagen Beevers 01/11/2016, 3:06 PM   Conni SlipperLaura Essie Lagunes, PT, DPT Acute Rehabilitation Services Pager: [phone]

## 2016-01-11 NOTE — Clinical Social Work Note (Signed)
Clinical Social Worker attempted to contact patient's relative, [name] and left a message to regards to discharge planning. CSW awaiting returned phone call. FL-2 completed.   CSW remains available as needed.   Derenda FennelBashira Demetrie Borge, MSW, LCSWA [phone] 01/11/2016 11:26 AM

## 2016-01-12 ENCOUNTER — Inpatient Hospital Stay (HOSPITAL_COMMUNITY)
Admission: RE | Admit: 2016-01-12 | Discharge: 2016-01-26 | DRG: 056 | Disposition: A | Discharge status: Skilled Nursing Facility | Payer: Medicare Other | Source: Intra-hospital | Attending: Physical Medicine & Rehabilitation | Admitting: Physical Medicine & Rehabilitation

## 2016-01-12 DIAGNOSIS — N183 Chronic kidney disease, stage 3 (moderate): Secondary | ICD-10-CM | POA: Diagnosis present

## 2016-01-12 DIAGNOSIS — E039 Hypothyroidism, unspecified: Secondary | ICD-10-CM | POA: Diagnosis not present

## 2016-01-12 DIAGNOSIS — J189 Pneumonia, unspecified organism: Secondary | ICD-10-CM | POA: Insufficient documentation

## 2016-01-12 DIAGNOSIS — I63422 Cerebral infarction due to embolism of left anterior cerebral artery: Secondary | ICD-10-CM

## 2016-01-12 DIAGNOSIS — G309 Alzheimer's disease, unspecified: Secondary | ICD-10-CM | POA: Diagnosis present

## 2016-01-12 DIAGNOSIS — I69391 Dysphagia following cerebral infarction: Secondary | ICD-10-CM | POA: Diagnosis not present

## 2016-01-12 DIAGNOSIS — I129 Hypertensive chronic kidney disease with stage 1 through stage 4 chronic kidney disease, or unspecified chronic kidney disease: Secondary | ICD-10-CM | POA: Diagnosis present

## 2016-01-12 DIAGNOSIS — Z90710 Acquired absence of both cervix and uterus: Secondary | ICD-10-CM | POA: Diagnosis not present

## 2016-01-12 DIAGNOSIS — M6281 Muscle weakness (generalized): Secondary | ICD-10-CM | POA: Diagnosis not present

## 2016-01-12 DIAGNOSIS — R5383 Other fatigue: Secondary | ICD-10-CM | POA: Diagnosis not present

## 2016-01-12 DIAGNOSIS — E8809 Other disorders of plasma-protein metabolism, not elsewhere classified: Secondary | ICD-10-CM | POA: Diagnosis not present

## 2016-01-12 DIAGNOSIS — E785 Hyperlipidemia, unspecified: Secondary | ICD-10-CM | POA: Diagnosis present

## 2016-01-12 DIAGNOSIS — F0280 Dementia in other diseases classified elsewhere without behavioral disturbance: Secondary | ICD-10-CM | POA: Diagnosis present

## 2016-01-12 DIAGNOSIS — N189 Chronic kidney disease, unspecified: Secondary | ICD-10-CM | POA: Diagnosis not present

## 2016-01-12 DIAGNOSIS — K219 Gastro-esophageal reflux disease without esophagitis: Secondary | ICD-10-CM | POA: Diagnosis not present

## 2016-01-12 DIAGNOSIS — R278 Other lack of coordination: Secondary | ICD-10-CM | POA: Diagnosis not present

## 2016-01-12 DIAGNOSIS — F0390 Unspecified dementia without behavioral disturbance: Secondary | ICD-10-CM | POA: Insufficient documentation

## 2016-01-12 DIAGNOSIS — M810 Age-related osteoporosis without current pathological fracture: Secondary | ICD-10-CM | POA: Diagnosis not present

## 2016-01-12 DIAGNOSIS — I63432 Cerebral infarction due to embolism of left posterior cerebral artery: Secondary | ICD-10-CM | POA: Diagnosis not present

## 2016-01-12 DIAGNOSIS — J181 Lobar pneumonia, unspecified organism: Secondary | ICD-10-CM

## 2016-01-12 DIAGNOSIS — I639 Cerebral infarction, unspecified: Secondary | ICD-10-CM | POA: Diagnosis present

## 2016-01-12 DIAGNOSIS — Z79899 Other long term (current) drug therapy: Secondary | ICD-10-CM

## 2016-01-12 DIAGNOSIS — R262 Difficulty in walking, not elsewhere classified: Secondary | ICD-10-CM | POA: Diagnosis not present

## 2016-01-12 DIAGNOSIS — I69351 Hemiplegia and hemiparesis following cerebral infarction affecting right dominant side: Secondary | ICD-10-CM | POA: Diagnosis not present

## 2016-01-12 DIAGNOSIS — IMO0002 Reserved for concepts with insufficient information to code with codable children: Secondary | ICD-10-CM

## 2016-01-12 DIAGNOSIS — Y95 Nosocomial condition: Secondary | ICD-10-CM | POA: Diagnosis present

## 2016-01-12 DIAGNOSIS — J690 Pneumonitis due to inhalation of food and vomit: Secondary | ICD-10-CM | POA: Diagnosis not present

## 2016-01-12 DIAGNOSIS — R05 Cough: Secondary | ICD-10-CM | POA: Diagnosis not present

## 2016-01-12 DIAGNOSIS — F0391 Unspecified dementia with behavioral disturbance: Secondary | ICD-10-CM | POA: Diagnosis not present

## 2016-01-12 DIAGNOSIS — R059 Cough, unspecified: Secondary | ICD-10-CM

## 2016-01-12 DIAGNOSIS — I69320 Aphasia following cerebral infarction: Secondary | ICD-10-CM | POA: Diagnosis not present

## 2016-01-12 LAB — CBC
HEMATOCRIT: 39.9 % (ref 36.0–46.0)
HEMOGLOBIN: 12.8 g/dL (ref 12.0–15.0)
MCH: 29.8 pg (ref 26.0–34.0)
MCHC: 32.1 g/dL (ref 30.0–36.0)
MCV: 93 fL (ref 78.0–100.0)
Platelets: 292 10*3/uL (ref 150–400)
RBC: 4.29 MIL/uL (ref 3.87–5.11)
RDW: 13.6 % (ref 11.5–15.5)
WBC: 9.1 10*3/uL (ref 4.0–10.5)

## 2016-01-12 LAB — CREATININE, SERUM
Creatinine, Ser: 0.91 mg/dL (ref 0.44–1.00)
GFR, EST NON AFRICAN AMERICAN: 57 mL/min — AB (ref >60)

## 2016-01-12 MED ORDER — SORBITOL 70 % SOLN: 30.0000 mL | Freq: Every day | Status: DC | PRN

## 2016-01-12 MED ORDER — AMOXICILLIN-POT CLAVULANATE 875-125 MG PO TABS: 1.0000 | ORAL_TABLET | Freq: Two times a day (BID) | ORAL | Status: DC

## 2016-01-12 MED ORDER — PANTOPRAZOLE SODIUM 40 MG PO TBEC
40.0000 mg | DELAYED_RELEASE_TABLET | Freq: Every day | ORAL | Status: DC
  Administered 2016-01-13 – 2016-01-26 (×14): 40 mg via ORAL
  Filled 2016-01-12 (×14): qty 1

## 2016-01-12 MED ORDER — SENNOSIDES-DOCUSATE SODIUM 8.6-50 MG PO TABS: 1.0000 | ORAL_TABLET | Freq: Every evening | ORAL | Status: DC | PRN

## 2016-01-12 MED ORDER — ASPIRIN 300 MG RE SUPP
300.0000 mg | Freq: Every day | RECTAL | Status: DC
  Filled 2016-01-12 (×3): qty 1

## 2016-01-12 MED ORDER — MEMANTINE HCL ER 14 MG PO CP24
28.0000 mg | ORAL_CAPSULE | Freq: Every day | ORAL | Status: DC
  Administered 2016-01-13 – 2016-01-26 (×14): 28 mg via ORAL
  Filled 2016-01-12 (×6): qty 1
  Filled 2016-01-12 (×2): qty 2
  Filled 2016-01-12: qty 1
  Filled 2016-01-12: qty 2
  Filled 2016-01-12 (×4): qty 1

## 2016-01-12 MED ORDER — RIVASTIGMINE 9.5 MG/24HR TD PT24
9.5000 mg | MEDICATED_PATCH | TRANSDERMAL | Status: DC
  Administered 2016-01-13 – 2016-01-25 (×13): 9.5 mg via TRANSDERMAL
  Filled 2016-01-12 (×16): qty 1

## 2016-01-12 MED ORDER — ATORVASTATIN CALCIUM 40 MG PO TABS
40.0000 mg | ORAL_TABLET | Freq: Every day | ORAL | Status: DC
  Administered 2016-01-13 – 2016-01-25 (×13): 40 mg via ORAL
  Filled 2016-01-12 (×13): qty 1

## 2016-01-12 MED ORDER — NAPHAZOLINE-PHENIRAMINE 0.025-0.3 % OP SOLN
1.0000 [drp] | Freq: Every day | OPHTHALMIC | Status: DC | PRN
  Filled 2016-01-12: qty 5

## 2016-01-12 MED ORDER — LEVOTHYROXINE SODIUM 25 MCG PO TABS
25.0000 ug | ORAL_TABLET | Freq: Every day | ORAL | Status: DC
  Administered 2016-01-13 – 2016-01-26 (×14): 25 ug via ORAL
  Filled 2016-01-12 (×14): qty 1

## 2016-01-12 MED ORDER — FLUTICASONE PROPIONATE 50 MCG/ACT NA SUSP
1.0000 | Freq: Every day | NASAL | Status: DC
  Administered 2016-01-13 – 2016-01-26 (×13): 1 via NASAL
  Filled 2016-01-12: qty 16

## 2016-01-12 MED ORDER — SODIUM CHLORIDE 0.9 % IV SOLN
1.5000 g | Freq: Four times a day (QID) | INTRAVENOUS | Status: DC
  Administered 2016-01-13 – 2016-01-14 (×6): 1.5 g via INTRAVENOUS
  Filled 2016-01-12 (×9): qty 1.5

## 2016-01-12 MED ORDER — ENOXAPARIN SODIUM 40 MG/0.4ML ~~LOC~~ SOLN
40.0000 mg | SUBCUTANEOUS | Status: DC
  Administered 2016-01-12 – 2016-01-25 (×13): 40 mg via SUBCUTANEOUS
  Filled 2016-01-12 (×13): qty 0.4

## 2016-01-12 MED ORDER — ACETAMINOPHEN 325 MG PO TABS
325.0000 mg | ORAL_TABLET | ORAL | Status: DC | PRN
  Administered 2016-01-17: 650 mg via ORAL
  Filled 2016-01-12: qty 2

## 2016-01-12 MED ORDER — ASPIRIN 325 MG PO TABS: 325.0000 mg | ORAL_TABLET | Freq: Every day | ORAL | Status: AC

## 2016-01-12 MED ORDER — ONDANSETRON HCL 4 MG/2ML IJ SOLN: 4.0000 mg | Freq: Four times a day (QID) | INTRAMUSCULAR | Status: DC | PRN

## 2016-01-12 MED ORDER — ASPIRIN 325 MG PO TABS
325.0000 mg | ORAL_TABLET | Freq: Every day | ORAL | Status: DC
  Administered 2016-01-13 – 2016-01-26 (×14): 325 mg via ORAL
  Filled 2016-01-12 (×14): qty 1

## 2016-01-12 MED ORDER — ONDANSETRON HCL 4 MG PO TABS: 4.0000 mg | ORAL_TABLET | Freq: Four times a day (QID) | ORAL | Status: DC | PRN

## 2016-01-12 MED ORDER — ENOXAPARIN SODIUM 40 MG/0.4ML ~~LOC~~ SOLN: 40.0000 mg | SUBCUTANEOUS | Status: DC

## 2016-01-12 MED ORDER — ATORVASTATIN CALCIUM 40 MG PO TABS: 40.0000 mg | ORAL_TABLET | Freq: Every day | ORAL | Status: DC
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
## 2016-01-12 NOTE — Progress Notes (Signed)
Report given to Ed, Charity fundraiserN. Patient will be transferred to 4W11. All belongings sent with pt. Family at [place]. No other distress noted.   Iisha Soyars, RN.

## 2016-01-12 NOTE — Care Management Important Message (Signed)
Important Message  Patient Details  Name: [name] MRN: [id number] Date of Birth: 1931-10-05   Medicare Important Message Given:  Yes    Oralia RudMegan P Ysabel Cowgill 01/12/2016, 12:31 PM

## 2016-01-12 NOTE — Interval H&P Note (Signed)
Kayla SantiagoMary C Schroeder was admitted today to Inpatient Rehabilitation with the diagnosis of left posterior frontal lobe infarct.  The patient's history has been reviewed, patient examined, and there is no change in status.  Patient continues to be appropriate for intensive inpatient rehabilitation.  I have reviewed the patient's chart and labs.  Questions were answered to the patient's satisfaction. The PAPE has been reviewed and assessment remains appropriate.  [name] 01/12/2016, 5:16 PM

## 2016-01-12 NOTE — Care Management Note (Signed)
Case Management Note  Patient Details  Name: [name] MRN: [id number] Date of Birth: Mar 19, 1931  Subjective/Objective:                    Action/Plan: Plan is to discharge patient to CIR today. No further needs per CM.   Expected Discharge Date:                  Expected Discharge Plan:  IP Rehab Facility  In-House Referral:     Discharge planning Services     Post Acute Care Choice:    Choice offered to:     DME Arranged:    DME Agency:     HH Arranged:    HH Agency:     Status of Service:  Completed, signed off  Medicare Important Message Given:  Other (see comment) (not given, per ncm) Date Medicare IM Given:    Medicare IM give by:    Date Additional Medicare IM Given:    Additional Medicare Important Message give by:     If discussed at Long Length of Stay Meetings, dates discussed:    Additional Comments:  Kermit BaloKelli F Fransico Sciandra, RN 01/12/2016, 11:41 AM
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
## 2016-01-12 NOTE — Discharge Summary (Signed)
Physician Discharge Summary  [name] VWU:[id number] DOB: Apr 23, 1931 DOA: 01/07/2016  PCP: Abigail Miyamoto, MD  Admit date: 01/07/2016 Discharge date: 01/12/2016  Time spent: 35  minutes  Recommendations for Outpatient Follow-up:  Discharge to CIR. Completes 7 days of abx on 01/16/2016   Discharge Diagnoses:  Principal Problem:   Acute embolic stroke [facility])   Active Problems:   Alzheimer's dementia   GERD (gastroesophageal reflux disease)   HTN (hypertension)   CKD (chronic kidney disease), stage III   UTI (lower urinary tract infection)   Intracranial vascular stenosis   Cerebrovascular accident (CVA) due to thrombosis of left carotid artery (HCC)   Hyperlipidemia   Thyroid activity decreased   Esophageal stricture   Leukocytosis   Aspiration pneumonia (HCC)   Dementia without behavioral disturbance   Discharge Condition: fair  Diet recommendation: heart healthy  Code status: full code  Filed Weights   01/07/16 2200  Weight: 68.901 kg (151 lb 14.4 oz)    History of present illness:  82 year old female with history of hypertension, TIA, Alzheimer's dementia, she daily stage III, GERD, hypothyroidism . Patient had a fall at home 3 days prior to admission and was taken to [facility] ED where she was diagnosed with UTI and given IV antibiotics and then discharged on oral ciprofloxacin. However symptoms did not improve. (At baseline patient communicates and is able to ambulate in and around the house and feed herself). Patient appeared to be more confused than usual and unable to get out of bed or feet herself. Family called her PCP and then brought her to [facility] ED. In the ED her vitals were stable. Blood work showed WBC of 10.3, lactate of 1.73, UA was normal, creatinine of 1.08. X-ray of the right elbow, right forearm and right humerus were negative for any fractures. Head CT showed atrophy and a new low-attenuation in the posterior left frontal lobe consistent  with subacute stroke. Admitted to hospital service for stroke workup and neurology consulted. Since symptoms had occurred at least 72 hours prior to presentation patient was out of therapeutic window for TPA.  Hospital Course:  Subacute left posterior frontal lobe infarct - residual right hemiparesis and worsening confusion. Suspect embolic stroke. MRI brain shows small-to-moderate volume of acute infarction in the high left frontal lobe predominantly involving left ACA territory. Also has moderate to severe proximal left A2 ACA stenosis, bilateral proximal PCA and severe left MCA branch vessel stenosis. Also shows a chronic pontine infarct. -Right Sided weakness slowly improving but has severe aphasia and worsening confusion due to frontal lobe involvement.  2-D echo with no source of emboli.. CT angiogram of the neck shows mild carotid artery atherosclerosis without stenosis. LDL of 121. -A1c 5.4. -Seen by speech and swallow and recommend regular diet with thin liquids.  Given her dementia and fall risk she is not a candidate for anticoagulation. Started on full dose aspirin and Lipitor. Allow permissive blood pressure.   Aspiration pneumonia Pt became more lethargic and febrile on 3/12. CXR showed LLL infiltrate. Started on zosyn, switched to unasyn this am.  patient's dementia affects her swallowing to a great extent. Past 2 days she is more alert and interactive. She has been able to swallow with good response.    GERD Continue Protonix   hypothyroidism continue Synthroid. TSH normal.  CK D stage III Renal function stable.  Alzheimer's dementia Symptoms worsened with new frontal stroke and aphasia. Slowly improving. Continue Exelon patch  Family Communication: daughter at bedside Disposition  Plan: CIR   Consultants:  Neurology  Procedures:  CT head  MRI brain/MRA head  2-D echo  Carotid Doppler  Antibiotics:  UNASYN 3/12--3/14  Augmentin  3/14-3/18    Discharge Exam: Filed Vitals:   01/12/16 0523 01/12/16 1038  BP: 136/58 86/71  Pulse: 73 69  Temp: 98.3 F (36.8 C) 98 F (36.7 C)  Resp: 18 18     General: Not in distress  HEENT:left facial droop  Chest: Clear bilaterally  Cardiovascular: Normal S1 and S2, no murmurs rub or gallop  Abdomen: , Nondistended, nontender, bowel sounds present  Musculoskeletal: Warm, no edema  CNS: Answers to a few questions and more interactive, until her name and recognizes her daughter, 3+/5 power in Right extremities  Discharge Instructions   Discharge Instructions    Ambulatory referral to Neurology    Complete by:  As directed   Follow-up with carolyn Daphine Deutscher NP at [facility] in 2 months. Thanks          Current Discharge Medication List    START taking these medications   Details  amoxicillin-clavulanate (AUGMENTIN) 875-125 MG tablet Take 1 tablet by mouth 2 (two) times daily. Qty: 10 tablet, Refills: 0    aspirin 325 MG tablet Take 1 tablet (325 mg total) by mouth daily. Qty: 30 tablet, Refills: 0    atorvastatin (LIPITOR) 40 MG tablet Take 1 tablet (40 mg total) by mouth daily at 6 PM. Qty: 30 tablet, Refills: 0    senna-docusate (SENOKOT-S) 8.6-50 MG tablet Take 1 tablet by mouth at bedtime as needed for mild constipation. Qty: 10 tablet, Refills: 0      CONTINUE these medications which have NOT CHANGED   Details  fluticasone (FLONASE) 50 MCG/ACT nasal spray Place 1 spray into both nostrils daily.    levothyroxine (SYNTHROID, LEVOTHROID) 25 MCG tablet Take 25 mcg by mouth daily before breakfast.    memantine (NAMENDA XR) 28 MG CP24 24 hr capsule Take 28 mg by mouth daily.    naphazoline-pheniramine (NAPHCON-A) 0.025-0.3 % ophthalmic solution Place 1 drop into both eyes daily as needed for irritation.    omeprazole (PRILOSEC) 40 MG capsule Take 1 capsule (40 mg total) by mouth daily. Qty: 30 capsule, Refills: 11    rivastigmine (EXELON) 9.5 mg/24hr  Place 9.5 mg onto the skin daily. Take 1 patch once a day.      STOP taking these medications     ciprofloxacin (CIPRO) 500 MG tablet      ondansetron (ZOFRAN) 4 MG tablet        No Known Allergies Follow-up Information    Follow up with Nilda Riggs, NP. Schedule an appointment as soon as possible for a visit in 2 months.   Specialty:  Family Medicine   Why:  stroke clinic   Contact information:   [address] [place] [address] [phone]       Please follow up.   Why:  MD at [facility]       The results of significant diagnostics from this hospitalization (including imaging, microbiology, ancillary and laboratory) are listed below for reference.    Significant Diagnostic Studies: Dg Elbow Complete Right  01/07/2016  CLINICAL DATA:  Pain when palpated by MD on right shoulder, elbow and forearm. Pt. Could not localize any pain. However expressed pain upon movement of this right arm. Weakness noted on entire right side of body with facial droop x 2 days. EXAM: RIGHT ELBOW - COMPLETE 3+ VIEW COMPARISON:  Humerus  same day FINDINGS: There is no evidence of fracture, dislocation, or joint effusion. There is no evidence of arthropathy or other focal bone abnormality. Soft tissues are unremarkable. IMPRESSION: Negative. Electronically Signed   By: Norva Pavlov M.D.   On: 01/07/2016 19:14   Dg Forearm Right  01/07/2016  CLINICAL DATA:  Pain EXAM: RIGHT FOREARM - 2 VIEW COMPARISON:  None. FINDINGS: There is diffuse osteopenia which limits characterization of osseous detail. No acute or suspicious osseous lesions are identified. No fracture line or displaced fracture fragment seen. Soft tissues about the right forearm are unremarkable. No significant degenerative change appreciated. IMPRESSION: Osteopenia.  No acute findings. Electronically Signed   By: Bary Richard M.D.   On: 01/07/2016 19:10   Ct Head Wo Contrast  01/07/2016  CLINICAL DATA:  Pt with onset of  AMS.Pts daughter reports right side of mouth drawn up and extremities weak. EXAM: CT HEAD WITHOUT CONTRAST TECHNIQUE: Contiguous axial images were obtained from the base of the skull through the vertex without intravenous contrast. COMPARISON:  11/22/2013 FINDINGS: There is significant central cortical atrophy. Periventricular white matter changes are consistent with small vessel disease. New low-attenuation identified within the periventricular posterior left frontal region to represent a subacute infarct. There is no associated hemorrhage or significant mass effect. IMPRESSION: 1. Atrophy and small vessel disease. 2. New low-attenuation in the posterior left frontal lobe consistent with subacute or chronic ischemic changes. No associated hemorrhage. The salient findings were discussed with SCOTT GOLDSTON on 01/07/2016 at 7:26 pm. Electronically Signed   By: Norva Pavlov M.D.   On: 01/07/2016 19:28   Ct Angio Neck W/cm &/or Wo/cm  01/09/2016  CLINICAL DATA:  Left cerebral hemispheric stroke. Right-sided hemi paresis and aphasia. EXAM: CT ANGIOGRAPHY NECK TECHNIQUE: Multidetector CT imaging of the neck was performed using the standard protocol during bolus administration of intravenous contrast. Multiplanar CT image reconstructions and MIPs were obtained to evaluate the vascular anatomy. Carotid stenosis measurements (when applicable) are obtained utilizing NASCET criteria, using the distal internal carotid diameter as the denominator. CONTRAST:  OMNIPAQUE IOHEXOL 350 MG/ML SOLN COMPARISON:  None. FINDINGS: Aortic arch: 3 vessel aortic arch with mild-to-moderate aortic plaque. Mild atheromatous plaque is present in the brachiocephalic artery without significant stenosis. There is mild narrowing of the proximal left subclavian artery due to calcified plaque. Right carotid system: Patent with mild non stenotic plaque in the proximal ICA. Left carotid system: Patent with mild non stenotic plaque in the mid  and distal common and proximal internal carotid arteries. Vertebral arteries:Vertebral arteries are patent with the left being dominant. No vertebral artery stenosis is seen. Skeleton: Mild cervical spondylosis. Other neck: Mild pleural parenchymal scarring in the lung apices. 1.1 cm short axis lymph node anterior to the proximal right mainstem bronchus is unchanged from a 02/27/2015 chest CT. IMPRESSION: 1. Mild cervical carotid artery atherosclerosis without stenosis. 2. Widely patent vertebral arteries. Electronically Signed   By: Sebastian Ache M.D.   On: 01/09/2016 08:18   Mr Brain Wo Contrast  01/08/2016  CLINICAL DATA:  Stroke.  Right hemiparesis and aphasia. EXAM: MRI HEAD WITHOUT CONTRAST MRA HEAD WITHOUT CONTRAST TECHNIQUE: Multiplanar, multiecho pulse sequences of the brain and surrounding structures were obtained without intravenous contrast. Angiographic images of the head were obtained using MRA technique without contrast. COMPARISON:  Head CT 01/07/2016 FINDINGS: MRI HEAD FINDINGS Multiple areas of restricted diffusion are seen in the left frontal lobe consistent with acute infarcts. The most confluent area is small to moderate  in size and involves the posteromedial left frontal lobe, cingulate gyrus, and body of the corpus callosum in the distal ACA distribution. Additional smaller foci of acute cortical and subcortical infarction are noted more anteriorly and laterally in the left frontal lobe, possibly ACA/ MCA watershed. There is associated cytotoxic edema without evidence of hemorrhage or mass effect. There is moderate cerebral atrophy. A small, chronic infarct is noted in the left paracentral pons. Patchy to confluent T2 hyperintensities in the subcortical and deep cerebral white matter and brainstem are compatible with moderate chronic small vessel ischemic disease. Chronic small vessel ischemic changes are also noted in the basal ganglia bilaterally. Prior bilateral cataract extraction is  noted. There are small right and trace left mastoid effusions. No significant paranasal sinus disease. Major intracranial vascular flow voids are preserved. MRA HEAD FINDINGS The visualized distal vertebral arteries are patent without stenosis. Left vertebral artery is mildly dominant. PICA, AICA, and SCA origins are patent. Basilar artery is patent without stenosis. There is a fetal type origin of the left PCA. A right posterior communicating artery is not identified. There is a severe right PCA stenosis near the P1 - P2 junction. There is also moderate to severe narrowing of the proximal left P2 segment. Moderate PCA branch vessel irregularity and narrowing are present bilaterally. Internal carotid arteries are patent from skullbase to carotid termini without stenosis. Slightly bulbous appearance of the left posterior communicating artery origin likely represents an infundibulum. MCAs are patent without evidence of significant proximal stenosis. There is asymmetric irregular left MCA branch vessel narrowing including severe focal stenoses involving distal M2 and M3 inferior division branch vessels. The A1 segments are patent without stenosis. The right A2 segment is occluded proximally. There is a moderate to severe stenosis of the proximal left A2 segment. More distal left A2 and proximal A3 segments appear heavily diseased with tandem severe stenoses and diminished flow related enhancement. The MRA does not extend superiorly to include most of the area of acute infarction. No intracranial aneurysm is identified. IMPRESSION: 1. Small to moderate volume of acute infarction in the high left frontal lobe predominantly involving the ACA territory. 2. Moderate to severe proximal left A2 ACA stenosis. Severe tandem stenoses with decreased flow more distally in the left ACA. 3. Right ACA proximal A2 occlusion. 4. Severe bilateral proximal PCA stenoses and severe left MCA branch vessel stenoses. 5. Moderate chronic small  vessel ischemic disease. 6. Chronic pontine infarct. Electronically Signed   By: Sebastian AcheAllen  Grady M.D.   On: 01/08/2016 13:05   Dg Chest Port 1 View  01/10/2016  CLINICAL DATA:  82 year old female recently victim of the stroke. Nonresponsive. Febrile. EXAM: PORTABLE CHEST 1 VIEW COMPARISON:  Chest x-ray 02/27/2015. FINDINGS: Lung volumes are low. Mild diffuse peribronchial cuffing. Consolidation in the medial aspect of the left lower lobe partially obscuring the medial left hemidiaphragm. No definite pleural effusions. No evidence of pulmonary edema. Heart size is normal. The patient is rotated to the left on today's exam, resulting in distortion of the mediastinal contours and reduced diagnostic sensitivity and specificity for mediastinal pathology. Atherosclerosis in the thoracic aorta. IMPRESSION: 1. The appearance of the chest suggests bronchitis, likely with left lower lobe pneumonia. Followup PA and lateral chest X-ray is recommended in 3-4 weeks following trial of antibiotic therapy to ensure resolution and exclude underlying malignancy. 2. Atherosclerosis. Electronically Signed   By: Trudie Reedaniel  Entrikin M.D.   On: 01/10/2016 13:31   Dg Humerus Right  01/07/2016  CLINICAL DATA:  Pain when palpated by MD on right shoulder, elbow and forearm. Pt. Could not localize any pain. However expressed pain upon movement of this right arm. Weakness noted on entire right side of body with facial droop x 2 days. EXAM: RIGHT HUMERUS - 2+ VIEW COMPARISON:  None. FINDINGS: There is no evidence of fracture or other focal bone lesions. Soft tissues are unremarkable. IMPRESSION: Negative. Electronically Signed   By: Norva Pavlov M.D.   On: 01/07/2016 19:09   Mr Maxine Glenn Head/brain Wo Cm  01/08/2016  CLINICAL DATA:  Stroke.  Right hemiparesis and aphasia. EXAM: MRI HEAD WITHOUT CONTRAST MRA HEAD WITHOUT CONTRAST TECHNIQUE: Multiplanar, multiecho pulse sequences of the brain and surrounding structures were obtained without  intravenous contrast. Angiographic images of the head were obtained using MRA technique without contrast. COMPARISON:  Head CT 01/07/2016 FINDINGS: MRI HEAD FINDINGS Multiple areas of restricted diffusion are seen in the left frontal lobe consistent with acute infarcts. The most confluent area is small to moderate in size and involves the posteromedial left frontal lobe, cingulate gyrus, and body of the corpus callosum in the distal ACA distribution. Additional smaller foci of acute cortical and subcortical infarction are noted more anteriorly and laterally in the left frontal lobe, possibly ACA/ MCA watershed. There is associated cytotoxic edema without evidence of hemorrhage or mass effect. There is moderate cerebral atrophy. A small, chronic infarct is noted in the left paracentral pons. Patchy to confluent T2 hyperintensities in the subcortical and deep cerebral white matter and brainstem are compatible with moderate chronic small vessel ischemic disease. Chronic small vessel ischemic changes are also noted in the basal ganglia bilaterally. Prior bilateral cataract extraction is noted. There are small right and trace left mastoid effusions. No significant paranasal sinus disease. Major intracranial vascular flow voids are preserved. MRA HEAD FINDINGS The visualized distal vertebral arteries are patent without stenosis. Left vertebral artery is mildly dominant. PICA, AICA, and SCA origins are patent. Basilar artery is patent without stenosis. There is a fetal type origin of the left PCA. A right posterior communicating artery is not identified. There is a severe right PCA stenosis near the P1 - P2 junction. There is also moderate to severe narrowing of the proximal left P2 segment. Moderate PCA branch vessel irregularity and narrowing are present bilaterally. Internal carotid arteries are patent from skullbase to carotid termini without stenosis. Slightly bulbous appearance of the left posterior communicating  artery origin likely represents an infundibulum. MCAs are patent without evidence of significant proximal stenosis. There is asymmetric irregular left MCA branch vessel narrowing including severe focal stenoses involving distal M2 and M3 inferior division branch vessels. The A1 segments are patent without stenosis. The right A2 segment is occluded proximally. There is a moderate to severe stenosis of the proximal left A2 segment. More distal left A2 and proximal A3 segments appear heavily diseased with tandem severe stenoses and diminished flow related enhancement. The MRA does not extend superiorly to include most of the area of acute infarction. No intracranial aneurysm is identified. IMPRESSION: 1. Small to moderate volume of acute infarction in the high left frontal lobe predominantly involving the ACA territory. 2. Moderate to severe proximal left A2 ACA stenosis. Severe tandem stenoses with decreased flow more distally in the left ACA. 3. Right ACA proximal A2 occlusion. 4. Severe bilateral proximal PCA stenoses and severe left MCA branch vessel stenoses. 5. Moderate chronic small vessel ischemic disease. 6. Chronic pontine infarct. Electronically Signed   By: Jolaine Click.D.  On: 01/08/2016 13:05    Microbiology: Recent Results (from the past 240 hour(s))  Urine culture     Status: None   Collection Time: 01/07/16  8:06 PM  Result Value Ref Range Status   Specimen Description URINE, CATHETERIZED  Final   Special Requests NONE  Final   Culture NO GROWTH 2 DAYS  Final   Report Status 01/09/2016 FINAL  Final  Culture, blood (routine x 2)     Status: None (Preliminary result)   Collection Time: 01/10/16  3:30 PM  Result Value Ref Range Status   Specimen Description BLOOD RIGHT ANTECUBITAL  Final   Special Requests BOTTLES DRAWN AEROBIC AND ANAEROBIC 10CC  Final   Culture NO GROWTH 2 DAYS  Final   Report Status PENDING  Incomplete  Culture, blood (routine x 2)     Status: None (Preliminary  result)   Collection Time: 01/10/16  3:30 PM  Result Value Ref Range Status   Specimen Description BLOOD RIGHT HAND  Final   Special Requests BOTTLES DRAWN AEROBIC AND ANAEROBIC  Final   Culture NO GROWTH 2 DAYS  Final   Report Status PENDING  Incomplete     Labs: Basic Metabolic Panel:  Recent Labs Lab 01/07/16 1824 01/10/16 1141  NA 141 140  K 4.7 3.6  CL 109 107  CO2 19* 20*  GLUCOSE 103* 121*  BUN 12 10  CREATININE 1.08* 0.90  CALCIUM 8.6* 8.3*   Liver Function Tests:  Recent Labs Lab 01/07/16 1824  AST 44*  ALT 24  ALKPHOS 82  BILITOT 1.3*  PROT 6.1*  ALBUMIN 3.2*   No results for input(s): LIPASE, AMYLASE in the last 168 hours. No results for input(s): AMMONIA in the last 168 hours. CBC:  Recent Labs Lab 01/07/16 1824 01/10/16 1141  WBC 10.3 13.2*  NEUTROABS 6.3  --   HGB 11.0* 12.7  HCT 33.8* 38.4  MCV 92.1 92.1  PLT 208 269   Cardiac Enzymes: No results for input(s): CKTOTAL, CKMB, CKMBINDEX, TROPONINI in the last 168 hours. BNP: BNP (last 3 results) No results for input(s): BNP in the last 8760 hours.  ProBNP (last 3 results) No results for input(s): PROBNP in the last 8760 hours.  CBG:  Recent Labs Lab 01/10/16 2010 01/10/16 2358 01/11/16 0403 01/11/16 0754 01/11/16 1154  GLUCAP 102* 112* 103* 101* 135*       Signed:  Eddie North MD.  [facility] 01/12/2016, 11:53 AM
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
## 2016-01-12 NOTE — Clinical Social Work Note (Signed)
Patient d/c'ing to CIR today, 3/14.  Clinical Social Worker will sign off for now as social work intervention is no longer needed. Please consult us again if new need arises.  Derenda FennelBashira Antoninette Lerner, MSW, LCSWA [phone] 01/12/2016 12:26 PM

## 2016-01-13 ENCOUNTER — Inpatient Hospital Stay (HOSPITAL_COMMUNITY): Payer: Medicare Other | Admitting: Speech Pathology

## 2016-01-13 ENCOUNTER — Inpatient Hospital Stay (HOSPITAL_COMMUNITY): Payer: Medicare Other | Admitting: Physical Therapy

## 2016-01-13 ENCOUNTER — Inpatient Hospital Stay (HOSPITAL_COMMUNITY): Payer: Medicare Other | Admitting: Occupational Therapy

## 2016-01-13 DIAGNOSIS — F0391 Unspecified dementia with behavioral disturbance: Secondary | ICD-10-CM

## 2016-01-13 LAB — CBC WITH DIFFERENTIAL/PLATELET
BASOS ABS: 0.1 10*3/uL (ref 0.0–0.1)
Basophils Relative: 1 %
EOS PCT: 5 %
Eosinophils Absolute: 0.5 10*3/uL (ref 0.0–0.7)
HCT: 36.7 % (ref 36.0–46.0)
Hemoglobin: 12.4 g/dL (ref 12.0–15.0)
LYMPHS ABS: 2.4 10*3/uL (ref 0.7–4.0)
LYMPHS PCT: 23 %
MCH: 30.8 pg (ref 26.0–34.0)
MCHC: 33.8 g/dL (ref 30.0–36.0)
MCV: 91.3 fL (ref 78.0–100.0)
MONO ABS: 1.2 10*3/uL — AB (ref 0.1–1.0)
Monocytes Relative: 12 %
Neutro Abs: 6.1 10*3/uL (ref 1.7–7.7)
Neutrophils Relative %: 59 %
PLATELETS: 285 10*3/uL (ref 150–400)
RBC: 4.02 MIL/uL (ref 3.87–5.11)
RDW: 13.3 % (ref 11.5–15.5)
WBC: 10.2 10*3/uL (ref 4.0–10.5)

## 2016-01-13 LAB — COMPREHENSIVE METABOLIC PANEL
ALT: 18 U/L (ref 14–54)
AST: 24 U/L (ref 15–41)
Albumin: 2.6 g/dL — ABNORMAL LOW (ref 3.5–5.0)
Alkaline Phosphatase: 62 U/L (ref 38–126)
Anion gap: 10 (ref 5–15)
BUN: 14 mg/dL (ref 6–20)
CHLORIDE: 111 mmol/L (ref 101–111)
CO2: 22 mmol/L (ref 22–32)
Calcium: 8.2 mg/dL — ABNORMAL LOW (ref 8.9–10.3)
Creatinine, Ser: 0.9 mg/dL (ref 0.44–1.00)
GFR, EST NON AFRICAN AMERICAN: 58 mL/min — AB (ref >60)
Glucose, Bld: 93 mg/dL (ref 65–99)
POTASSIUM: 3.6 mmol/L (ref 3.5–5.1)
Sodium: 143 mmol/L (ref 135–145)
Total Bilirubin: 0.6 mg/dL (ref 0.3–1.2)
Total Protein: 6.1 g/dL — ABNORMAL LOW (ref 6.5–8.1)

## 2016-01-13 NOTE — Progress Notes (Signed)
82 y.o. right handed female with history of hypertension, Alzheimer's disease, hyperlipidemia, esophageal stricture status post dilatation chronic renal insufficiency baseline creatinine 1.08. History taken from chart review and family. Presented 01/07/2016 with acute onset of right-sided weakness. By report patient recently was taken to [facility] 2 days prior diagnosis of urinary tract infection placed on Cipro. Patient lives With daughter and who assists patient as needed, but has a daughter who does assist with homemaking and meal preparation. She has a personal care attendant 3 times a week. She was independent with mobility without assistive device but assistance for safety was provided. Marland Kitchen MRI of the brain showed small to moderate volume of acute infarct high left frontal lobe predominantly involving the ACA territory. MRA with moderate to severe proximal left A2 ACA stenosis. Right ACA proximal A2 occlusion. Echocardiogram with ejection fraction of 22% grade 1 diastolic dysfunction. Patient did not receive TPA. Neurology consulted presently on aspirin for CVA prophylaxis  Subjective/Complaints: Confused, poor eye contact daughter and son in law in room. Pt oriented to self.  Did not require 24/7 sup at home Discussed that pt will have increased care needs at home post d/c ROS- cannot obtain due to Mental status Objective: Vital Signs: Blood pressure 130/60, pulse 78, temperature 98.3 F (36.8 C), temperature source Oral, resp. rate 18, SpO2 95 %. No results found. Results for orders placed or performed during the hospital encounter of 01/12/16 (from the past 72 hour(s))  CBC     Status: None   Collection Time: 01/12/16  6:08 PM  Result Value Ref Range   WBC 9.1 4.0 - 10.5 K/uL   RBC 4.29 3.87 - 5.11 MIL/uL   Hemoglobin 12.8 12.0 - 15.0 g/dL   HCT 39.9 36.0 - 46.0 %   MCV 93.0 78.0 - 100.0 fL   MCH 29.8 26.0 - 34.0 pg   MCHC 32.1 30.0 - 36.0 g/dL   RDW 13.6 11.5 - 15.5 %   Platelets 292 150 - 400 K/uL  Creatinine, serum     Status: Abnormal   Collection Time: 01/12/16  6:08 PM  Result Value Ref Range   Creatinine, Ser 0.91 0.44 - 1.00 mg/dL   GFR calc non Af Amer 57 (L) >60 mL/min   GFR calc Af Amer >60 >60 mL/min    Comment: (NOTE) The eGFR has been calculated using the CKD EPI equation. This calculation has not been validated in all clinical situations. eGFR's persistently <60 mL/min signify possible Chronic Kidney Disease.   CBC WITH DIFFERENTIAL     Status: Abnormal   Collection Time: 01/13/16  6:37 AM  Result Value Ref Range   WBC 10.2 4.0 - 10.5 K/uL   RBC 4.02 3.87 - 5.11 MIL/uL   Hemoglobin 12.4 12.0 - 15.0 g/dL   HCT 36.7 36.0 - 46.0 %   MCV 91.3 78.0 - 100.0 fL   MCH 30.8 26.0 - 34.0 pg   MCHC 33.8 30.0 - 36.0 g/dL   RDW 13.3 11.5 - 15.5 %   Platelets 285 150 - 400 K/uL   Neutrophils Relative % 59 %   Neutro Abs 6.1 1.7 - 7.7 K/uL   Lymphocytes Relative 23 %   Lymphs Abs 2.4 0.7 - 4.0 K/uL   Monocytes Relative 12 %   Monocytes Absolute 1.2 (H) 0.1 - 1.0 K/uL   Eosinophils Relative 5 %   Eosinophils Absolute 0.5 0.0 - 0.7 K/uL   Basophils Relative 1 %   Basophils Absolute 0.1 0.0 - 0.1  K/uL  Comprehensive metabolic panel     Status: Abnormal   Collection Time: 01/13/16  6:37 AM  Result Value Ref Range   Sodium 143 135 - 145 mmol/L   Potassium 3.6 3.5 - 5.1 mmol/L   Chloride 111 101 - 111 mmol/L   CO2 22 22 - 32 mmol/L   Glucose, Bld 93 65 - 99 mg/dL   BUN 14 6 - 20 mg/dL   Creatinine, Ser 0.90 0.44 - 1.00 mg/dL   Calcium 8.2 (L) 8.9 - 10.3 mg/dL   Total Protein 6.1 (L) 6.5 - 8.1 g/dL   Albumin 2.6 (L) 3.5 - 5.0 g/dL   AST 24 15 - 41 U/L   ALT 18 14 - 54 U/L   Alkaline Phosphatase 62 38 - 126 U/L   Total Bilirubin 0.6 0.3 - 1.2 mg/dL   GFR calc non Af Amer 58 (L) >60 mL/min   GFR calc Af Amer >60 >60 mL/min    Comment: (NOTE) The eGFR has been calculated using the CKD EPI equation. This calculation has not been validated  in all clinical situations. eGFR's persistently <60 mL/min signify possible Chronic Kidney Disease.    Anion gap 10 5 - 15     General: No acute distress Mood and affect are appropriate Heart: Regular rate and rhythm no rubs murmurs or extra sounds Lungs: Clear to auscultation, breathing unlabored, no rales or wheezes Abdomen: Positive bowel sounds, soft nontender to palpation, nondistended Extremities: No clubbing, cyanosis, or edema Skin: No evidence of breakdown, no evidence of rash Neurologic: Cranial nerves II through XII intact, motor strength is difficult to assess due to apraxia and decreased abilitySensory exam normal sensation to light touch and proprioception in bilateral upper and lower extremities Cerebellar exam normal finger to nose to finger as well as heel to shin in bilateral upper and lower extremities Musculoskeletal: Full range of motion in all 4 extremities. No joint swelling   Assessment/Plan: 1. Functional deficits secondary to Right hemiparesis due to Left post frontal infarct which require 3+ hours per day of interdisciplinary therapy in a comprehensive inpatient rehab setting. Physiatrist is providing close team supervision and 24 hour management of active medical problems listed below. Physiatrist and rehab team continue to assess barriers to discharge/monitor patient progress toward functional and medical goals. FIM:       Function - Toileting Toileting steps completed by helper: Adjust clothing prior to toileting, Performs perineal hygiene, Adjust clothing after toileting           Function - Comprehension Comprehension: Auditory Comprehension assist level: Understands basic less than 25% of the time/ requires cueing >75% of the time  Function - Expression Expression assist level: Expresses basic 25 - 49% of the time/requires cueing 50 - 75% of the time. Uses single words/gestures.  Function - Social Interaction Social Interaction assist  level: Interacts appropriately 25 - 49% of time - Needs frequent redirection.  Function - Problem Solving Problem solving assist level: Solves basic 25 - 49% of the time - needs direction more than half the time to initiate, plan or complete simple activities  Function - Memory Memory assist level: Recognizes or recalls less than 25% of the time/requires cueing greater than 75% of the time  Medical Problem List and Plan: 1.  Right sided weakness secondary to left posterior frontal lobe infarct 2.  DVT Prophylaxis/Anticoagulation: Subcutaneous Lovenox. Monitor platelet counts and any signs of bleeding 3. Pain Management: Tylenol as needed 4. Mood/dementia: Namenda 28 mg daily,Exelon patch,  moderate dementia with acute worsening of cognition due to CVA 5. Neuropsych: This patient is not capable of making decisions on her own behalf. 6. Skin/Wound Care: Routine skin checks 7. Fluids/Electrolytes/Nutrition: Routine I&O with follow-up chemistries 8. Left lower lobe pneumonia. Unasyn 01/11/2016 9. Hypothyroidism. Synthroid. TSH 4.258 10. Chronic renal insufficiency. Baseline creatinine 1.08. Follow-up chemistries stable/normal 11. Hyperlipidemia. Lipitor 12.  Hypoalbuminemia - prostat  LOS (Days) 1 A FACE TO FACE EVALUATION WAS PERFORMED  Cambell Rickenbach E 01/13/2016, 10:34 AM

## 2016-01-13 NOTE — Evaluation (Signed)
Speech Language Pathology Assessment and Plan  Patient Details  Name: [name] MRN: [id number] Date of Birth: [birth year]  SLP Diagnosis: Aphasia;Cognitive Impairments  Rehab Potential: Fair ELOS: 14 days     Today's Date: 01/13/2016 SLP Individual Time: 3295-1884 SLP Individual Time Calculation (min): 60 min   Problem List:  Patient Active Problem List   Diagnosis Date Noted  . Aspiration pneumonia ([place]) 01/12/2016  . Dementia without behavioral disturbance 01/12/2016  . CVA (cerebral infarction) 01/12/2016  . Dementia   . Left lower lobe pneumonia   . Chronic renal insufficiency   . Cerebrovascular accident (CVA) due to thrombosis of left carotid artery ([place])   . Hyperlipidemia   . Thyroid activity decreased   . Esophageal stricture   . Leukocytosis   . Intracranial vascular stenosis   . Acute embolic stroke ([place])   . UTI (lower urinary tract infection) 01/07/2016  . Alzheimer's dementia 03/26/2015  . GERD (gastroesophageal reflux disease) 03/26/2015  . HTN (hypertension) 03/26/2015  . CKD (chronic kidney disease), stage III 03/26/2015  . S/P cholecystectomy 03/26/2015   Past Medical History:  Past Medical History  Diagnosis Date  . Hypertension   . Senile-onset Alzheimer's dementia with behavioral disturbance   . Verrucous keratosis   . Complete tear of right rotator cuff   . Hyperlipidemia   . Hypothyroidism   . Osteoporosis   . Chronic kidney disease, stage 3   . Keratoconjunctivitis due to Adenovirus   . GERD without esophagitis   . Noninfective gastroenteritis and colitis   . Arthritis   . Infectious colitis   . Diverticulosis   . Status post dilation of esophageal narrowing    Past Surgical History:  Past Surgical History  Procedure Laterality Date  . Cholecystectomy    . Abdominal hysterectomy    . Breast surgery Bilateral     Assessment / Plan / Recommendation Clinical Impression  [name] is a 82 y.o. right handed female with history  of Alzheimer's disease who presented 01/07/2016 with acute onset of right-sided weakness. MRI of the brain showed small to moderate volume of acute infarct high left frontal lobe predominantly involving the ACA territory. Patient was admitted for comprehensive rehabilitation program on 01/12/2016.  SLP evaluation completed on 01/13/2016 with the following results: Pt presents with a global aphasia complicated by underlying baseline dementia.  Pt is not currently able to consistently follow 1-step commands or answer basic yes/no questions.  She was noted with only intermittent spontaneous verbalizations at the word level which were characterized by echolalia and perseveration.   Pt also demonstrates significant cognitive deficits which are characterized by decreased sustained attention to task, poor task initiation and sequencing, as well as decreased storage and retrieval of basic information.   SLP also administered a bedside swallow evaluation due to reports of fluctuating alertness impacting ability to safely consume POs.  Pt demonstrated efficient mastication of solids and demonstrated no overt s/s of aspiration with solids or liquids.  Pt remains afebrile and O2 sats remain WFL on room air.  Recommend that pt remain on regular textures and thin liquids.  Pt would benefit from full supervision during meals due to the abovementioned cognitive-linguistic impairments but no further ST follow up warranted for dysphagia.   Pt has experienced a loss in function due to CVA and would benefit from skilled ST while inpatient in order to maximize functional independence and reduce burden of care prior to discharge.  Anticipate that pt will need 24/7 supervision at  discharge in addition to ST follow up at next level of care.    Skilled Therapeutic Interventions          Cognitive-linguistic and bedside swallow evaluation completed with results and recommendations reviewed with family.  Modest improvements for expansion  of appropriate verbal utterances were elicited during singing tasks with a basic familiar song and max assist sentence completion cues.  Pt was able to identify object when named from a field of three for ~50% accuracy with max assist verbal cues.  Max assist multimodal cues were also needed for initiation and sequencing of self feeding of a simple snack during today's bedside swallow evaluation.  Pt unable to return demonstration of use of call bell despite max assist verbal and visual cues.  Nurse tech made aware of recommendation for full supervision during meals.  Pt left with RN and nurse tech present to transfer back to bed.      SLP Assessment  Patient will need skilled [facility] during CIR admission    Recommendations  Patient destination: Home Follow up Recommendations: 24 hour supervision/assistance;Home Health SLP    SLP Frequency 3 to 5 out of 7 days   SLP Duration  SLP Intensity  SLP Treatment/Interventions 14 days   Minumum of 1-2 x/day, 30 to 90 minutes  Cognitive remediation/compensation;Cueing hierarchy;Environmental controls;Functional tasks;Patient/family education;Internal/external aids    Pain Pain Assessment Pain Assessment: No/denies pain  Prior Functioning Cognitive/Linguistic Baseline: Baseline deficits Baseline deficit details: history of dementia Type of Home: House  Lives With: Alone;Other (Comment) (daughter lives next door and checks on pt frequently throughout the day, also has paid caregiver ) Available Help at Discharge: Family;Friend(s) Vocation: Retired  Function:  Eating Eating                 Cognition Comprehension Comprehension assist level: Understands basic 25 - 49% of the time/ requires cueing 50 - 75% of the time  Expression   Expression assist level: Expresses basis less than 25% of the time/requires cueing >75% of the time.  Social Interaction Social Interaction assist level: Interacts appropriately  25 - 49% of time - Needs frequent redirection.  Problem Solving Problem solving assist level: Solves basic less than 25% of the time - needs direction nearly all the time or does not effectively solve problems and may need a restraint for safety  Memory Memory assist level: Recognizes or recalls less than 25% of the time/requires cueing greater than 75% of the time   Short Term Goals: Week 1: SLP Short Term Goal 1 (Week 1): Pt will follow 1 step commands during basic, familiar tasks with max assist multimodal cues.   SLP Short Term Goal 2 (Week 1): Pt will answer basic, biographical yes/no questions for >50% accuracy with max assist multimodal cues.  SLP Short Term Goal 3 (Week 1): Pt will sustain her attention to a basic familiar task for ~3 minute intervals with max assist verbal and tactile cues for redirection.   SLP Short Term Goal 4 (Week 1): Pt will complete a basic, familiar task with max assist verbal and tactile cues for initiation and sequencing.   SLP Short Term Goal 5 (Week 1): Pt will utilize call bell to convey needs/wants to staff members with max assist verbal and tactile cues.    Refer to Care Plan for Long Term Goals  Recommendations for other services: None  Discharge Criteria: Patient will be discharged from SLP if patient refuses treatment 3 consecutive times without medical reason,  if treatment goals not met, if there is a change in medical status, if patient makes no progress towards goals or if patient is discharged from hospital.  The above assessment, treatment plan, treatment alternatives and goals were discussed and mutually agreed upon: by family  Emilio Math 01/13/2016, 4:11 PM

## 2016-01-13 NOTE — Care Management (Signed)
[facility]patient [facility] Individual Statement of Services  Patient Name:  [name]  Date:  01/13/2016  Welcome to the [facility].  Our goal is to provide you with an individualized program based on your diagnosis and situation, designed to meet your specific needs.  With this comprehensive rehabilitation program, you will be expected to participate in at least 3 hours of rehabilitation therapies Monday-Friday, with modified therapy programming on the weekends.  Your rehabilitation program will include the following services:  Physical Therapy (PT), Occupational Therapy (OT), Speech Therapy (ST), 24 hour per day rehabilitation nursing, Therapeutic Recreaction (TR), Case Management (Social Worker), Rehabilitation Medicine, Nutrition Services and Pharmacy Services  Weekly team conferences will be held on Wednesday to discuss your progress.  Your Social Worker will talk with you frequently to get your input and to update you on team discussions.  Team conferences with you and your family in attendance may also be held.  Expected length of stay: 14-18 days Overall anticipated outcome: min assist level  Depending on your progress and recovery, your program may change. Your Social Worker will coordinate services and will keep you informed of any changes. Your Social Worker's name and contact numbers are listed  below.  The following services may also be recommended but are not provided by the Inpatient [facility]:    Home Health Rehabiltiation Services  Outpatient Rehabilitation Services    Arrangements will be made to provide these services after discharge if needed.  Arrangements include referral to agencies that provide these services.  Your insurance has been verified to be:  UHC-Medicare Your primary doctor is:  Brent BullaLawrence Perry  Pertinent information will be shared with your doctor and your insurance company.  Social Worker:  Dossie DerBecky Dorothye Berni, SW  [phone] or ([phone]) [phone]  Information discussed with and copy given to patient by: Lucy Chrisupree, Evelyna Folker G, 01/13/2016, 9:58 AM
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
## 2016-01-13 NOTE — Progress Notes (Signed)
Physical Therapy Assessment and Plan  Patient Details  Name: [name] MRN: [id number] Date of Birth: 1931/03/25  PT Diagnosis: Abnormal posture, Abnormality of gait, Ataxia, Ataxic gait, Coordination disorder, Difficulty walking, Hemiplegia dominant, Impaired cognition, Impaired sensation and Muscle weakness Rehab Potential: Good ELOS: 14-18 days    Today's Date: 01/13/2016 PT Individual Time: 9702-6378 PT Individual Time Calculation (min): 91 min    Problem List:  Patient Active Problem List   Diagnosis Date Noted  . Aspiration pneumonia ([place]) 01/12/2016  . Dementia without behavioral disturbance 01/12/2016  . CVA (cerebral infarction) 01/12/2016  . Dementia   . Left lower lobe pneumonia   . Chronic renal insufficiency   . Cerebrovascular accident (CVA) due to thrombosis of left carotid artery ([place])   . Hyperlipidemia   . Thyroid activity decreased   . Esophageal stricture   . Leukocytosis   . Intracranial vascular stenosis   . Acute embolic stroke ([place])   . UTI (lower urinary tract infection) 01/07/2016  . Alzheimer's dementia 03/26/2015  . GERD (gastroesophageal reflux disease) 03/26/2015  . HTN (hypertension) 03/26/2015  . CKD (chronic kidney disease), stage III 03/26/2015  . S/P cholecystectomy 03/26/2015    Past Medical History:  Past Medical History  Diagnosis Date  . Hypertension   . Senile-onset Alzheimer's dementia with behavioral disturbance   . Verrucous keratosis   . Complete tear of right rotator cuff   . Hyperlipidemia   . Hypothyroidism   . Osteoporosis   . Chronic kidney disease, stage 3   . Keratoconjunctivitis due to Adenovirus   . GERD without esophagitis   . Noninfective gastroenteritis and colitis   . Arthritis   . Infectious colitis   . Diverticulosis   . Status post dilation of esophageal narrowing    Past Surgical History:  Past Surgical History  Procedure Laterality Date  . Cholecystectomy    . Abdominal hysterectomy    .  Breast surgery Bilateral     Assessment & Plan Clinical Impression: Patient is a16 y.o. right handed female with history of hypertension, Alzheimer's disease, hyperlipidemia, esophageal stricture status post dilatation chronic renal insufficiency baseline creatinine 1.08. History taken from chart review and family. Presented 01/07/2016 with acute onset of right-sided weakness. By report patient recently was taken to [facility] 2 days prior diagnosis of urinary tract infection placed on Cipro. Patient lives With daughter and who assists patient as needed, but has a daughter who does assist with homemaking and meal preparation. She has a personal care attendant 3 times a week. She was independent with mobility without assistive device but assistance for safety was provided. Mar[facility] Kitchen MRI of the brain showed small to moderate volume of acute infarct high left frontal lobe predominantly involving the ACA territory. MRA with moderate to severe proximal left A2 ACA stenosis. Right ACA proximal A2 occlusion. Echocardiogram with ejection fraction of 58% grade 1 diastolic dysfunction. Patient did not receive TPA. Neurology consulted presently on aspirin for CVA prophylaxis. Subcutaneous Lovenox for DVT prophylaxis. A chest x-ray 01/10/2016 suggestive of bronchitis, likely with left lower lobe pneumonia.  Patient transferred to [facility] on 01/12/2016 .   Patient currently requires mod with mobility secondary to muscle weakness and muscle paralysis, impaired timing and sequencing, unbalanced muscle activation, motor apraxia, ataxia, decreased coordination and decreased motor planning, decreased initiation, decreased attention, decreased awareness, decreased problem solving, decreased safety awareness and delayed processing and decreased standing balance, decreased postural control, hemiplegia and decreased balance strategies.  Prior to hospitalization, patient was modified  independent  with mobility and lived with Alone, Other  (Comment) (Duaghter lives next door. ) in a House home.  Home access is 1Stairs to enter.  Patient will benefit from skilled PT intervention to maximize safe functional mobility, minimize fall risk and decrease caregiver burden for planned discharge home with 24 hour assist.  Anticipate patient will benefit from follow up [facility] at discharge.  PT - End of Session Activity Tolerance: Tolerates 30+ min activity with multiple rests Endurance Deficit: Yes Endurance Deficit Description: multiple rest breaks needed.  PT Assessment Rehab Potential (ACUTE/IP ONLY): Good Barriers to Discharge: Decreased caregiver support (Daughter lives next door, may be able to move in if needed) PT Patient demonstrates impairments in the following area(s): Balance;Endurance;Motor;Safety;Behavior;Sensory PT Transfers Functional Problem(s): Bed Mobility;Bed to Chair;Furniture;Car;Floor PT Locomotion Functional Problem(s): Ambulation;Wheelchair Mobility;Stairs PT Plan PT Intensity: Minimum of 1-2 x/day ,45 to 90 minutes PT Frequency: 5 out of 7 days PT Duration Estimated Length of Stay: 14-18 days  PT Treatment/Interventions: Ambulation/gait training;Balance/vestibular training;Cognitive remediation/compensation;Community reintegration;Discharge planning;Disease management/prevention;DME/adaptive equipment instruction;Functional mobility training;Neuromuscular re-education;Pain management;Patient/family education;Psychosocial support;Stair training;Therapeutic Activities;Therapeutic Exercise;UE/LE Strength taining/ROM;UE/LE Coordination activities;Visual/perceptual remediation/compensation;Wheelchair propulsion/positioning PT Transfers Anticipated Outcome(s): Min A  PT Locomotion Anticipated Outcome(s): Min A with LRAD.  PT Recommendation Follow Up Recommendations: Home health PT Patient destination: Home Equipment Recommended: Rolling walker with 5" wheels;Wheelchair (measurements);Tub/shower bench;To be  determined  Skilled Therapeutic Intervention  Pt received sitting in WC with hand off from OT. Patient in no signs of Pain. Pt's daughter present throughout Evaluation for history and home environment assessment. PT performed Evaluation initiated treatment intervention; see below for results. Patient instructed in Car transfer with Mod A and RW with constant cues for proper technique and sequencing. Patient also instructed in gait training for 1f, 117f and 2064fith RW, mod A to prevent posterior lean and mod A with AD management. Gait for 10 ft over uneven surface, Mod A and constant cueing for improved step pattern. [facility] and quick release belt in place. All other needs met.   PT Evaluation Precautions/Restrictions Precautions Precautions: Fall Precaution Comments: hx of dementia, expressive delays, right in-attention and slight weakness Restrictions Weight Bearing Restrictions: No General Chart Reviewed: Yes Additional Pertinent History: Dementia  Family/Caregiver Present: Yes Vital Signs  Pain Pain Assessment Faces Pain Scale: No hurt Home Living/Prior Functioning Home Living Available Help at Discharge: Family;Friend(s) Type of Home: House Home Access: Stairs to enter [employer] Steps: 1 Entrance Stairs-Rails: None Home Layout: One level Bathroom Shower/Tub: Tub/shower unit;Other (comment) (Shower chair and hand held spray nozzle ) Bathroom Toilet: Standard (Raised toilet seat over standard toilet ) Bathroom Accessibility: Yes (WC can get in, but difficulty )  Lives With: Alone;Other (Comment) (Duaghter lives next door. ) Prior Function Level of Independence: Independent with basic ADLs;Needs assistance with ADLs;Needs assistance with homemaking  Able to Take Stairs?: Yes Driving: No Vocation: Retired VisRadiographer, therapeuticHistory Baseline Vision: Other (comment) Visual History:  (R side  neglect. Can attain through cueing. ) Vision - Assessment Additional Comments: Pt able to scan to the right and left accurately but does not follow one step commands consistently.  Will look at vision further in functional context as well as question family about her PLOF.  Perception Perception: Impaired Inattention/Neglect: Does not attend to right side of body Praxis Praxis: Impaired Praxis Impairment Details: Initiation;Motor planning Praxis-Other Comments: Pt with decreased ability to move the RLE to command however, she would lift the  left spontaneously when therapist was helping her to donn her sock or pants.  Will continue to further assess in function.    Cognition Overall Cognitive Status: Impaired/Different from baseline Arousal/Alertness: Awake/alert Orientation Level: Oriented to person;Disoriented to place;Disoriented to time;Disoriented to situation Attention: Divided Sustained Attention: Impaired Sustained Attention Impairment: Verbal basic Divided Attention: Impaired Memory: Impaired Memory Impairment: Retrieval deficit Awareness: Impaired Awareness Impairment: Intellectual impairment;Anticipatory impairment Safety/Judgment: Impaired Comments: Patient non verbal with the execption of occastional Yes/no.  Sensation Sensation Light Touch: Impaired by gross assessment Additional Comments: testing difficult due to non-verbal and R neglect.  Coordination Gross Motor Movements are Fluid and Coordinated: No (R sided impairment. ) Motor  Motor Motor: Hemiplegia;Motor apraxia Motor - Skilled Clinical Observations: decreased motor planning on the R UE and LE. poor initiation and decreased movement amplitute  Mobility Bed Mobility Bed Mobility: Rolling Right;Rolling Left;Supine to Sit;Sit to Supine Rolling Right: 2: Max assist Rolling Right Details: Manual facilitation for placement;Manual facilitation for weight bearing;Verbal cues for sequencing;Verbal cues for  technique;Visual cues/gestures for precautions/safety;Tactile cues for placement;Tactile cues for weight shifting;Tactile cues for initiation;Tactile cues for sequencing Rolling Left: 2: Max assist Rolling Left Details: Tactile cues for initiation;Tactile cues for sequencing;Tactile cues for placement;Visual cues/gestures for precautions/safety;Visual cues/gestures for sequencing;Verbal cues for sequencing;Verbal cues for technique;Manual facilitation for weight shifting;Manual facilitation for placement Supine to Sit: 3: Mod assist Supine to Sit Details: Manual facilitation for placement;Manual facilitation for weight shifting;Verbal cues for technique;Verbal cues for sequencing;Visual cues/gestures for precautions/safety;Visual cues/gestures for sequencing;Tactile cues for initiation;Tactile cues for sequencing;Tactile cues for weight shifting Sit to Supine: 2: Max assist Sit to Supine - Details: Tactile cues for initiation;Tactile cues for sequencing;Tactile cues for weight shifting;Tactile cues for placement;Visual cues/gestures for sequencing;Verbal cues for sequencing;Verbal cues for technique;Manual facilitation for weight shifting;Manual facilitation for placement Transfers Transfers: Yes Sit to Stand: 3: Mod assist Sit to Stand Details: Manual facilitation for weight shifting;Manual facilitation for placement;Verbal cues for sequencing;Verbal cues for technique;Tactile cues for weight shifting;Tactile cues for sequencing;Tactile cues for initiation;Tactile cues for placement;Visual cues/gestures for precautions/safety Stand Pivot Transfers: 3: Mod assist;With armrests;Other (comment) (RW) Stand Pivot Transfer Details: Tactile cues for initiation;Tactile cues for sequencing;Tactile cues for weight shifting;Tactile cues for placement;Tactile cues for posture;Visual cues/gestures for precautions/safety;Visual cues/gestures for sequencing;Verbal cues for sequencing;Verbal cues for technique;Manual  facilitation for weight shifting;Manual facilitation for placement Stand Pivot Transfer Details (indicate cue type and reason): Pt demonastrated heavy posterior lean and decreased motor planning.   Locomotion  Ambulation Ambulation: Yes Ambulation/Gait Assistance: 3: Mod assist Ambulation Distance (Feet): 20 Feet Assistive device: Rolling walker Ambulation/Gait Assistance Details: Tactile cues for initiation;Tactile cues for sequencing;Tactile cues for weight shifting;Tactile cues for placement;Visual cues/gestures for precautions/safety;Visual cues/gestures for sequencing;Verbal cues for technique;Verbal cues for sequencing;Verbal cues for precautions/safety;Verbal cues for safe use of DME/AE;Manual facilitation for weight shifting;Manual facilitation for placement Ambulation/Gait Assistance Details: Heavy posterior lean, decreaesd attention to R UE.  Gait Gait: Yes Gait Pattern: Impaired Gait Pattern: Decreased stance time - right;Decreased step length - left;Decreased step length - right;Decreased weight shift to left;Right foot flat;Right steppage Stairs / Additional Locomotion Stairs: Yes Stairs Assistance: 3: Mod assist;2: Max assist Stairs Assistance Details: Tactile cues for initiation;Tactile cues for sequencing;Tactile cues for weight shifting;Tactile cues for placement;Visual cues for safe use of DME/AE;Visual cues/gestures for precautions/safety;Visual cues/gestures for sequencing;Verbal cues for sequencing;Verbal cues for technique;Verbal cues for precautions/safety;Verbal cues for gait pattern;Verbal cues for safe use of DME/AE;Manual facilitation for weight shifting Stairs Assistance Details (indicate cue type and  reason): Max verbal and tactle cues for proper UE placement and technique  Stair Management Technique: One rail Left;Other (comment) (R UE HHA. ) Number of Stairs: 4 Height of Stairs: 6 Ramp: 3: Mod Administrator Mobility: Yes Wheelchair  Assistance: 2: Max Technical sales engineer Details: Tactile cues for initiation;Tactile cues for sequencing;Tactile cues for placement;Visual cues for safe use of DME/AE;Visual cues/gestures for sequencing;Verbal cues for sequencing;Verbal cues for technique;Verbal cues for safe use of DME/AE;Manual facilitation for placement Wheelchair Propulsion: Left lower extremity;Left upper extremity Wheelchair Parts Management: Needs assistance Distance: 5  Trunk/Postural Assessment  Cervical Assessment Cervical Assessment: Within Functional Limits Thoracic Assessment Thoracic Assessment: Within Functional Limits Lumbar Assessment Lumbar Assessment: Within Functional Limits Postural Control Postural Control: Deficits on evaluation Righting Reactions: decreased in stance, and sitting.  Protective Responses: Delayed   Balance Balance Balance Assessed: Yes Static Sitting Balance Static Sitting - Comment/# of Minutes: [place] able to maintin sitting EOB without UE support.  Dynamic Sitting Balance Sitting balance - Comments: UE suport needed when reaching for objects outside BOS Static Standing Balance Static Standing - Balance Support: Bilateral upper extremity supported Static Standing - Comment/# of Minutes: Pat[place] unable to maintian balance for >3 seconds without Min-Mod A from PT  Extremity Assessment      RLE Assessment RLE Assessment: Exceptions to [facility] RLE AROM (degrees) RLE Overall AROM Comments: Patient unable to demonstrate full AROM due to decreased processing.  RLE Strength RLE Overall Strength Comments: at least 4/5 through funcitonal assessment. Unable to formally test due to cognitive impairments.  LLE Assessment LLE Assessment: Within Functional Limits   See Function Navigator for Current Functional Status.   Refer to Care Plan for Long Term Goals  Recommendations for other services: None  Discharge Criteria: Patient will be discharged from PT if patient refuses  treatment 3 consecutive times without medical reason, if treatment goals not met, if there is a change in medical status, if patient makes no progress towards goals or if patient is discharged from hospital.  The above assessment, treatment plan, treatment alternatives and goals were discussed and mutually agreed upon: by patient and by family  Lorie Phenix 01/13/2016, 12:42 PM

## 2016-01-13 NOTE — Progress Notes (Signed)
Patient information reviewed and entered into eRehab system by Ayodele Sangalang, RN, CRRN, PPS Coordinator.  Information including medical coding and functional independence measure will be reviewed and updated through discharge.     Per nursing patient was given "Data Collection Information Summary for Patients in Inpatient Rehabilitation Facilities with attached "Privacy Act Statement-Health Care Records" upon admission.  

## 2016-01-13 NOTE — Progress Notes (Signed)
Nutrition Brief Note  Patient identified on the Malnutrition Screening Tool (MST) Report  Wt Readings from Last 15 Encounters:  01/07/16 151 lb 14.4 oz (68.901 kg)  03/26/15 155 lb 4 oz (70.421 kg)  Weight stable.   Current diet order is regular, patient is consuming approximately 100% of meals at this time. Appetite reported fine currently and PTA with no other difficulties. Labs and medications reviewed.   No nutrition interventions warranted at this time. If nutrition issues arise, please consult RD.   Roslyn SmilingStephanie Elmar [place], MS, RD, LDN Pager # [phone] After hours/ weekend pager # [phone]

## 2016-01-13 NOTE — Evaluation (Signed)
Occupational Therapy Assessment and Plan  Patient Details  Name: [name] MRN: [id number] Date of Birth: Dec 18, 1930  OT Diagnosis: abnormal posture, altered mental status, apraxia, cognitive deficits, hemiplegia affecting dominant side and muscle weakness (generalized) Rehab Potential: Rehab Potential (ACUTE ONLY): Good ELOS: 12-14 days   Today's Date: 01/13/2016 OT Individual Time: 3536-1443 OT Individual Time Calculation (min): 64 min     Problem List:  Patient Active Problem List   Diagnosis Date Noted  . Aspiration pneumonia ([place]) 01/12/2016  . Dementia without behavioral disturbance 01/12/2016  . CVA (cerebral infarction) 01/12/2016  . Dementia   . Left lower lobe pneumonia   . Chronic renal insufficiency   . Cerebrovascular accident (CVA) due to thrombosis of left carotid artery ([place])   . Hyperlipidemia   . Thyroid activity decreased   . Esophageal stricture   . Leukocytosis   . Intracranial vascular stenosis   . Acute embolic stroke ([place])   . UTI (lower urinary tract infection) 01/07/2016  . Alzheimer's dementia 03/26/2015  . GERD (gastroesophageal reflux disease) 03/26/2015  . HTN (hypertension) 03/26/2015  . CKD (chronic kidney disease), stage III 03/26/2015  . S/P cholecystectomy 03/26/2015    Past Medical History:  Past Medical History  Diagnosis Date  . Hypertension   . Senile-onset Alzheimer's dementia with behavioral disturbance   . Verrucous keratosis   . Complete tear of right rotator cuff   . Hyperlipidemia   . Hypothyroidism   . Osteoporosis   . Chronic kidney disease, stage 3   . Keratoconjunctivitis due to Adenovirus   . GERD without esophagitis   . Noninfective gastroenteritis and colitis   . Arthritis   . Infectious colitis   . Diverticulosis   . Status post dilation of esophageal narrowing    Past Surgical History:  Past Surgical History  Procedure Laterality Date  . Cholecystectomy    . Abdominal hysterectomy    . Breast surgery  Bilateral     Assessment & Plan Clinical Impression: Patient is a 82 y.o. year old female with recent admission to the hospital on 01/07/2016 with acute onset of right-sided weakness. By report patient recently was taken to [facility] 2 days prior diagnosis of urinary tract infection placed on Cipro. Patient lives With daughter and who assists patient as needed, but has a daughter who does assist with homemaking and meal preparation. She has a personal care attendant 3 times a week. She was independent with mobility without assistive device but assistance for safety was provided. [facility] Kitchen MRI of the brain showed small to moderate volume of acute infarct high left frontal lobe predominantly involving the ACA territory. MRA with moderate to severe proximal left A2 ACA stenosis. Right ACA proximal A2 occlusion. Patient transferred to CIR on 01/12/2016 .    Patient currently requires max with basic self-care skills secondary to muscle weakness, motor apraxia, decreased coordination and decreased motor planning, decreased attention to right and decreased motor planning, decreased initiation, decreased awareness, decreased problem solving, decreased safety awareness, decreased memory and delayed processing and decreased standing balance, hemiplegia and decreased balance strategies.  Prior to hospitalization, patient could complete ADLs with min.  Patient will benefit from skilled intervention to decrease level of assist with basic self-care skills and increase independence with basic self-care skills prior to discharge home with care partner.  Anticipate patient will require minimal physical assistance and follow up home health.  OT - End of Session Activity Tolerance: Tolerates 30+ min activity with multiple rests Endurance Deficit: Yes  OT Assessment Rehab Potential (ACUTE ONLY): Good OT Patient demonstrates impairments in the following area(s):  Balance;Endurance;Motor;Perception;Sensory;Safety;Cognition OT Basic ADL's Functional Problem(s): Eating;Grooming;Bathing;Dressing;Toileting OT Transfers Functional Problem(s): Toilet;Tub/Shower OT Additional Impairment(s): Fuctional Use of Upper Extremity OT Plan OT Intensity: Minimum of 1-2 x/day, 45 to 90 minutes OT Frequency: 5 out of 7 days OT Duration/Estimated Length of Stay: 12-14 days OT Treatment/Interventions: Balance/vestibular training;Discharge planning;DME/adaptive equipment instruction;Functional mobility training;Patient/family education;Self Care/advanced ADL retraining;Therapeutic Exercise;UE/LE Strength taining/ROM;UE/LE Coordination activities;Therapeutic Activities;Neuromuscular re-education OT Self Feeding Anticipated Outcome(s): supervision OT Basic Self-Care Anticipated Outcome(s): min assist level OT Toileting Anticipated Outcome(s): min assist OT Bathroom Transfers Anticipated Outcome(s): min assist  OT Recommendation Patient destination: Home Follow Up Recommendations: 24 hour supervision/assistance Equipment Recommended: To be determined   Skilled Therapeutic Intervention Pt completed bathing and dressing tasks EOB.  Mod assist for supine to sit and scooting out to the EOB, but pt initiated bringing trunk upright prior to therapist assist with scooting.  Once on the EOB.  She was able to wash her face when presented with washcloth but needed max hand over hand to complete any parts of washing her UB.  She was able to wash both sides of her upper leg with mod instructional and cueing but could not wash any other parts secondary to decreased understanding of instruction and decreased initiation.  Mod assist for sit to stand with LB selfcare however, pt with moderate posterior lean requiring total assist to maintain static standing balance.  Max assist for transfer to the wheelchair from EOB.  She attempted combing her hair but demonstrated decreased thoroughness with  use of either hand requiring max assist from therapist.  Pt's daughter came in at end of session and reports they provided variable assist for bathing tasks (min to max) as well as for UB dressing.  They report that pt could donn her pants with setup prior to this event.  Discussed with them the need for 24 hour assist at discharge and they are planning to move in with her at discharge.    OT Evaluation Precautions/Restrictions  Precautions Precautions: Fall Precaution Comments: hx of dementia, expressive delays, right in-attention and slight weakness Restrictions Weight Bearing Restrictions: No  Pain Pain Assessment Pain Assessment: No/denies pain Home Living/Prior Functioning Home Living Available Help at Discharge: Family, Friend(s) Type of Home: House Home Access: Stairs to enter Technical brewer of Steps: 1 Entrance Stairs-Rails: None Home Layout: One level Bathroom Shower/Tub: Tub/shower unit, Other (comment) (Shower chair and hand held spray nozzle ) Bathroom Toilet: Standard (Raised toilet seat over standard toilet ) Bathroom Accessibility: Yes (WC can get in, but difficulty )  Lives With: Alone, Other (Comment) (Duaghter lives next door. ) Prior Function Level of Independence: Independent with basic ADLs, Needs assistance with ADLs, Needs assistance with homemaking  Able to Take Stairs?: Yes Driving: No Vocation: Retired ADL  See Function Section of chart  Vision/Perception  Vision- History Baseline Vision/History: No visual deficits (Vision not formally assessed with continue to exam in context of function) Vision- Assessment Additional Comments: Pt able to scan to the right and left accurately but does not follow one step commands consistently.  Will look at vision further in functional context as well as question family about her PLOF.  Perception Perception: Impaired Inattention/Neglect: Does not attend to right side of body Praxis Praxis: Impaired Praxis  Impairment Details: Initiation;Motor planning Praxis-Other Comments: Pt with decreased ability to move the RLE to command however, she would lift the left spontaneously when therapist was helping her  to donn her sock or pants.  Will continue to further assess in function.    Cognition Overall Cognitive Status: Impaired/Different from baseline Arousal/Alertness: Awake/alert Memory: Impaired Memory Impairment: Decreased short term memory Decreased Short Term Memory: Functional basic Attention: Focused;Sustained Focused Attention: Appears intact Sustained Attention: Impaired Sustained Attention Impairment: Functional basic;Verbal basic Divided Attention: Impaired Divided Attention Impairment: Functional basic Awareness: Impaired Awareness Impairment: Intellectual impairment Problem Solving: Impaired Problem Solving Impairment: Functional basic Safety/Judgment: Impaired Comments: Pt not consistently following one step commands related to selfcare tasks, even with hand over hand assistance during bathing.  Not oriented to place, time, or situation.  At times pt would laugh in response the therapist's statements.  Inconsistent and limited verbal responses to therapist's questions, usually one to two words when she did respond.   Sensation Sensation Light Touch: Not tested Stereognosis: Not tested Hot/Cold: Not tested Proprioception: Not tested Additional Comments: Did not formally assess during session.   Coordination Gross Motor Movements are Fluid and Coordinated: No Fine Motor Movements are Fluid and Coordinated: No Coordination and Movement Description: Pt used the RUE during session for some aspects of bathing or grooming but with decreased efficiency overall.  Noted arthritic changes in her right hand as well.  Motor  Motor Motor: Motor apraxia;Hemiplegia;Abnormal postural alignment and control Motor - Skilled Clinical Observations: decreased motor planning in the RUE and LE along  with increased posterior bias in standing.   Mobility  Bed Mobility Bed Mobility: Supine to Sit Rolling Right: 2: Max assist Rolling Right Details: Manual facilitation for placement;Manual facilitation for weight bearing;Verbal cues for sequencing;Verbal cues for technique;Visual cues/gestures for precautions/safety;Tactile cues for placement;Tactile cues for weight shifting;Tactile cues for initiation;Tactile cues for sequencing Rolling Left: 2: Max assist Rolling Left Details: Tactile cues for initiation;Tactile cues for sequencing;Tactile cues for placement;Visual cues/gestures for precautions/safety;Visual cues/gestures for sequencing;Verbal cues for sequencing;Verbal cues for technique;Manual facilitation for weight shifting;Manual facilitation for placement Supine to Sit: 3: Mod assist;HOB flat Supine to Sit Details: Tactile cues for sequencing;Verbal cues for sequencing;Manual facilitation for placement;Manual facilitation for weight shifting Sit to Supine: 2: Max assist Sit to Supine - Details: Tactile cues for initiation;Tactile cues for sequencing;Tactile cues for weight shifting;Tactile cues for placement;Visual cues/gestures for sequencing;Verbal cues for sequencing;Verbal cues for technique;Manual facilitation for weight shifting;Manual facilitation for placement Transfers Transfers: Sit to Stand;Stand to Sit Sit to Stand: 3: Mod assist;With upper extremity assist;From bed Sit to Stand Details: Tactile cues for sequencing;Manual facilitation for weight shifting Stand to Sit: 3: Mod assist;With upper extremity assist;To bed Stand to Sit Details (indicate cue type and reason): Tactile cues for sequencing;Manual facilitation for weight shifting  Trunk/Postural Assessment  Cervical Assessment Cervical Assessment: Exceptions to [facility] (slight forward cervical protraction noted) Thoracic Assessment Thoracic Assessment: Exceptions to [facility] (thoracic kyphosis and rounding present in  sitting) Lumbar Assessment Lumbar Assessment: Exceptions to [facility] (posterior pelvic tilt in sitting) Postural Control Postural Control: Deficits on evaluation Righting Reactions: increased posterior lean in standing with decreased wieght shift forward over the mid foot.  Protective Responses: Delayed   Balance Balance Balance Assessed: Yes Static Sitting Balance Static Sitting - Balance Support: No upper extremity supported Static Sitting - Level of Assistance: 5: Stand by assistance Static Sitting - Comment/# of Minutes: [place] able to maintin sitting EOB without UE support.  Dynamic Sitting Balance Dynamic Sitting - Balance Support: During functional activity Dynamic Sitting - Level of Assistance: 5: Stand by assistance Sitting balance - Comments: UE suport needed when reaching for objects outside  BOS Static Standing Balance Static Standing - Balance Support: Right upper extremity supported;Left upper extremity supported Static Standing - Level of Assistance: 1: +1 Total assist Static Standing - Comment/# of Minutes: [place] unable to maintian balance for >3 seconds without Min-Mod A from PT  Dynamic Standing Balance Dynamic Standing - Balance Support: No upper extremity supported Dynamic Standing - Level of Assistance: 1: +1 Total assist Extremity/Trunk Assessment RUE Assessment RUE Assessment: Exceptions to [facility] RUE Strength RUE Overall Strength Comments: Pt able to use the RUE spontaneously during selfcare tasks but still inconsistent secondary to delayed initiation.  Unable to formally test secondary to impaired ability to follow instructional/demonstrational commands.  She was able to resist elbow extension at 3+/5 level as well as shoulder flexion 3-/5 level,  grip strength 3+/5 with noted artritic changes in the digits.    LUE Assessment LUE Assessment: Within Functional Limits (per gross assessment)   See Function Navigator for Current Functional Status.   Refer to Care  Plan for Long Term Goals  Recommendations for other services: None  Discharge Criteria: Patient will be discharged from OT if patient refuses treatment 3 consecutive times without medical reason, if treatment goals not met, if there is a change in medical status, if patient makes no progress towards goals or if patient is discharged from hospital.  The above assessment, treatment plan, treatment alternatives and goals were discussed and mutually agreed upon: by family  Jaidee Stipe OTR/L 01/13/2016, 4:04 PM

## 2016-01-14 ENCOUNTER — Inpatient Hospital Stay (HOSPITAL_COMMUNITY): Payer: Medicare Other | Admitting: Physical Therapy

## 2016-01-14 ENCOUNTER — Inpatient Hospital Stay (HOSPITAL_COMMUNITY): Payer: Medicare Other | Admitting: Occupational Therapy

## 2016-01-14 ENCOUNTER — Inpatient Hospital Stay (HOSPITAL_COMMUNITY): Payer: Medicare Other | Admitting: Speech Pathology

## 2016-01-14 ENCOUNTER — Inpatient Hospital Stay (HOSPITAL_COMMUNITY): Payer: Medicare Other

## 2016-01-14 MED ORDER — AMOXICILLIN-POT CLAVULANATE 500-125 MG PO TABS
1.0000 | ORAL_TABLET | Freq: Three times a day (TID) | ORAL | Status: AC
  Administered 2016-01-14 – 2016-01-16 (×9): 500 mg via ORAL
  Filled 2016-01-14 (×9): qty 1

## 2016-01-14 NOTE — Progress Notes (Signed)
Kayla KohutFrank Barthold, OT notified RN of patient's prescription medication of Ciprofloxacin HCL 500mg . Bottle contained 16 tablets. RN notified patient's daughter of medication and informed her that medication will be in a sealed bag for her to pick up tomorrow. Daughter was agreeable to this.

## 2016-01-14 NOTE — Progress Notes (Signed)
Occupational Therapy Session Note  Patient Details  Name: Kayla SantiagoMary C Switala MRN: [id number] Date of Birth: 1931-07-05  Today's Date: 01/14/2016 OT Individual Time: 1100-1200 OT Individual Time Calculation (min): 60 min   Short Term Goals: Week 1:  OT Short Term Goal 1 (Week 1): Pt will complete UB bathing with min assist and mod instructional cueing. OT Short Term Goal 2 (Week 1): Pt will donn pullover shirt with mod assist sitting EOB or EOC. OT Short Term Goal 3 (Week 1): Pt will complete shower transfer with min assist using RW stand pivot. OT Short Term Goal 4 (Week 1): Pt will donn pull up pants with min assist sit to stand and mod instructional cueing.    Skilled Therapeutic Interventions/Progress Updates: ADL-retraining with focus on functional mobility using RW, attention, sequencing, awareness, toilet transfer, toileting, and assisted bathing/dressing skills.   Pt received seated in her w/c with foul odor apparent, prompting therapist to insist on escort to toilet.   Pt minimally expressive but compliant with simple hand gestures and hand-guidance to initiate.   Pt able to rise to stand from w/c with manual facilitation and placement to initiate stand (pt = 70%).   Pt ambulated with steadying assist and completed transfer to toilet after assist for clothing management.  Pt required total assist for toilet hygiene and was unable to execute a motor planning resembling attempt at self-cleaning.   Pt returned to sit on toilet to complete bathing with overall max-total assist to progress and hand-over-hand assist required for thoroughness.   Pt does not sustain attention to task and demo'd perseveration with merely washing her hands and face.   Pt dressed on tub bench in bathroom with overall total assist although managing lift and place her left leg and arm when cued.   Pt then ambulated to sink with mod assist for gait and groomed standing (combing 25% of her hair although not attending to mirror to  inspect herself).   Pt returned to w/c at end of session with QRB applied and call light placed on her lap.        Therapy Documentation Precautions:  Precautions Precautions: Fall Precaution Comments: hx of dementia, expressive delays, right in-attention and slight weakness Restrictions Weight Bearing Restrictions: No  Pain: Pain Assessment Pain Assessment: No/denies pain PAINAD (Pain Assessment in Advanced Dementia) Breathing: normal Negative Vocalization: none Facial Expression: smiling or inexpressive Body Language: relaxed Consolability: no need to console PAINAD Score: 0  See Function Navigator for Current Functional Status.   Therapy/Group: Individual Therapy  Angelmarie Ponzo 01/14/2016, 12:26 PM

## 2016-01-14 NOTE — Progress Notes (Signed)
Subjective/Complaints: Patient confused does not remember eating breakfast. Daughter is not surprised by this. Requires supervision at home although her mental status is worse than it was prior to the stroke ROS- cannot obtain due to Mental status Objective: Vital Signs: Blood pressure 142/66, pulse 79, temperature 98.3 F (36.8 C), temperature source Oral, resp. rate 16, SpO2 92 %. No results found. Results for orders placed or performed during the hospital encounter of 01/12/16 (from the past 72 hour(s))  CBC     Status: None   Collection Time: 01/12/16  6:08 PM  Result Value Ref Range   WBC 9.1 4.0 - 10.5 K/uL   RBC 4.29 3.87 - 5.11 MIL/uL   Hemoglobin 12.8 12.0 - 15.0 g/dL   HCT 39.9 36.0 - 46.0 %   MCV 93.0 78.0 - 100.0 fL   MCH 29.8 26.0 - 34.0 pg   MCHC 32.1 30.0 - 36.0 g/dL   RDW 13.6 11.5 - 15.5 %   Platelets 292 150 - 400 K/uL  Creatinine, serum     Status: Abnormal   Collection Time: 01/12/16  6:08 PM  Result Value Ref Range   Creatinine, Ser 0.91 0.44 - 1.00 mg/dL   GFR calc non Af Amer 57 (L) >60 mL/min   GFR calc Af Amer >60 >60 mL/min    Comment: (NOTE) The eGFR has been calculated using the CKD EPI equation. This calculation has not been validated in all clinical situations. eGFR's persistently <60 mL/min signify possible Chronic Kidney Disease.   CBC WITH DIFFERENTIAL     Status: Abnormal   Collection Time: 01/13/16  6:37 AM  Result Value Ref Range   WBC 10.2 4.0 - 10.5 K/uL   RBC 4.02 3.87 - 5.11 MIL/uL   Hemoglobin 12.4 12.0 - 15.0 g/dL   HCT 36.7 36.0 - 46.0 %   MCV 91.3 78.0 - 100.0 fL   MCH 30.8 26.0 - 34.0 pg   MCHC 33.8 30.0 - 36.0 g/dL   RDW 13.3 11.5 - 15.5 %   Platelets 285 150 - 400 K/uL   Neutrophils Relative % 59 %   Neutro Abs 6.1 1.7 - 7.7 K/uL   Lymphocytes Relative 23 %   Lymphs Abs 2.4 0.7 - 4.0 K/uL   Monocytes Relative 12 %   Monocytes Absolute 1.2 (H) 0.1 - 1.0 K/uL   Eosinophils Relative 5 %   Eosinophils Absolute 0.5 0.0  - 0.7 K/uL   Basophils Relative 1 %   Basophils Absolute 0.1 0.0 - 0.1 K/uL  Comprehensive metabolic panel     Status: Abnormal   Collection Time: 01/13/16  6:37 AM  Result Value Ref Range   Sodium 143 135 - 145 mmol/L   Potassium 3.6 3.5 - 5.1 mmol/L   Chloride 111 101 - 111 mmol/L   CO2 22 22 - 32 mmol/L   Glucose, Bld 93 65 - 99 mg/dL   BUN 14 6 - 20 mg/dL   Creatinine, Ser 0.90 0.44 - 1.00 mg/dL   Calcium 8.2 (L) 8.9 - 10.3 mg/dL   Total Protein 6.1 (L) 6.5 - 8.1 g/dL   Albumin 2.6 (L) 3.5 - 5.0 g/dL   AST 24 15 - 41 U/L   ALT 18 14 - 54 U/L   Alkaline Phosphatase 62 38 - 126 U/L   Total Bilirubin 0.6 0.3 - 1.2 mg/dL   GFR calc non Af Amer 58 (L) >60 mL/min   GFR calc Af Amer >60 >60 mL/min    Comment: (  NOTE) The eGFR has been calculated using the CKD EPI equation. This calculation has not been validated in all clinical situations. eGFR's persistently <60 mL/min signify possible Chronic Kidney Disease.    Anion gap 10 5 - 15     General: No acute distress Mood and affect are appropriate Heart: Regular rate and rhythm no rubs murmurs or extra sounds Lungs: Clear to auscultation, breathing unlabored, no rales or wheezes Abdomen: Positive bowel sounds, soft nontender to palpation, nondistended Extremities: No clubbing, cyanosis, or edema Skin: No evidence of breakdown, no evidence of rash Neurologic: Cranial nerves II through XII intact, motor strength is difficult to assess due to apraxia and decreased abilitySensory exam normal sensation to light touch and proprioception in bilateral upper and lower extremities Cerebellar exam normal finger to nose to finger as well as heel to shin in bilateral upper and lower extremities Musculoskeletal: Full range of motion in all 4 extremities. No joint swelling   Assessment/Plan: 1. Functional deficits secondary to Right hemiparesis due to Left post frontal infarct which require 3+ hours per day of interdisciplinary therapy in a  comprehensive inpatient rehab setting. Physiatrist is providing close team supervision and 24 hour management of active medical problems listed below. Physiatrist and rehab team continue to assess barriers to discharge/monitor patient progress toward functional and medical goals. FIM: Function - Bathing Position: Sitting EOB Body parts bathed by patient: Right upper leg, Left upper leg Body parts bathed by helper: Right lower leg, Left lower leg, Back, Buttocks, Front perineal area, Abdomen, Chest, Right arm Assist Level: 2 helpers  Function- Upper Body Dressing/Undressing What is the patient wearing?: Button up shirt Button up shirt - Perfomed by patient: Thread/unthread left sleeve Button up shirt - Perfomed by helper: Pull shirt around back, Thread/unthread right sleeve, Button/unbutton shirt Function - Lower Body Dressing/Undressing What is the patient wearing?: Non-skid slipper socks, Ted Hose, Socks, Pants Position: Sitting EOB Pants- Performed by helper: Thread/unthread right pants leg, Thread/unthread left pants leg, Pull pants up/down, Fasten/unfasten pants Non-skid slipper socks- Performed by helper: Don/doff right sock, Don/doff left sock TED Hose - Performed by helper: Don/doff right TED hose, Don/doff left TED hose  Function - Toileting Toileting activity did not occur: No continent bowel/bladder event Toileting steps completed by helper: Adjust clothing prior to toileting, Performs perineal hygiene, Adjust clothing after toileting     Function - Chair/bed transfer Chair/bed transfer assist level: Moderate assist (Pt 50 - 74%/lift or lower) Chair/bed transfer assistive device: Armrests, Walker Chair/bed transfer details: Tactile cues for initiation, Tactile cues for sequencing, Tactile cues for weight shifting, Tactile cues for placement, Visual cues/gestures for sequencing, Visual cues for safe use of DME/AE, Visual cues/gestures for precautions/safety, Verbal cues for  sequencing, Verbal cues for technique, Manual facilitation for placement, Manual facilitation for weight shifting, Verbal cues for safe use of DME/AE  Function - Locomotion: Wheelchair Will patient use wheelchair at discharge?: Yes Type: Manual Max wheelchair distance: 19f Assist Level: Maximal assistance (Pt 25 - 49%) Wheel 50 feet with 2 turns activity did not occur: Safety/medical concerns Wheel 150 feet activity did not occur: Safety/medical concerns Turns around,maneuvers to table,bed, and toilet,negotiates 3% grade,maneuvers on rugs and over doorsills: No Function - Locomotion: Ambulation Assistive device: Walker-rolling Max distance: 241fAssist level: Moderate assist (Pt 50 - 74%) Assist level: Moderate assist (Pt 50 - 74%) Walk 50 feet with 2 turns activity did not occur: Safety/medical concerns Walk 150 feet activity did not occur: Safety/medical concerns Assist level: Moderate assist (Pt  50 - 74%)  Function - Comprehension Comprehension: Auditory Comprehension assist level: Understands basic 25 - 49% of the time/ requires cueing 50 - 75% of the time  Function - Expression Expression: Verbal Expression assist level: Expresses basis less than 25% of the time/requires cueing >75% of the time.  Function - Social Interaction Social Interaction assist level: Interacts appropriately 25 - 49% of time - Needs frequent redirection.  Function - Problem Solving Problem solving assist level: Solves basic less than 25% of the time - needs direction nearly all the time or does not effectively solve problems and may need a restraint for safety  Function - Memory Memory assist level: Recognizes or recalls less than 25% of the time/requires cueing greater than 75% of the time Patient normally able to recall (first 3 days only): None of the above  Medical Problem List and Plan: 1.  Right sided weakness secondary to left posterior frontal lobe infarct- initiate rehab program, unable to  set d/c date yet given limited therapy thus far 2.  DVT Prophylaxis/Anticoagulation: Subcutaneous Lovenox. Monitor platelet counts and any signs of bleeding, last plt on 3/15 was 285K 3. Pain Management: Tylenol as needed 4. Mood/dementia: Namenda 28 mg daily,Exelon patch, moderate dementia with acute worsening of cognition due to CVA 5. Neuropsych: This patient is not capable of making decisions on her own behalf. 6. Skin/Wound Care: Routine skin checks,IV site looks okay, will DC IV due to good intake as well as switching from Unasyn to Augmentin 7. Fluids/Electrolytes/Nutrition: Routine I&O with follow-up chemistries 8. Left lower lobe pneumonia. augmentin through 3/18 9. Hypothyroidism. Synthroid. TSH 4.258 10. Chronic renal insufficiency. Baseline creatinine 1.08. Follow-up chemistries stable/normal 11. Hyperlipidemia. Lipitor 12.  Hypoalbuminemia - prostat  LOS (Days) 2 A FACE TO FACE EVALUATION WAS PERFORMED  KIRSTEINS,ANDREW E 01/14/2016, 8:19 AM

## 2016-01-14 NOTE — Progress Notes (Signed)
Physical Therapy Session Note  Patient Details  Name: [name] MRN: [id number] Date of Birth: 01/10/1931  Today's Date: 01/14/2016 PT Individual Time: 0900-0957 PT Individual Time Calculation (min): 57 min   Short Term Goals: Week 1:  PT Short Term Goal 1 (Week 1): Patient will be able to perform bed mobility with Mod A.  PT Short Term Goal 2 (Week 1): Patient will be able to ambulate 75ft with RW with mod A PT Short Term Goal 3 (Week 1): Patient will perform WC<>bed transfer with min A  PT Short Term Goal 4 (Week 1): Patient will be able to Ascend 1 step with mod A.   Skilled Therapeutic Interventions/Progress Updates:    Patient received Supine in bed with RN present to remove IV. Bed mobility with Mod A for Roll R and L to donn pants. Supine to sit with mod A and max cues for sequencing. WC mobility with BUE propulsion. Max A with R UE to improve grip and motion for straight tragectory. Standing balance with BUE support x 3 minutes. Standin balance with 1 UE support and reach with L arm for cups at abdomin height. Gait training. For 25 ft. With RW and Mod A from PT and constant cues for R LE initiation. Sitting therex with toe taps on 2 inch step with L LE; unable to initiate on R LE. Toe taps on dyna dysc with L LE x 10 and R LE x 8 with min-mod A for initiation. Throughout treatment session, patient performed sit<>stand and stand pivot with RW with min-mod A depending on surface height. Patient returned to room in WC and left sitting in WC with quick release belt in place, family present and all other needs met.   Therapy Documentation Precautions:  Precautions Precautions: Fall Precaution Comments: hx of dementia, expressive delays, right in-attention and slight weakness Restrictions Weight Bearing Restrictions: No   Pain: Pain Assessment Pain Assessment: No/denies pain PAINAD (Pain Assessment in Advanced Dementia) Breathing: normal Negative Vocalization: none Facial  Expression: smiling or inexpressive Body Language: relaxed Consolability: no need to console PAINAD Score: 0   See Function Navigator for Current Functional Status.   Therapy/Group: Individual Therapy   E  01/14/2016, 12:50 PM  

## 2016-01-14 NOTE — Progress Notes (Signed)
Speech Language Pathology Daily Session Note  Patient Details  Name: Kayla SantiagoMary C Schroeder MRN: [id number] Date of Birth: [birth year]  Today's Date: 01/14/2016 SLP Individual Time: 1500-1530 SLP Individual Time Calculation (min): 30 min  Short Term Goals: Week 1: SLP Short Term Goal 1 (Week 1): Pt will follow 1 step commands during basic, familiar tasks with max assist multimodal cues.   SLP Short Term Goal 2 (Week 1): Pt will answer basic, biographical yes/no questions for >50% accuracy with max assist multimodal cues.  SLP Short Term Goal 3 (Week 1): Pt will sustain her attention to a basic familiar task for ~3 minute intervals with max assist verbal and tactile cues for redirection.   SLP Short Term Goal 4 (Week 1): Pt will complete a basic, familiar task with max assist verbal and tactile cues for initiation and sequencing.   SLP Short Term Goal 5 (Week 1): Pt will utilize call bell to convey needs/wants to staff members with max assist verbal and tactile cues.    Skilled Therapeutic Interventions: Skilled treatment session focused on cognitive-linguistic goals. SLP facilitated session by providing Max A multimodal cues for patient to verbally respond to basic yes/no questions in regards to biographical information with ~25% accuracy. Patient with intermittent automatic verbal expression at the phrase level but demonstrated inappropriate laughter throughout the entire session. Patient required overall Max A for sustained attention to tasks for ~60 seconds and was unable to return demonstration for use of call bell despite Max A multimodal cues. Difficult to differentiate severity of cognitive impairments vs. true language impairments. Patient left at RN station with quick release belt in place. Continue with current plan of care.     Function:  Cognition Comprehension Comprehension assist level: Understands basic less than 25% of the time/ requires cueing >75% of the time  Expression   Expression  assist level: Expresses basis less than 25% of the time/requires cueing >75% of the time.  Social Interaction Social Interaction assist level: Interacts appropriately less than 25% of the time. May be withdrawn or combative.  Problem Solving Problem solving assist level: Solves basic less than 25% of the time - needs direction nearly all the time or does not effectively solve problems and may need a restraint for safety  Memory Memory assist level: Recognizes or recalls less than 25% of the time/requires cueing greater than 75% of the time    Pain Pain Assessment Pain Assessment: Faces Faces Pain Scale: No hurt  Therapy/Group: Individual Therapy  [name] 01/14/2016, 4:33 PM

## 2016-01-14 NOTE — Progress Notes (Signed)
Occupational Therapy Session Note  Patient Details  Name: [name] MRN: [id number] Date of Birth: 10/30/1931  Today's Date: 01/14/2016 OT Individual Time: [id number] OT Individual Time Calculation (min): 46 min    Short Term Goals: Week 1:  OT Short Term Goal 1 (Week 1): Pt will complete UB bathing with min assist and mod instructional cueing. OT Short Term Goal 2 (Week 1): Pt will donn pullover shirt with mod assist sitting EOB or EOC. OT Short Term Goal 3 (Week 1): Pt will complete shower transfer with min assist using RW stand pivot. OT Short Term Goal 4 (Week 1): Pt will donn pull up pants with min assist sit to stand and mod instructional cueing.    Skilled Therapeutic Interventions/Progress Updates:    Pt worked on self feeding for most of session from wheelchair level.  She was able to feed herself consistently with the left hand majority of session, however she would only use her fingers to do this and not a utensil.  She would proceed to pick up her sfuffing, [place] with gravy, and broccoli with use if her pincer grasp unless therapist placed food on the fork or spoon and then put it in her left hand.  With this she would then bring the utensil to her mouth and eat.  However, she would not continue to use the utensil but instead would place it down on the table.  Therapist would have to continue scooping up the food and placing the utensil in her hand for each time.  If utensil was placed in the right hand she would occasionally bring it to mouth but most of the time she just held it and continued to feed herself with fingers of the left hand.  Pt continually laughing at times throughout session also.  When eating ice cream she would attempt to drink it instead of using a utensil.  Finished session with functional mobility around the nurses station and back to her room.  Initial standing required mod assist but then she was able to ambulate with min hand held assist from 2 therapists, one  on each side.  Short step length noted on the right side.  Pt left in wheelchair in room with call button in lap and safety belt in place.  Pt responded "yes" to therapist when he asked if she would like to watch TV.  Still with very little conversation during session overall, basically limited to 1-2 word answers to questions when she did reply.  Therapy Documentation Precautions:  Precautions Precautions: Fall Precaution Comments: hx of dementia, expressive delays, right in-attention and slight weakness Restrictions Weight Bearing Restrictions: No  Pain: Pain Assessment Pain Assessment: Faces Faces Pain Scale: No hurt ADL: See Function Navigator for Current Functional Status.   Therapy/Group: Individual Therapy  Mieko Kneebone OTR/L 01/14/2016, 4:03 PM

## 2016-01-15 ENCOUNTER — Inpatient Hospital Stay (HOSPITAL_COMMUNITY): Payer: Medicare Other | Admitting: Occupational Therapy

## 2016-01-15 ENCOUNTER — Inpatient Hospital Stay (HOSPITAL_COMMUNITY): Payer: Medicare Other | Admitting: Speech Pathology

## 2016-01-15 ENCOUNTER — Inpatient Hospital Stay (HOSPITAL_COMMUNITY): Payer: Medicare Other | Admitting: Physical Therapy

## 2016-01-15 LAB — BASIC METABOLIC PANEL
ANION GAP: 13 (ref 5–15)
BUN: 11 mg/dL (ref 6–20)
CALCIUM: 8.6 mg/dL — AB (ref 8.9–10.3)
CO2: 22 mmol/L (ref 22–32)
CREATININE: 1.02 mg/dL — AB (ref 0.44–1.00)
Chloride: 105 mmol/L (ref 101–111)
GFR, EST AFRICAN AMERICAN: 57 mL/min — AB (ref >60)
GFR, EST NON AFRICAN AMERICAN: 49 mL/min — AB (ref >60)
Glucose, Bld: 153 mg/dL — ABNORMAL HIGH (ref 65–99)
Potassium: 3.5 mmol/L (ref 3.5–5.1)
SODIUM: 140 mmol/L (ref 135–145)

## 2016-01-15 LAB — URINALYSIS, ROUTINE W REFLEX MICROSCOPIC
BILIRUBIN URINE: NEGATIVE
Glucose, UA: NEGATIVE mg/dL
KETONES UR: NEGATIVE mg/dL
LEUKOCYTES UA: NEGATIVE
NITRITE: NEGATIVE
PH: 6 (ref 5.0–8.0)
PROTEIN: NEGATIVE mg/dL
Specific Gravity, Urine: 1.012 (ref 1.005–1.030)

## 2016-01-15 LAB — URINE MICROSCOPIC-ADD ON

## 2016-01-15 LAB — CBC WITH DIFFERENTIAL/PLATELET
BASOS ABS: 0.1 10*3/uL (ref 0.0–0.1)
BASOS PCT: 1 %
EOS ABS: 0.1 10*3/uL (ref 0.0–0.7)
EOS PCT: 1 %
HEMATOCRIT: 37.1 % (ref 36.0–46.0)
Hemoglobin: 12 g/dL (ref 12.0–15.0)
Lymphocytes Relative: 14 %
Lymphs Abs: 1.8 10*3/uL (ref 0.7–4.0)
MCH: 30.1 pg (ref 26.0–34.0)
MCHC: 32.3 g/dL (ref 30.0–36.0)
MCV: 93 fL (ref 78.0–100.0)
MONO ABS: 1.2 10*3/uL — AB (ref 0.1–1.0)
MONOS PCT: 10 %
NEUTROS ABS: 9.2 10*3/uL — AB (ref 1.7–7.7)
Neutrophils Relative %: 74 %
PLATELETS: 317 10*3/uL (ref 150–400)
RBC: 3.99 MIL/uL (ref 3.87–5.11)
RDW: 13.5 % (ref 11.5–15.5)
WBC: 12.5 10*3/uL — ABNORMAL HIGH (ref 4.0–10.5)

## 2016-01-15 LAB — CULTURE, BLOOD (ROUTINE X 2)
CULTURE: NO GROWTH
CULTURE: NO GROWTH

## 2016-01-15 LAB — GLUCOSE, CAPILLARY: GLUCOSE-CAPILLARY: 123 mg/dL — AB (ref 65–99)

## 2016-01-15 NOTE — Progress Notes (Signed)
Occupational Therapy Session Note  Patient Details  Name: Kayla SantiagoMary C Schroeder MRN: [id number] Date of Birth: [birth year]  Today's Date: 01/15/2016 OT Individual Time: 0905-1006 OT Individual Time Calculation (min): 61 min    Short Term Goals: Week 1:  OT Short Term Goal 1 (Week 1): Pt will complete UB bathing with min assist and mod instructional cueing. OT Short Term Goal 2 (Week 1): Pt will donn pullover shirt with mod assist sitting EOB or EOC. OT Short Term Goal 3 (Week 1): Pt will complete shower transfer with min assist using RW stand pivot. OT Short Term Goal 4 (Week 1): Pt will donn pull up pants with min assist sit to stand and mod instructional cueing.    Skilled Therapeutic Interventions/Progress Updates:    Pt's daughter present with pt at start of session.  She was assisting her mother with brushing her hair.  Noted pt with no verbal expressions to greeting or questions when asked at start of session.  She maintained flat affect.  Max assist for sit to stand from wheelchair to walk into shower with hand held assist.  Pt with greater posterior lean with sit to stand than previous session as well.  Decreased ability to coordinate stepping into the shower as well and sitting on the seat.  Pt noted crossing of LEs during this with inability to correct.  Sat on seat, requiring total assist for removal of clothing and brief.  Noted stool in brief as well.  Worked on bathing with pt only washing face to verbal instruction when presented with washcloth.  When given demonstrational cueing to wash other areas she would take the washcloth to them at times but demonstrate very little active attempt to wash.  Max hand over hand for all of bathing with max assist for standing to wash peri area.  Once pt was standing for greater than 30 seconds however, her balance improved to min assist level while holding on to the grab bar.  Total assist for drying off with max assist for stand step transfer to the  wheelchair.  Total assist for all dressing with pt initiating lifting the LLE up when therapist placed her pants down toward the floor, but no functional movement noted to lift and place the RLE in the pants leg.  Total assist +2 for standing to pull up brief and pants.  Placed button up shirt over her head and the needed mod assist to place the left arm in and total assist for the right as well as for pulling it down her back.  Pt left in wheelchair at end of session with her daughter and husband present in the room.  Discussed current level of assist as well as the need to work on maintaining a consistent routine at home to aid in her daily ADLs.  Safety belt in place.   Pt maintained flat affect most of session with only one instance of laughing and smiling while working on dressing.    Therapy Documentation Precautions:  Precautions Precautions: Fall Precaution Comments: hx of dementia, expressive delays, right in-attention and slight weakness Restrictions Weight Bearing Restrictions: No  Pain: Pain Assessment Pain Assessment: No/denies pain ADL: See Function Navigator for Current Functional Status.   Therapy/Group: Individual Therapy  Monika Chestang OTR/L 01/15/2016, 10:17 AM

## 2016-01-15 NOTE — Progress Notes (Signed)
Physical Therapy Session Note  Patient Details  Name: [name] MRN: [id number] Date of Birth: 10-09-1931  Today's Date: 01/15/2016 PT Individual Time: 0825-0856 PT Individual Time Calculation (min): 31 min   Short Term Goals: Week 1:  PT Short Term Goal 1 (Week 1): Patient will be able to perform bed mobility with Mod A.  PT Short Term Goal 2 (Week 1): Patient will be able to ambulate 7475ft with RW with mod A PT Short Term Goal 3 (Week 1): Patient will perform WC<>bed transfer with min A  PT Short Term Goal 4 (Week 1): Patient will be able to Ascend 1 step with mod A.   Skilled Therapeutic Interventions/Progress Updates:   Session focused on addressing functional bed mobility, sit <> stands, standing balance, attention, initiation, and overall endurance. Pt required mod to max assist for mobility with increased time and cues for initiation, sequencing, technique, and hand placement. Pt with strong posterior lean initially when in standing requiring mod assist for balance. As progressed, pt able to maintain balance with min assist using grab bar to change brief, perform hygiene, and pull up pants. End of session left up in w/c with RN administering medication and daughter present in room.  Therapy Documentation Precautions:  Precautions Precautions: Fall Precaution Comments: hx of dementia, expressive delays, right in-attention and slight weakness Restrictions Weight Bearing Restrictions: No  Pain: Does not report pain.    See Function Navigator for Current Functional Status.   Therapy/Group: Individual Therapy  Karolee StampsGray, Jakhai Fant Darrol PokeBrescia  Kourtney Terriquez B. Kauri Garson, PT, DPT  01/15/2016, 9:31 AM

## 2016-01-15 NOTE — Progress Notes (Signed)
Subjective/Complaints: Working with physical therapy. Attention span is just a few seconds. Needs hand over hand assist. Discussed with physical therapy ROS- cannot obtain due to Mental status Objective: Vital Signs: Blood pressure 147/63, pulse 94, temperature 98.7 F (37.1 C), temperature source Oral, resp. rate 18, SpO2 96 %. No results found. Results for orders placed or performed during the hospital encounter of 01/12/16 (from the past 72 hour(s))  CBC     Status: None   Collection Time: 01/12/16  6:08 PM  Result Value Ref Range   WBC 9.1 4.0 - 10.5 K/uL   RBC 4.29 3.87 - 5.11 MIL/uL   Hemoglobin 12.8 12.0 - 15.0 g/dL   HCT 39.9 36.0 - 46.0 %   MCV 93.0 78.0 - 100.0 fL   MCH 29.8 26.0 - 34.0 pg   MCHC 32.1 30.0 - 36.0 g/dL   RDW 13.6 11.5 - 15.5 %   Platelets 292 150 - 400 K/uL  Creatinine, serum     Status: Abnormal   Collection Time: 01/12/16  6:08 PM  Result Value Ref Range   Creatinine, Ser 0.91 0.44 - 1.00 mg/dL   GFR calc non Af Amer 57 (L) >60 mL/min   GFR calc Af Amer >60 >60 mL/min    Comment: (NOTE) The eGFR has been calculated using the CKD EPI equation. This calculation has not been validated in all clinical situations. eGFR's persistently <60 mL/min signify possible Chronic Kidney Disease.   CBC WITH DIFFERENTIAL     Status: Abnormal   Collection Time: 01/13/16  6:37 AM  Result Value Ref Range   WBC 10.2 4.0 - 10.5 K/uL   RBC 4.02 3.87 - 5.11 MIL/uL   Hemoglobin 12.4 12.0 - 15.0 g/dL   HCT 36.7 36.0 - 46.0 %   MCV 91.3 78.0 - 100.0 fL   MCH 30.8 26.0 - 34.0 pg   MCHC 33.8 30.0 - 36.0 g/dL   RDW 13.3 11.5 - 15.5 %   Platelets 285 150 - 400 K/uL   Neutrophils Relative % 59 %   Neutro Abs 6.1 1.7 - 7.7 K/uL   Lymphocytes Relative 23 %   Lymphs Abs 2.4 0.7 - 4.0 K/uL   Monocytes Relative 12 %   Monocytes Absolute 1.2 (H) 0.1 - 1.0 K/uL   Eosinophils Relative 5 %   Eosinophils Absolute 0.5 0.0 - 0.7 K/uL   Basophils Relative 1 %   Basophils  Absolute 0.1 0.0 - 0.1 K/uL  Comprehensive metabolic panel     Status: Abnormal   Collection Time: 01/13/16  6:37 AM  Result Value Ref Range   Sodium 143 135 - 145 mmol/L   Potassium 3.6 3.5 - 5.1 mmol/L   Chloride 111 101 - 111 mmol/L   CO2 22 22 - 32 mmol/L   Glucose, Bld 93 65 - 99 mg/dL   BUN 14 6 - 20 mg/dL   Creatinine, Ser 0.90 0.44 - 1.00 mg/dL   Calcium 8.2 (L) 8.9 - 10.3 mg/dL   Total Protein 6.1 (L) 6.5 - 8.1 g/dL   Albumin 2.6 (L) 3.5 - 5.0 g/dL   AST 24 15 - 41 U/L   ALT 18 14 - 54 U/L   Alkaline Phosphatase 62 38 - 126 U/L   Total Bilirubin 0.6 0.3 - 1.2 mg/dL   GFR calc non Af Amer 58 (L) >60 mL/min   GFR calc Af Amer >60 >60 mL/min    Comment: (NOTE) The eGFR has been calculated using the CKD EPI  equation. This calculation has not been validated in all clinical situations. eGFR's persistently <60 mL/min signify possible Chronic Kidney Disease.    Anion gap 10 5 - 15     General: No acute distress Mood and affect are appropriate Heart: Regular rate and rhythm no rubs murmurs or extra sounds Lungs: Clear to auscultation, breathing unlabored, no rales or wheezes Abdomen: Positive bowel sounds, soft nontender to palpation, nondistended Extremities: No clubbing, cyanosis, or edema Skin: No evidence of breakdown, no evidence of rash Neurologic: Cranial nerves II through XII intact, motor strength is difficult to assess due to apraxia and decreased abilitySensory exam normal sensation to light touch and proprioception in bilateral upper and lower extremities Cerebellar exam normal finger to nose to finger as well as heel to shin in bilateral upper and lower extremities Musculoskeletal: Full range of motion in all 4 extremities. No joint swelling   Assessment/Plan: 1. Functional deficits secondary to Right hemiparesis due to Left post frontal infarct which require 3+ hours per day of interdisciplinary therapy in a comprehensive inpatient rehab setting. Physiatrist  is providing close team supervision and 24 hour management of active medical problems listed below. Physiatrist and rehab team continue to assess barriers to discharge/monitor patient progress toward functional and medical goals. FIM: Function - Bathing Position: Sitting EOB (at [facility]) Body parts bathed by patient: Left upper leg, Left lower leg, Right arm Body parts bathed by helper: Buttocks, Right upper leg, Right lower leg, Back, Front perineal area Bathing not applicable: Chest, Abdomen Assist Level: Touching or steadying assistance(Pt > 75%)  Function- Upper Body Dressing/Undressing What is the patient wearing?: Bra, Pull over shirt/dress Bra - Perfomed by patient: Thread/unthread left bra strap Bra - Perfomed by helper: Thread/unthread right bra strap, Hook/unhook bra (pull down sports bra) Pull over shirt/dress - Perfomed by patient: Thread/unthread left sleeve Pull over shirt/dress - Perfomed by helper: Thread/unthread right sleeve, Put head through opening, Pull shirt over trunk Button up shirt - Perfomed by patient: Thread/unthread left sleeve Button up shirt - Perfomed by helper: Pull shirt around back, Thread/unthread right sleeve, Button/unbutton shirt Assist Level: Touching or steadying assistance(Pt > 75%) Function - Lower Body Dressing/Undressing What is the patient wearing?: Pants, Ted Hose, Shoes Position: Sitting EOB (on tub bench) Pants- Performed by patient: Thread/unthread left pants leg, Pull pants up/down Pants- Performed by helper: Thread/unthread right pants leg, Pull pants up/down Non-skid slipper socks- Performed by helper: Don/doff right sock, Don/doff left sock Shoes - Performed by patient: Don/doff left shoe Shoes - Performed by helper: Don/doff right shoe TED Hose - Performed by helper: Don/doff right TED hose, Don/doff left TED hose  Function - Toileting Toileting activity did not occur: No continent bowel/bladder event Toileting steps completed by  helper: Adjust clothing prior to toileting, Performs perineal hygiene, Adjust clothing after toileting  Function - Air cabin crew transfer assistive device: Walker Assist level to toilet: Total assist (Pt < 25%) Assist level from toilet: Maximal assist (Pt 25 - 49%/lift and lower)  Function - Chair/bed transfer Chair/bed transfer assist level: Moderate assist (Pt 50 - 74%/lift or lower) Chair/bed transfer assistive device: Armrests, Walker Chair/bed transfer details: Verbal cues for sequencing, Verbal cues for technique, Verbal cues for precautions/safety, Visual cues/gestures for sequencing, Tactile cues for sequencing, Tactile cues for posture, Tactile cues for weight shifting, Manual facilitation for weight shifting, Manual facilitation for placement  Function - Locomotion: Wheelchair Will patient use wheelchair at discharge?: Yes Type: Manual Max wheelchair distance: 41f Assist Level: Maximal assistance (  Pt 25 - 49%) Wheel 50 feet with 2 turns activity did not occur: Safety/medical concerns Wheel 150 feet activity did not occur: Safety/medical concerns Turns around,maneuvers to table,bed, and toilet,negotiates 3% grade,maneuvers on rugs and over doorsills: No Function - Locomotion: Ambulation Assistive device: Walker-rolling Max distance: 30 Assist level: Moderate assist (Pt 50 - 74%) Assist level: Moderate assist (Pt 50 - 74%) Walk 50 feet with 2 turns activity did not occur: Safety/medical concerns Walk 150 feet activity did not occur: Safety/medical concerns Assist level: Moderate assist (Pt 50 - 74%)  Function - Comprehension Comprehension: Auditory Comprehension assist level: Understands basic less than 25% of the time/ requires cueing >75% of the time  Function - Expression Expression: Verbal Expression assist level: Expresses basis less than 25% of the time/requires cueing >75% of the time.  Function - Social Interaction Social Interaction assist level:  Interacts appropriately less than 25% of the time. May be withdrawn or combative.  Function - Problem Solving Problem solving assist level: Solves basic less than 25% of the time - needs direction nearly all the time or does not effectively solve problems and may need a restraint for safety  Function - Memory Memory assist level: Recognizes or recalls less than 25% of the time/requires cueing greater than 75% of the time Patient normally able to recall (first 3 days only): None of the above  Medical Problem List and Plan: 1.  Right sided weakness secondary to left posterior frontal lobe infarct- continue CIR, cognitive deficits will limit progress               2.  DVT Prophylaxis/Anticoagulation: Subcutaneous Lovenox. Monitor platelet counts and any signs of bleeding, last plt on 3/15 was 285K 3. Pain Management: Tylenol as needed 4. Mood/dementia: Namenda 28 mg daily,Exelon patch, moderate dementia with acute worsening of cognition due to CVA 5. Neuropsych: This patient is not capable of making decisions on her own behalf. 6. Skin/Wound Care: Routine skin checks,7. Fluids/Electrolytes/Nutrition: Routine I&O with follow-up chemistries 8. Left lower lobe pneumonia. augmentin through 3/18, no clinical signs of pneumonia 9. Hypothyroidism. Synthroid. TSH 4.258, continue home dose of 0.43m 10. Chronic renal insufficiency. Baseline creatinine 1.08. Follow-up chemistries stable/normal 11. Hyperlipidemia. Lipitor 12.  Hypoalbuminemia - prostat  LOS (Days) 3 A FACE TO FACE EVALUATION WAS PERFORMED  KIRSTEINS,ANDREW E 01/15/2016, 9:33 AM

## 2016-01-15 NOTE — Progress Notes (Signed)
Physical Therapy Note  Patient Details  Name: Kayla SantiagoMary C Schroeder MRN: [id number] Date of Birth: 09/22/1931 Today's Date: 01/15/2016    Time: 1300-1400 60 minutes  1:1 No c/o pain. Pt finishing lunch on PT arrival. PT assisted pt with meal with cues for attention to task, and Rt attention. Pt requires mod cuing for items on the Rt of her meal tray.  Gait with RW with mod A for staight gait, max A for turns due to strong posterior LOB and decreased balance reactions, unable to self correct LOB.  Gait training with emphasis on turns with pt requiring cues for initiation, manual faciliation for Rt swing phase, mod A for balance with turns.  Standing balance/sorting task with matching cards. Pt mod A for standing balance without UE support due to posterior lean, pt total A for card matching.  Standing clothespin task with mod tactile and visual cues for clipping clothespins, mod A for standing balance.  Pt fatigues easily and needs frequent rests. Pt requires cues for attention to task and Rt awareness throughout treatment.   Sophronia Varney 01/15/2016, 1:57 PM

## 2016-01-15 NOTE — Progress Notes (Signed)
Speech Language Pathology Daily Session Note  Patient Details  Name: Kayla SantiagoMary C Colgate MRN: [id number] Date of Birth: 1931/02/04  Today's Date: 01/15/2016 SLP Individual Time: [id number] SLP Individual Time Calculation (min): 28 min  Short Term Goals: Week 1: SLP Short Term Goal 1 (Week 1): Pt will follow 1 step commands during basic, familiar tasks with max assist multimodal cues.   SLP Short Term Goal 2 (Week 1): Pt will answer basic, biographical yes/no questions for >50% accuracy with max assist multimodal cues.  SLP Short Term Goal 3 (Week 1): Pt will sustain her attention to a basic familiar task for ~3 minute intervals with max assist verbal and tactile cues for redirection.   SLP Short Term Goal 4 (Week 1): Pt will complete a basic, familiar task with max assist verbal and tactile cues for initiation and sequencing.   SLP Short Term Goal 5 (Week 1): Pt will utilize call bell to convey needs/wants to staff members with max assist verbal and tactile cues.    Skilled Therapeutic Interventions: Pt positioned upright in bed. Lethragic, but rousable with verbal/tactile stim. Pt did not demonstrate any verbalizations throughout session despite automatic speech tasks/singing and family member attempts at greeting. Pt's daughter verbalized concern that her mom "looks like she did when she was having the stroke". RN was made aware. Pt became mildly diaphoretic. Pt trialed with thin via straw- oral holding with audible swallow noted. Will follow up with swallow assessment if warranted in the a.m.   Function:    Cognition Comprehension Comprehension assist level: Understands basic less than 25% of the time/ requires cueing >75% of the time  Expression   Expression assist level: Expresses basis less than 25% of the time/requires cueing >75% of the time.  Social Interaction Social Interaction assist level: Interacts appropriately less than 25% of the time. May be withdrawn or combative.  Problem  Solving Problem solving assist level: Solves basic less than 25% of the time - needs direction nearly all the time or does not effectively solve problems and may need a restraint for safety  Memory Memory assist level: Recognizes or recalls less than 25% of the time/requires cueing greater than 75% of the time    Pain Pain Assessment Pain Assessment: Faces Faces Pain Scale: No hurt  Therapy/Group: Individual Therapy  Rocky CraftsKara E Ritu Gagliardo 01/15/2016, 4:39 PM

## 2016-01-15 NOTE — IPOC Note (Signed)
Overall Plan of Care [facility](IPOC) Patient Details Name: Kayla SantiagoMary C Schroeder MRN: [id number] DOB: 1931-04-07  Admitting Diagnosis: CVA  Hospital Problems: Active Problems:   CVA (cerebral infarction)   Dementia   Left lower lobe pneumonia   Chronic renal insufficiency     Functional Problem List: Nursing Bladder, Bowel, Endurance, Medication Management, Safety, Skin Integrity  PT Balance, Endurance, Motor, Safety, Behavior, Sensory  OT Balance, Endurance, Motor, Perception, Sensory, Safety, Cognition  SLP Linguistic, Cognition  TR         Basic ADL's: OT Eating, Grooming, Bathing, Dressing, Toileting     Advanced  ADL's: OT       Transfers: PT Bed Mobility, Bed to Chair, Furniture, Set designerCar, Civil Service fast streamerl[place]  OT Toilet, Research scientist (life sciences)Tub/Shower     Locomotion: PT Ambulation, Psychologist, prison and probation servicesWheelchair Mobility, Stairs     Additional Impairments: OT Fuctional Use of Upper Extremity  SLP Communication, Social Cognition comprehension, expression Attention, Problem Solving, Awareness  TR      Anticipated Outcomes Item Anticipated Outcome  Self Feeding supervision  Swallowing      Basic self-care  min assist level  Toileting  min assist   Bathroom Transfers min assist   Bowel/Bladder  mod assist  Transfers  Min A   Locomotion  Min A with LRAD.   Communication  mod assist for basic   Cognition  mod assist for basic  Pain  pain less than or equal to 2  Safety/Judgment  min assist   Therapy Plan: PT Intensity: Minimum of 1-2 x/day ,45 to 90 minutes PT Frequency: 5 out of 7 days PT Duration Estimated Length of Stay: 14-18 days  OT Intensity: Minimum of 1-2 x/day, 45 to 90 minutes OT Frequency: 5 out of 7 days OT Duration/Estimated Length of Stay: 12-14 days SLP Intensity: Minumum of 1-2 x/day, 30 to 90 minutes SLP Frequency: 3 to 5 out of 7 days SLP Duration/Estimated Length of Stay: 14 days        Team Interventions: Nursing Interventions Patient/Family Education, Bladder Management, Medication  Management, Bowel Management  PT interventions Ambulation/gait training, Balance/vestibular training, Cognitive remediation/compensation, Community reintegration, Discharge planning, Disease management/prevention, DME/adaptive equipment instruction, Functional mobility training, Neuromuscular re-education, Pain management, Patient/family education, Psychosocial support, Stair training, Therapeutic Activities, Therapeutic Exercise, UE/LE Strength taining/ROM, UE/LE Coordination activities, Visual/perceptual remediation/compensation, Wheelchair propulsion/positioning  OT Interventions Warden/rangerBalance/vestibular training, Discharge planning, DME/adaptive equipment instruction, Functional mobility training, Patient/family education, Self Care/advanced ADL retraining, Therapeutic Exercise, UE/LE Strength taining/ROM, UE/LE Coordination activities, Therapeutic Activities, Neuromuscular re-education  SLP Interventions Cognitive remediation/compensation, Cueing hierarchy, Environmental controls, Functional tasks, Patient/family education, Internal/external aids  TR Interventions    SW/CM Interventions Discharge Planning, Psychosocial Support, Patient/Family Education    Team Discharge Planning: Destination: PT-Home ,OT- Home , SLP-Home Projected Follow-up: PT-Home health PT, OT-  24 hour supervision/assistance, SLP-24 hour supervision/assistance, Home Health SLP Projected Equipment Needs: PT-Rolling walker with 5" wheels, Wheelchair (measurements), Tub/shower bench, To be determined, OT- To be determined, SLP-  Equipment Details: PT- , OT-  Patient/family involved in discharge planning: PT- Family member/caregiver, Patient,  OT-Family member/caregiver, SLP-Family member/caregiver  MD ELOS: 12-14d Medical Rehab Prognosis:  Fair Assessment: 82 y.o. right handed female with history of hypertension, Alzheimer's disease, hyperlipidemia, esophageal stricture status post dilatation chronic renal insufficiency baseline  creatinine 1.08. History taken from chart review and family. Presented 01/07/2016 with acute onset of right-sided weakness. By report [facility] prior diagnosis of urinary tract infection placed on Cipro. Patient lives With daughter and who assists patient  as needed, but has a daughter who does assist with homemaking and meal preparation. She has a personal care attendant 3 times a week. She was independent with mobility without assistive device but assistance for safety was provided. Marland Kitchen MRI of the brain showed small to moderate volume of acute infarct high left frontal lobe predominantly involving the ACA territory. MRA with moderate to severe proximal left A2 ACA stenosis. Right ACA proximal A2 occlusion. Echocardiogram with ejection fraction of 70% grade 1 diastolic dysfunction. Patient did not receive TPA. Neurology consulted presently on aspirin for CVA prophylaxis   Now requiring 24/7 Rehab RN,MD, as well as CIR level PT, OT and SLP.  Treatment team will focus on ADLs and mobility with goals set at min / Mod A  See Team Conference Notes for weekly updates to the plan of care

## 2016-01-15 NOTE — Progress Notes (Signed)
Occupational Therapy Session Note  Patient Details  Name: Kayla SantiagoMary C Straka MRN: [id number] Date of Birth: 05/24/1931  Today's Date: 01/15/2016 OT Individual Time: [id number] OT Individual Time Calculation (min): 29 min    Skilled Therapeutic Interventions/Progress Updates:    Pt more responsive this session with laughing and smiling at times.  Rolled her down to the tub/shower room for practice stepping in and out of the tub as this was what she did at home.  Total assist +2 for stepping in the shower as she could grab the grab bars with the LUE and step over with the left foot but could not step over with the right without max facilitation.  She grasped the grab bar with the right hand after begin guided to it and needed max hand over hand to let it go.  Attempted stepping out of the tub but too unsafe to attempt so therapist assisted pt with swinging LEs out over the edge of the tub while sitting on small shower seat and then transferring to wheelchair with max assist.  Returned to room and had pt work on standing at the sink to clean of bowel incontinence and donn new brief.  She was able to stand with min assist with BUE support on the sink during 3-4 mins while therapist cleaned her, donned new brief, and helped pull up pants.  Pt assisted some with pulling up pants on the left side but still needed max assist.  Transferred to bed with max assist as well.  Pt with decreased ability to understand directions and turn around to sit on bed.  LEs crossed up requiring max assist to correct.  Max assist also needed for transition to supine.  Pt left in bed with call button and bed alarm in place.    Therapy Documentation Precautions:  Precautions Precautions: Fall Precaution Comments: hx of dementia, expressive delays, right in-attention and slight weakness Restrictions Weight Bearing Restrictions: No  Pain: Pain Assessment Pain Assessment: Faces Faces Pain Scale: Hurts a little bit Pain Location:  Generalized Pain Descriptors / Indicators: Grimacing Pain Onset: With Activity Pain Intervention(s): Repositioned;Emotional support ADL: See Function Navigator for Current Functional Status.   Therapy/Group: Individual Therapy  Velena Keegan OTR/L 01/15/2016, 4:56 PM

## 2016-01-16 ENCOUNTER — Inpatient Hospital Stay (HOSPITAL_COMMUNITY): Payer: Medicare Other | Admitting: Speech Pathology

## 2016-01-16 ENCOUNTER — Inpatient Hospital Stay (HOSPITAL_COMMUNITY): Payer: Medicare Other | Admitting: Physical Therapy

## 2016-01-16 ENCOUNTER — Inpatient Hospital Stay (HOSPITAL_COMMUNITY): Payer: Medicare Other | Admitting: Occupational Therapy

## 2016-01-16 NOTE — Progress Notes (Signed)
Physical Therapy Session Note  Patient Details  Name: Kayla SantiagoMary C Schroeder MRN: [id number] Date of Birth: Nov 02, 1930  Today's Date: 01/16/2016 PT Individual Time: 1415-1445 PT Individual Time Calculation (min): 30 min   Short Term Goals: Week 1:  PT Short Term Goal 1 (Week 1): Patient will be able to perform bed mobility with Mod A.  PT Short Term Goal 2 (Week 1): Patient will be able to ambulate 575ft with RW with mod A PT Short Term Goal 3 (Week 1): Patient will perform WC<>bed transfer with min A  PT Short Term Goal 4 (Week 1): Patient will be able to Ascend 1 step with mod A.   Skilled Therapeutic Interventions/Progress Updates:  Pt was seen bedside in the pm. Pt transported to rehab gym. Pt performed multiple sit to stand transfers with and without rolling walker, required mod to max A and verbal cues. Pt ambulated 25 feet x 4 with and without rolling walker, required mod to max A and verbal cues as well as physical assist. Following treatment pt returned to room and left sitting up in w/c with family at bedside.   Therapy Documentation Precautions:  Precautions Precautions: Fall Precaution Comments: hx of dementia, expressive delays, right in-attention and slight weakness Restrictions Weight Bearing Restrictions: No General:   Pain: No c/o pain.   See Function Navigator for Current Functional Status.   Therapy/Group: Individual Therapy  Rayford HalstedMitchell, Arlyce Circle G 01/16/2016, 3:45 PM

## 2016-01-16 NOTE — Progress Notes (Signed)
Occupational Therapy Session Note  Patient Details  Name: [name] MRN: [id number] Date of Birth: Sep 24, 1931  Today's Date: 01/16/2016 OT Individual Time: 0915-1000 OT Individual Time Calculation (min): 45 min    Short Term Goals: Week 1:  OT Short Term Goal 1 (Week 1): Pt will complete UB bathing with min assist and mod instructional cueing. OT Short Term Goal 2 (Week 1): Pt will donn pullover shirt with mod assist sitting EOB or EOC. OT Short Term Goal 3 (Week 1): Pt will complete shower transfer with min assist using RW stand pivot. OT Short Term Goal 4 (Week 1): Pt will donn pull up pants with min assist sit to stand and mod instructional cueing.    Skilled Therapeutic Interventions/Progress Updates:   Patient seen for ADL retraining and instruction this morning.  Patient's granddaughter present for session.  Patient supine in bed upon therapist arrival with increased lethargy.  Patient with minimal verbal output during session.  Patient transfers supine to sit EOB with max assist with use of rails and adjustable HOB.  Patient transfers EOB to wheelchair via stand pivot with max assist + 1.  Patient completes ADL wheelchair level at the sink.  Patient with increased fatigue, decreased initiation, processing, problem solving, organization, and sequencing noted during ADL bath.  Bathes with HOH assistance for washing 6/10 body parts; dependent for washing B lower legs/feet and peri area/buttocks in standing.  Sit <> stand with total assist and second person assist for washing peri area/buttocks.  Patient completes UB dress with max assist and LB dress dependent.  Grooms with HOH assist to comb hair and guidance to face to wash face; oral care not completed.  Patient left upright in wheelchair with quick release belt donned and granddaughter in room.    Granddaughter participated in hands on training with ADL retraining.  Therapist educated granddaughter on using simple phrases and allowing  increased time for processing with patient for improved understanding.  Also educated to only have one person talk at a time for patient to have opportunity for improved attention.  Therapy Documentation Precautions:  Precautions Precautions: Fall Precaution Comments: hx of dementia, expressive delays, right in-attention and slight weakness Restrictions Weight Bearing Restrictions: No Vital Signs: Therapy Vitals Pulse Rate: 93 BP: (!) 106/56 mmHg Patient Position (if appropriate): Lying Pain: Pain Assessment Pain Score: 0-No pain Pain Intervention(s): Ambulation/increased activity (Therapy to tolerance)  See Function Navigator for Current Functional Status.   Therapy/Group: Individual Therapy  Eber HongBrown, Maximino Cozzolino L 01/16/2016, 9:24 AM

## 2016-01-16 NOTE — Progress Notes (Signed)
Physical Therapy Note  Patient Details  Name: Kayla SantiagoMary C Schroeder MRN: [id number] Date of Birth: [birth year] Today's Date: 01/16/2016    Time: 1100-1154 54 minutes  1:1 No c/o pain. Pt asleep on PT arrival.  Required sternal rub to wake, requires PT using total A to come supine to sit for pt to open eyes and engage in treatment.  Pt requires increased time for initiation of sit to stand and max A to stand with RW for SPT to w/c.  Sit to stand x 3 from w/c with max A, max cuing and increased time for initiation.  Attempt to have patient engage in reaching task for sitting balance with 1 step commands to reach and place object.  Pt requires multimodal cues for attention and following 1 step commands for therapeutic task.  Newman PiesBall toss with pt able to react and catch ball 50% of the time. Pt requires max A to transfer back to w/c at end of session due to fatigue, decreased attention and motor planning.  Pt limited this session due to fatigue.   Preciliano Castell 01/16/2016, 12:32 PM

## 2016-01-16 NOTE — Progress Notes (Signed)
Speech Language Pathology Daily Session Note  Patient Details  Name: [name] MRN: [id number] Date of Birth: Oct 16, 1931  Today's Date: 01/16/2016 SLP Individual Time: [id number] SLP Individual Time Calculation (min): 45 min  Short Term Goals: Week 1: SLP Short Term Goal 1 (Week 1): Pt will follow 1 step commands during basic, familiar tasks with max assist multimodal cues.   SLP Short Term Goal 2 (Week 1): Pt will answer basic, biographical yes/no questions for >50% accuracy with max assist multimodal cues.  SLP Short Term Goal 3 (Week 1): Pt will sustain her attention to a basic familiar task for ~3 minute intervals with max assist verbal and tactile cues for redirection.   SLP Short Term Goal 4 (Week 1): Pt will complete a basic, familiar task with max assist verbal and tactile cues for initiation and sequencing.   SLP Short Term Goal 5 (Week 1): Pt will utilize call bell to convey needs/wants to staff members with max assist verbal and tactile cues.    Skilled Therapeutic Interventions: Pt positioned upright in bed. Initially sleeping, but easily alerted with verbal/tactile stimulation. Pt verbalized a few times intermittently throughout session in response to questions to social niceties. Pt assisted with breakfast tray. Oral management and phayrngeal response appeared much more efficient and effective this date as compared with previous. No oral holding or audible swallow noted. Pt required mod A of verbal and tactile cueing to initiate movement in a functional activity. No communication of preferences throughout session despite use of offering visual options and using yes/no questions.   Function:  Eating Eating   Modified Consistency Diet: No Eating Assist Level: Helper scoops food on utensil;Help with picking up utensils;Helper brings food to mouth   Eating Set Up Assist For: Opening containers Helper Scoops Food on Utensil: Every scoop Helper Brings Food to Mouth: Occasionally    Cognition Comprehension Comprehension assist level: Understands basic less than 25% of the time/ requires cueing >75% of the time  Expression   Expression assist level: Expresses basis less than 25% of the time/requires cueing >75% of the time.  Social Interaction Social Interaction assist level: Interacts appropriately less than 25% of the time. May be withdrawn or combative.  Problem Solving Problem solving assist level: Solves basic less than 25% of the time - needs direction nearly all the time or does not effectively solve problems and may need a restraint for safety  Memory Memory assist level: Recognizes or recalls less than 25% of the time/requires cueing greater than 75% of the time    Pain Pain Assessment Pain Assessment: Faces Faces Pain Scale: No hurt  Therapy/Group: Individual Therapy  Rocky CraftsKara E Harlan Vinal 01/16/2016, 5:33 PM

## 2016-01-16 NOTE — Progress Notes (Signed)
Subjective/Complaints: Pt asleep. Slow to arouse.  ROS- cannot obtain due to Mental status   Objective: Vital Signs: Blood pressure 106/56, pulse 93, temperature 97.8 F (36.6 C), temperature source Oral, resp. rate 18, SpO2 93 %. No results found. Results for orders placed or performed during the hospital encounter of 01/12/16 (from the past 72 hour(s))  Glucose, capillary     Status: Abnormal   Collection Time: 01/15/16  3:47 PM  Result Value Ref Range   Glucose-Capillary 123 (H) 65 - 99 mg/dL  Urinalysis, Routine w reflex microscopic (not at [facility])     Status: Abnormal   Collection Time: 01/15/16  4:26 PM  Result Value Ref Range   Color, Urine YELLOW YELLOW   APPearance CLEAR CLEAR   Specific Gravity, Urine 1.012 1.005 - 1.030   pH 6.0 5.0 - 8.0   Glucose, UA NEGATIVE NEGATIVE mg/dL   Hgb urine dipstick TRACE (A) NEGATIVE   Bilirubin Urine NEGATIVE NEGATIVE   Ketones, ur NEGATIVE NEGATIVE mg/dL   Protein, ur NEGATIVE NEGATIVE mg/dL   Nitrite NEGATIVE NEGATIVE   Leukocytes, UA NEGATIVE NEGATIVE  Urine microscopic-add on     Status: Abnormal   Collection Time: 01/15/16  4:26 PM  Result Value Ref Range   Squamous Epithelial / LPF 0-5 (A) NONE SEEN   WBC, UA 0-5 0 - 5 WBC/hpf   RBC / HPF 0-5 0 - 5 RBC/hpf   Bacteria, UA RARE (A) NONE SEEN   Casts HYALINE CASTS (A) NEGATIVE  CBC with Differential/Platelet     Status: Abnormal   Collection Time: 01/15/16  6:39 PM  Result Value Ref Range   WBC 12.5 (H) 4.0 - 10.5 K/uL   RBC 3.99 3.87 - 5.11 MIL/uL   Hemoglobin 12.0 12.0 - 15.0 g/dL   HCT 37.1 36.0 - 46.0 %   MCV 93.0 78.0 - 100.0 fL   MCH 30.1 26.0 - 34.0 pg   MCHC 32.3 30.0 - 36.0 g/dL   RDW 13.5 11.5 - 15.5 %   Platelets 317 150 - 400 K/uL   Neutrophils Relative % 74 %   Neutro Abs 9.2 (H) 1.7 - 7.7 K/uL   Lymphocytes Relative 14 %   Lymphs Abs 1.8 0.7 - 4.0 K/uL   Monocytes Relative 10 %   Monocytes Absolute 1.2 (H) 0.1 - 1.0 K/uL   Eosinophils Relative 1 %   Eosinophils Absolute 0.1 0.0 - 0.7 K/uL   Basophils Relative 1 %   Basophils Absolute 0.1 0.0 - 0.1 K/uL  Basic metabolic panel     Status: Abnormal   Collection Time: 01/15/16  6:39 PM  Result Value Ref Range   Sodium 140 135 - 145 mmol/L   Potassium 3.5 3.5 - 5.1 mmol/L   Chloride 105 101 - 111 mmol/L   CO2 22 22 - 32 mmol/L   Glucose, Bld 153 (H) 65 - 99 mg/dL   BUN 11 6 - 20 mg/dL   Creatinine, Ser 1.02 (H) 0.44 - 1.00 mg/dL   Calcium 8.6 (L) 8.9 - 10.3 mg/dL   GFR calc non Af Amer 49 (L) >60 mL/min   GFR calc Af Amer 57 (L) >60 mL/min    Comment: (NOTE) The eGFR has been calculated using the CKD EPI equation. This calculation has not been validated in all clinical situations. eGFR's persistently <60 mL/min signify possible Chronic Kidney Disease.    Anion gap 13 5 - 15     General: No acute distress Mood and affect are  appropriate Heart: Regular rate and rhythm no rubs murmurs or extra sounds Lungs: Clear to auscultation, breathing unlabored, no rales or wheezes Abdomen: Positive bowel sounds, soft nontender to palpation, nondistended Extremities: No clubbing, cyanosis, or edema Skin: No evidence of breakdown, no evidence of rash Neurologic: lethargic. Cranial nerves II through XII intact, motor strength is difficult to assess due to apraxia/arousal Sensory exam normal sensation to light touch and proprioception in bilateral upper and lower extremities Cerebellar exam normal finger to nose to finger as well as heel to shin in bilateral upper and lower extremities Musculoskeletal: Full range of motion in all 4 extremities. No joint swelling   Assessment/Plan: 1. Functional deficits secondary to Right hemiparesis due to Left post frontal infarct which require 3+ hours per day of interdisciplinary therapy in a comprehensive inpatient rehab setting. Physiatrist is providing close team supervision and 24 hour management of active medical problems listed below. Physiatrist and  rehab team continue to assess barriers to discharge/monitor patient progress toward functional and medical goals. FIM: Function - Bathing Position: Shower Body parts bathed by patient: Left upper leg, Left lower leg, Right arm Body parts bathed by helper: Right arm, Left arm, Chest, Abdomen, Front perineal area, Buttocks, Right upper leg, Left upper leg, Right lower leg, Left lower leg, Back Bathing not applicable: Chest, Abdomen Assist Level: Touching or steadying assistance(Pt > 75%)  Function- Upper Body Dressing/Undressing What is the patient wearing?: Button up shirt Bra - Perfomed by patient: Thread/unthread left bra strap Bra - Perfomed by helper: Thread/unthread right bra strap, Hook/unhook bra (pull down sports bra) Pull over shirt/dress - Perfomed by patient: Thread/unthread left sleeve Pull over shirt/dress - Perfomed by helper: Thread/unthread right sleeve, Put head through opening, Pull shirt over trunk Button up shirt - Perfomed by patient: Thread/unthread left sleeve Button up shirt - Perfomed by helper: Thread/unthread right sleeve, Thread/unthread left sleeve, Pull shirt around back, Button/unbutton shirt Assist Level: Touching or steadying assistance(Pt > 75%) Function - Lower Body Dressing/Undressing What is the patient wearing?: Pants, Maryln Manuel, Shoes Position: Education officer, museum at [employer]- Performed by patient: Thread/unthread left pants leg, Pull pants up/down Pants- Performed by helper: Thread/unthread right pants leg, Thread/unthread left pants leg, Pull pants up/down, Fasten/unfasten pants Non-skid slipper socks- Performed by helper: Don/doff right sock, Don/doff left sock Shoes - Performed by patient: Don/doff left shoe Shoes - Performed by helper: Don/doff right shoe TED Hose - Performed by helper: Don/doff right TED hose, Don/doff left TED hose  Function - Toileting Toileting activity did not occur: No continent bowel/bladder event Toileting steps completed  by helper: Adjust clothing prior to toileting, Performs perineal hygiene, Adjust clothing after toileting  Function - Air cabin crew transfer assistive device: Walker Assist level to toilet: Total assist (Pt < 25%) Assist level from toilet: Maximal assist (Pt 25 - 49%/lift and lower)  Function - Chair/bed transfer Chair/bed transfer assist level: Moderate assist (Pt 50 - 74%/lift or lower) Chair/bed transfer assistive device: Armrests, Walker Chair/bed transfer details: Verbal cues for sequencing, Verbal cues for technique, Verbal cues for precautions/safety, Visual cues/gestures for sequencing, Tactile cues for sequencing, Tactile cues for posture, Tactile cues for weight shifting, Manual facilitation for weight shifting, Manual facilitation for placement  Function - Locomotion: Wheelchair Will patient use wheelchair at discharge?: Yes Type: Manual Max wheelchair distance: 77f Assist Level: Maximal assistance (Pt 25 - 49%) Wheel 50 feet with 2 turns activity did not occur: Safety/medical concerns Wheel 150 feet activity did not occur: Safety/medical concerns Turns around,maneuvers  to table,bed, and toilet,negotiates 3% grade,maneuvers on rugs and over doorsills: No Function - Locomotion: Ambulation Assistive device: Walker-rolling Max distance: 30 Assist level: Moderate assist (Pt 50 - 74%) Assist level: Moderate assist (Pt 50 - 74%) Walk 50 feet with 2 turns activity did not occur: Safety/medical concerns Walk 150 feet activity did not occur: Safety/medical concerns Assist level: Moderate assist (Pt 50 - 74%)  Function - Comprehension Comprehension: Auditory Comprehension assist level: Understands basic less than 25% of the time/ requires cueing >75% of the time  Function - Expression Expression: Verbal Expression assist level: Expresses basis less than 25% of the time/requires cueing >75% of the time.  Function - Social Interaction Social Interaction assist level:  Interacts appropriately less than 25% of the time. May be withdrawn or combative.  Function - Problem Solving Problem solving assist level: Solves basic less than 25% of the time - needs direction nearly all the time or does not effectively solve problems and may need a restraint for safety  Function - Memory Memory assist level: Recognizes or recalls less than 25% of the time/requires cueing greater than 75% of the time Patient normally able to recall (first 3 days only): None of the above  Medical Problem List and Plan: 1.  Right sided weakness secondary to left posterior frontal lobe infarct- continue CIR. Slow progress               2.  DVT Prophylaxis/Anticoagulation: Subcutaneous Lovenox. Monitor platelet counts and any signs of bleeding, last plt on 3/15 was 285K 3. Pain Management: Tylenol as needed 4. Mood/dementia: Namenda 28 mg daily,Exelon patch, moderate dementia with acute worsening of cognition due to CVA 5. Neuropsych: This patient is not capable of making decisions on her own behalf. 6. Skin/Wound Care: Routine skin checks,7. Fluids/Electrolytes/Nutrition: Routine I&O with follow-up chemistries 8. Left lower lobe pneumonia. augmentin through 3/18, low grade temp,leukocytosis---monitor closely 9. Hypothyroidism. Synthroid. TSH 4.258, continue home dose of 0.23m 10. Chronic renal insufficiency. Baseline creatinine 1.08. Follow-up chemistries stable/normal 11. Hyperlipidemia. Lipitor 12.  Hypoalbuminemia - prostat  LOS (Days) 4 A FACE TO FACE EVALUATION WAS PERFORMED  Jackey Housey T 01/16/2016, 11:38 AM

## 2016-01-17 ENCOUNTER — Inpatient Hospital Stay (HOSPITAL_COMMUNITY): Payer: Medicare Other | Admitting: Physical Therapy

## 2016-01-17 LAB — URINE CULTURE: CULTURE: NO GROWTH

## 2016-01-17 NOTE — Progress Notes (Signed)
Subjective/Complaints: No new issues. Slow to arouse again for me today.   ROS- cannot obtain due to Mental status   Objective: Vital Signs: Blood pressure 111/57, pulse 82, temperature 98.5 F (36.9 C), temperature source Oral, resp. rate 18, SpO2 95 %. No results found. Results for orders placed or performed during the hospital encounter of 01/12/16 (from the past 72 hour(s))  Glucose, capillary     Status: Abnormal   Collection Time: 01/15/16  3:47 PM  Result Value Ref Range   Glucose-Capillary 123 (H) 65 - 99 mg/dL  Urinalysis, Routine w reflex microscopic (not at [facility])     Status: Abnormal   Collection Time: 01/15/16  4:26 PM  Result Value Ref Range   Color, Urine YELLOW YELLOW   APPearance CLEAR CLEAR   Specific Gravity, Urine 1.012 1.005 - 1.030   pH 6.0 5.0 - 8.0   Glucose, UA NEGATIVE NEGATIVE mg/dL   Hgb urine dipstick TRACE (A) NEGATIVE   Bilirubin Urine NEGATIVE NEGATIVE   Ketones, ur NEGATIVE NEGATIVE mg/dL   Protein, ur NEGATIVE NEGATIVE mg/dL   Nitrite NEGATIVE NEGATIVE   Leukocytes, UA NEGATIVE NEGATIVE  Culture, Urine     Status: None (Preliminary result)   Collection Time: 01/15/16  4:26 PM  Result Value Ref Range   Specimen Description URINE, CATHETERIZED    Special Requests NONE    Culture NO GROWTH < 24 HOURS    Report Status PENDING   Urine microscopic-add on     Status: Abnormal   Collection Time: 01/15/16  4:26 PM  Result Value Ref Range   Squamous Epithelial / LPF 0-5 (A) NONE SEEN   WBC, UA 0-5 0 - 5 WBC/hpf   RBC / HPF 0-5 0 - 5 RBC/hpf   Bacteria, UA RARE (A) NONE SEEN   Casts HYALINE CASTS (A) NEGATIVE  CBC with Differential/Platelet     Status: Abnormal   Collection Time: 01/15/16  6:39 PM  Result Value Ref Range   WBC 12.5 (H) 4.0 - 10.5 K/uL   RBC 3.99 3.87 - 5.11 MIL/uL   Hemoglobin 12.0 12.0 - 15.0 g/dL   HCT 37.1 36.0 - 46.0 %   MCV 93.0 78.0 - 100.0 fL   MCH 30.1 26.0 - 34.0 pg   MCHC 32.3 30.0 - 36.0 g/dL   RDW 13.5 11.5 -  15.5 %   Platelets 317 150 - 400 K/uL   Neutrophils Relative % 74 %   Neutro Abs 9.2 (H) 1.7 - 7.7 K/uL   Lymphocytes Relative 14 %   Lymphs Abs 1.8 0.7 - 4.0 K/uL   Monocytes Relative 10 %   Monocytes Absolute 1.2 (H) 0.1 - 1.0 K/uL   Eosinophils Relative 1 %   Eosinophils Absolute 0.1 0.0 - 0.7 K/uL   Basophils Relative 1 %   Basophils Absolute 0.1 0.0 - 0.1 K/uL  Basic metabolic panel     Status: Abnormal   Collection Time: 01/15/16  6:39 PM  Result Value Ref Range   Sodium 140 135 - 145 mmol/L   Potassium 3.5 3.5 - 5.1 mmol/L   Chloride 105 101 - 111 mmol/L   CO2 22 22 - 32 mmol/L   Glucose, Bld 153 (H) 65 - 99 mg/dL   BUN 11 6 - 20 mg/dL   Creatinine, Ser 1.02 (H) 0.44 - 1.00 mg/dL   Calcium 8.6 (L) 8.9 - 10.3 mg/dL   GFR calc non Af Amer 49 (L) >60 mL/min   GFR calc Af Wyvonnia Lora  57 (L) >60 mL/min    Comment: (NOTE) The eGFR has been calculated using the CKD EPI equation. This calculation has not been validated in all clinical situations. eGFR's persistently <60 mL/min signify possible Chronic Kidney Disease.    Anion gap 13 5 - 15     General: No acute distress Mood and affect are appropriate Heart: Regular rate and rhythm no rubs murmurs or extra sounds Lungs: Clear to auscultation, breathing unlabored, no rales or wheezes Abdomen: Positive bowel sounds, soft nontender to palpation, nondistended Extremities: No clubbing, cyanosis, or edema Skin: No evidence of breakdown, no evidence of rash Neurologic: lethargic. Cranial nerves II through XII intact, motor strength is difficult to assess due to apraxia/arousal Sensory exam normal sensation to light touch and proprioception in bilateral upper and lower extremities Cerebellar exam normal finger to nose to finger as well as heel to shin in bilateral upper and lower extremities Musculoskeletal: Full range of motion in all 4 extremities. No joint swelling   Assessment/Plan: 1. Functional deficits secondary to Right  hemiparesis due to Left post frontal infarct which require 3+ hours per day of interdisciplinary therapy in a comprehensive inpatient rehab setting. Physiatrist is providing close team supervision and 24 hour management of active medical problems listed below. Physiatrist and rehab team continue to assess barriers to discharge/monitor patient progress toward functional and medical goals. FIM: Function - Bathing Position: Wheelchair/chair at sink Body parts bathed by patient: Left arm, Chest, Abdomen Body parts bathed by helper: Right arm, Front perineal area, Buttocks, Right upper leg, Left upper leg, Right lower leg, Left lower leg, Back Bathing not applicable: Back Assist Level: 2 helpers  Function- Upper Body Dressing/Undressing What is the patient wearing?: Pull over shirt/dress Bra - Perfomed by patient: Thread/unthread left bra strap Bra - Perfomed by helper: Thread/unthread right bra strap, Hook/unhook bra (pull down sports bra) Pull over shirt/dress - Perfomed by patient: Thread/unthread left sleeve Pull over shirt/dress - Perfomed by helper: Thread/unthread right sleeve, Put head through opening, Pull shirt over trunk Button up shirt - Perfomed by patient: Thread/unthread left sleeve Button up shirt - Perfomed by helper: Thread/unthread right sleeve, Thread/unthread left sleeve, Pull shirt around back, Button/unbutton shirt Assist Level: Touching or steadying assistance(Pt > 75%) (max assist) Function - Lower Body Dressing/Undressing What is the patient wearing?: Pants, Non-skid slipper socks Position: Wheelchair/chair at sink Pants- Performed by patient: Thread/unthread left pants leg, Pull pants up/down Pants- Performed by helper: Thread/unthread right pants leg, Thread/unthread left pants leg, Pull pants up/down, Fasten/unfasten pants Non-skid slipper socks- Performed by helper: Don/doff right sock, Don/doff left sock Socks - Performed by helper: Don/doff right sock, Don/doff  left sock Shoes - Performed by patient: Don/doff left shoe Shoes - Performed by helper: Don/doff right shoe TED Hose - Performed by helper: Don/doff right TED hose, Don/doff left TED hose Assist for lower body dressing: 2 Helpers  Function - Toileting Toileting activity did not occur: No continent bowel/bladder event Toileting steps completed by helper: Adjust clothing prior to toileting, Performs perineal hygiene, Adjust clothing after toileting  Function - Air cabin crew transfer assistive device: Walker Assist level to toilet: Total assist (Pt < 25%) Assist level from toilet: Maximal assist (Pt 25 - 49%/lift and lower)  Function - Chair/bed transfer Chair/bed transfer assist level: Moderate assist (Pt 50 - 74%/lift or lower) Chair/bed transfer assistive device: Armrests, Walker Chair/bed transfer details: Verbal cues for sequencing, Verbal cues for technique, Verbal cues for precautions/safety, Visual cues/gestures for sequencing, Tactile cues for  sequencing, Tactile cues for posture, Tactile cues for weight shifting, Manual facilitation for weight shifting, Manual facilitation for placement  Function - Locomotion: Wheelchair Will patient use wheelchair at discharge?: Yes Type: Manual Max wheelchair distance: 34f Assist Level: Maximal assistance (Pt 25 - 49%) Wheel 50 feet with 2 turns activity did not occur: Safety/medical concerns Wheel 150 feet activity did not occur: Safety/medical concerns Turns around,maneuvers to table,bed, and toilet,negotiates 3% grade,maneuvers on rugs and over doorsills: No Function - Locomotion: Ambulation Assistive device: Hand held assist, Walker-rolling Max distance: 25 Assist level: Maximal assist (Pt 25 - 49%) Assist level: Maximal assist (Pt 25 - 49%) Walk 50 feet with 2 turns activity did not occur: Safety/medical concerns Walk 150 feet activity did not occur: Safety/medical concerns Assist level: Moderate assist (Pt 50 -  74%)  Function - Comprehension Comprehension: Auditory Comprehension assist level: Understands basic 25 - 49% of the time/ requires cueing 50 - 75% of the time  Function - Expression Expression: Verbal Expression assist level: Expresses basic 25 - 49% of the time/requires cueing 50 - 75% of the time. Uses single words/gestures.  Function - Social Interaction Social Interaction assist level: Interacts appropriately 25 - 49% of time - Needs frequent redirection.  Function - Problem Solving Problem solving assist level: Solves basic 25 - 49% of the time - needs direction more than half the time to initiate, plan or complete simple activities  Function - Memory Memory assist level: Recognizes or recalls 25 - 49% of the time/requires cueing 50 - 75% of the time Patient normally able to recall (first 3 days only): None of the above  Medical Problem List and Plan: 1.  Right sided weakness secondary to left posterior frontal lobe infarct- continue CIR. Slow progress to this point.               2.  DVT Prophylaxis/Anticoagulation: Subcutaneous Lovenox. Monitor platelet counts and any signs of bleeding, last plt on 3/15 was 285K 3. Pain Management: Tylenol as needed 4. Mood/dementia: Namenda 28 mg daily,Exelon patch, moderate dementia with acute worsening of cognition due to CVA 5. Neuropsych: This patient is not capable of making decisions on her own behalf. 6. Skin/Wound Care: Routine skin checks, 7. Fluids/Electrolytes/Nutrition: Routine I&O with follow-up chemistries 8. Left lower lobe pneumonia. augmentin completed 3/18, low grade temp (99.1),leukocytosis (12.5)---monitor closely  -recheck CBC tomorrow 9. Hypothyroidism. Synthroid. TSH 4.258, continue home dose of 0.241m10. Chronic renal insufficiency. Baseline creatinine 1.08. Follow-up chemistries stable/normal 11. Hyperlipidemia. Lipitor 12.  Hypoalbuminemia - prostat  LOS (Days) 5 A FACE TO FACE EVALUATION WAS  PERFORMED  Teasha Murrillo T 01/17/2016, 10:26 AM

## 2016-01-17 NOTE — Progress Notes (Signed)
Physical Therapy Session Note  Patient Details  Name: Kayla SantiagoMary C Wilsey MRN: [id number] Date of Birth: [birth year]  Today's Date: 01/17/2016 PT Individual Time: 1446-1531 PT Individual Time Calculation (min): 45 min   Short Term Goals: Week 1:  PT Short Term Goal 1 (Week 1): Patient will be able to perform bed mobility with Mod A.  PT Short Term Goal 2 (Week 1): Patient will be able to ambulate 1475ft with RW with mod A PT Short Term Goal 3 (Week 1): Patient will perform WC<>bed transfer with min A  PT Short Term Goal 4 (Week 1): Patient will be able to Ascend 1 step with mod A.   Skilled Therapeutic Interventions/Progress Updates:    Pt received in bed & agreeable to PT. Pt unable to correctly recall birthday on this date. Pt performed rolling L<>R with maximum verbal, visual, and tactile cuing for hand placement on bed rails and sequencing for rolling requiring max A. Pt initiated movement for supine>sit and bed positioned with HOB elevated & pt educated on hand placement on bed rails to help facilitate movement. Pt required Max A to transfer BLE off of bed, and supervision for trunk to transfer supine>sit. Once sitting at EOB pt required max A to scoot to EOB, pt had difficulty shifting weight anteriorly for movement. Pt required maximum tactile & verbal cuing for anterior weight shifts & hand placement on bed to transfer sit>stand with mod A. Pt ambulated 5 ft with RW, CGA & maximum cuing to ambulate in base of RW but pt unable to demonstrate this even with max A from PT to reposition RW. Pt with short step length BLE and would quit ambulating at times; [facility] and pt able to continue ambulating but with very short, shuffled steps (RLE worse than LLE). Pt transferred to sitting in w/c for rest break & required Max A to position self properly in w/c. Pt required max A for anterior weight shifts & hand placement on w/c, and Max A to transfer to standing when pt demonstrated posterior and right  lateral lean with max A from PT to recover. Pt ambulated back to bed, attempting to use RW but [facility]. Pt with total A to transfer sit>supine in bed. Pt reported she did not feel well but was not able to voice anything more; pt did not appear in visible distress. RN notified & pt left in care of RN at end of session.   Therapy Documentation Precautions:  Precautions Precautions: Fall Precaution Comments: hx of dementia, expressive delays, right in-attention and slight weakness Restrictions Weight Bearing Restrictions: No    See Function Navigator for Current Functional Status.   Therapy/Group: Individual Therapy  Sandi MariscalVictoria M Blasa Raisch 01/17/2016, 4:04 PM

## 2016-01-18 ENCOUNTER — Inpatient Hospital Stay (HOSPITAL_COMMUNITY): Payer: Medicare Other | Admitting: Speech Pathology

## 2016-01-18 ENCOUNTER — Inpatient Hospital Stay (HOSPITAL_COMMUNITY): Payer: Medicare Other | Admitting: Occupational Therapy

## 2016-01-18 ENCOUNTER — Inpatient Hospital Stay (HOSPITAL_COMMUNITY): Payer: Medicare Other | Admitting: Physical Therapy

## 2016-01-18 LAB — CBC
HEMATOCRIT: 36.4 % (ref 36.0–46.0)
HEMOGLOBIN: 12 g/dL (ref 12.0–15.0)
MCH: 30.4 pg (ref 26.0–34.0)
MCHC: 33 g/dL (ref 30.0–36.0)
MCV: 92.2 fL (ref 78.0–100.0)
Platelets: 349 10*3/uL (ref 150–400)
RBC: 3.95 MIL/uL (ref 3.87–5.11)
RDW: 13.4 % (ref 11.5–15.5)
WBC: 11.3 10*3/uL — AB (ref 4.0–10.5)

## 2016-01-18 NOTE — Progress Notes (Signed)
Subjective/Complaints: Daughter, son-in-law and friend in room today. Working with speech therapy. Discussed with speech therapy. Attention span is a few seconds. Patient cannot identify her caregiver by name. ROS- cannot obtain due to Mental status   Objective: Vital Signs: Blood pressure 121/62, pulse 73, temperature 98.3 F (36.8 C), temperature source Oral, resp. rate 20, SpO2 96 %. No results found. Results for orders placed or performed during the hospital encounter of 01/12/16 (from the past 72 hour(s))  Glucose, capillary     Status: Abnormal   Collection Time: 01/15/16  3:47 PM  Result Value Ref Range   Glucose-Capillary 123 (H) 65 - 99 mg/dL  Urinalysis, Routine w reflex microscopic (not at [facility])     Status: Abnormal   Collection Time: 01/15/16  4:26 PM  Result Value Ref Range   Color, Urine YELLOW YELLOW   APPearance CLEAR CLEAR   Specific Gravity, Urine 1.012 1.005 - 1.030   pH 6.0 5.0 - 8.0   Glucose, UA NEGATIVE NEGATIVE mg/dL   Hgb urine dipstick TRACE (A) NEGATIVE   Bilirubin Urine NEGATIVE NEGATIVE   Ketones, ur NEGATIVE NEGATIVE mg/dL   Protein, ur NEGATIVE NEGATIVE mg/dL   Nitrite NEGATIVE NEGATIVE   Leukocytes, UA NEGATIVE NEGATIVE  Culture, Urine     Status: None   Collection Time: 01/15/16  4:26 PM  Result Value Ref Range   Specimen Description URINE, CATHETERIZED    Special Requests NONE    Culture NO GROWTH 2 DAYS    Report Status 01/17/2016 FINAL   Urine microscopic-add on     Status: Abnormal   Collection Time: 01/15/16  4:26 PM  Result Value Ref Range   Squamous Epithelial / LPF 0-5 (A) NONE SEEN   WBC, UA 0-5 0 - 5 WBC/hpf   RBC / HPF 0-5 0 - 5 RBC/hpf   Bacteria, UA RARE (A) NONE SEEN   Casts HYALINE CASTS (A) NEGATIVE  CBC with Differential/Platelet     Status: Abnormal   Collection Time: 01/15/16  6:39 PM  Result Value Ref Range   WBC 12.5 (H) 4.0 - 10.5 K/uL   RBC 3.99 3.87 - 5.11 MIL/uL   Hemoglobin 12.0 12.0 - 15.0 g/dL   HCT  37.1 36.0 - 46.0 %   MCV 93.0 78.0 - 100.0 fL   MCH 30.1 26.0 - 34.0 pg   MCHC 32.3 30.0 - 36.0 g/dL   RDW 13.5 11.5 - 15.5 %   Platelets 317 150 - 400 K/uL   Neutrophils Relative % 74 %   Neutro Abs 9.2 (H) 1.7 - 7.7 K/uL   Lymphocytes Relative 14 %   Lymphs Abs 1.8 0.7 - 4.0 K/uL   Monocytes Relative 10 %   Monocytes Absolute 1.2 (H) 0.1 - 1.0 K/uL   Eosinophils Relative 1 %   Eosinophils Absolute 0.1 0.0 - 0.7 K/uL   Basophils Relative 1 %   Basophils Absolute 0.1 0.0 - 0.1 K/uL  Basic metabolic panel     Status: Abnormal   Collection Time: 01/15/16  6:39 PM  Result Value Ref Range   Sodium 140 135 - 145 mmol/L   Potassium 3.5 3.5 - 5.1 mmol/L   Chloride 105 101 - 111 mmol/L   CO2 22 22 - 32 mmol/L   Glucose, Bld 153 (H) 65 - 99 mg/dL   BUN 11 6 - 20 mg/dL   Creatinine, Ser 1.02 (H) 0.44 - 1.00 mg/dL   Calcium 8.6 (L) 8.9 - 10.3 mg/dL   GFR  calc non Af Amer 49 (L) >60 mL/min   GFR calc Af Amer 57 (L) >60 mL/min    Comment: (NOTE) The eGFR has been calculated using the CKD EPI equation. This calculation has not been validated in all clinical situations. eGFR's persistently <60 mL/min signify possible Chronic Kidney Disease.    Anion gap 13 5 - 15  CBC     Status: Abnormal   Collection Time: 01/18/16  6:34 AM  Result Value Ref Range   WBC 11.3 (H) 4.0 - 10.5 K/uL   RBC 3.95 3.87 - 5.11 MIL/uL   Hemoglobin 12.0 12.0 - 15.0 g/dL   HCT 36.4 36.0 - 46.0 %   MCV 92.2 78.0 - 100.0 fL   MCH 30.4 26.0 - 34.0 pg   MCHC 33.0 30.0 - 36.0 g/dL   RDW 13.4 11.5 - 15.5 %   Platelets 349 150 - 400 K/uL     General: No acute distress Mood and affect are appropriate Heart: Regular rate and rhythm no rubs murmurs or extra sounds Lungs: Clear to auscultation, breathing unlabored, no rales or wheezes Abdomen: Positive bowel sounds, soft nontender to palpation, nondistended Extremities: No clubbing, cyanosis, or edema Skin: No evidence of breakdown, no evidence of rash Neurologic:  lethargic. Cranial nerves II through XII intact, motor strength is difficult to assess due to apraxia/arousal Sensory exam normal sensation to light touch and proprioception in bilateral upper and lower extremities Cerebellar exam normal finger to nose to finger as well as heel to shin in bilateral upper and lower extremities Musculoskeletal: Full range of motion in all 4 extremities. No joint swelling   Assessment/Plan: 1. Functional deficits secondary to Right hemiparesis due to Left post frontal infarct which require 3+ hours per day of interdisciplinary therapy in a comprehensive inpatient rehab setting. Physiatrist is providing close team supervision and 24 hour management of active medical problems listed below. Physiatrist and rehab team continue to assess barriers to discharge/monitor patient progress toward functional and medical goals. FIM: Function - Bathing Position: Wheelchair/chair at sink Body parts bathed by patient: Left arm, Chest, Abdomen Body parts bathed by helper: Right arm, Front perineal area, Buttocks, Right upper leg, Left upper leg, Right lower leg, Left lower leg, Back Bathing not applicable: Back Assist Level: 2 helpers  Function- Upper Body Dressing/Undressing What is the patient wearing?: Pull over shirt/dress Bra - Perfomed by patient: Thread/unthread left bra strap Bra - Perfomed by helper: Thread/unthread right bra strap, Hook/unhook bra (pull down sports bra) Pull over shirt/dress - Perfomed by patient: Thread/unthread left sleeve Pull over shirt/dress - Perfomed by helper: Thread/unthread right sleeve, Put head through opening, Pull shirt over trunk Button up shirt - Perfomed by patient: Thread/unthread left sleeve Button up shirt - Perfomed by helper: Thread/unthread right sleeve, Thread/unthread left sleeve, Pull shirt around back, Button/unbutton shirt Assist Level: Touching or steadying assistance(Pt > 75%) (max assist) Function - Lower Body  Dressing/Undressing What is the patient wearing?: Pants, Non-skid slipper socks Position: Wheelchair/chair at sink Pants- Performed by patient: Thread/unthread left pants leg, Pull pants up/down Pants- Performed by helper: Thread/unthread right pants leg, Thread/unthread left pants leg, Pull pants up/down, Fasten/unfasten pants Non-skid slipper socks- Performed by helper: Don/doff right sock, Don/doff left sock Socks - Performed by helper: Don/doff right sock, Don/doff left sock Shoes - Performed by patient: Don/doff left shoe Shoes - Performed by helper: Don/doff right shoe TED Hose - Performed by helper: Don/doff right TED hose, Don/doff left TED hose Assist for lower body dressing:  2 Helpers  Function - Psychologist, occupational activity did not occur: No continent bowel/bladder event Toileting steps completed by helper: Adjust clothing prior to toileting, Performs perineal hygiene, Adjust clothing after toileting  Function - Toilet Transfers Toilet transfer assistive device: Walker Assist level to toilet: Total assist (Pt < 25%) Assist level from toilet: Maximal assist (Pt 25 - 49%/lift and lower)  Function - Chair/bed transfer Chair/bed transfer assist level: Moderate assist (Pt 50 - 74%/lift or lower) Chair/bed transfer assistive device: Armrests, Walker Chair/bed transfer details: Verbal cues for sequencing, Verbal cues for technique, Verbal cues for precautions/safety, Visual cues/gestures for sequencing, Tactile cues for sequencing, Tactile cues for posture, Tactile cues for weight shifting, Manual facilitation for weight shifting, Manual facilitation for placement  Function - Locomotion: Wheelchair Will patient use wheelchair at discharge?: Yes Type: Manual Max wheelchair distance: 60f Assist Level: Maximal assistance (Pt 25 - 49%) Wheel 50 feet with 2 turns activity did not occur: Safety/medical concerns Wheel 150 feet activity did not occur: Safety/medical concerns Turns  around,maneuvers to table,bed, and toilet,negotiates 3% grade,maneuvers on rugs and over doorsills: No Function - Locomotion: Ambulation Assistive device: Walker-rolling, Hand held assist Max distance: 10 ft Assist level: Touching or steadying assistance (Pt > 75%) Assist level: Touching or steadying assistance (Pt > 75%) Walk 50 feet with 2 turns activity did not occur: Safety/medical concerns Walk 150 feet activity did not occur: Safety/medical concerns Assist level: Moderate assist (Pt 50 - 74%)  Function - Comprehension Comprehension: Auditory Comprehension assist level: Understands basic 25 - 49% of the time/ requires cueing 50 - 75% of the time  Function - Expression Expression: Verbal Expression assist level: Expresses basic 25 - 49% of the time/requires cueing 50 - 75% of the time. Uses single words/gestures.  Function - Social Interaction Social Interaction assist level: Interacts appropriately 25 - 49% of time - Needs frequent redirection.  Function - Problem Solving Problem solving assist level: Solves basic 25 - 49% of the time - needs direction more than half the time to initiate, plan or complete simple activities  Function - Memory Memory assist level: Recognizes or recalls 25 - 49% of the time/requires cueing 50 - 75% of the time Patient normally able to recall (first 3 days only): None of the above  Medical Problem List and Plan: 1.  Right sided weakness secondary to left posterior frontal lobe infarct- continue CIR. Slow progress due to cognitive deficits from CVA superimposed on underlying dementia               2.  DVT Prophylaxis/Anticoagulation: Subcutaneous Lovenox. Monitor platelet counts and any signs of bleeding, last plt on 3/15 was 285K 3. Pain Management: Tylenol as needed 4. Mood/dementia: Namenda 28 mg daily,Exelon patch, moderate dementia with acute worsening of cognition due to CVA 5. Neuropsych: This patient is not capable of making decisions on her  own behalf. 6. Skin/Wound Care: Routine skin checks, 7. Fluids/Electrolytes/Nutrition: Routine I&O with follow-up chemistries 8. Left lower lobe pneumonia. augmentin completed 3/18, afebrile ),leukocytosis (11K), resolving  -personally reviewed CBC 9. Hypothyroidism. Synthroid. TSH 4.258, continue home dose of 0.282m10. Chronic renal insufficiency. Baseline creatinine 1.08. Follow-up chemistries stable/normal 11. Hyperlipidemia. Lipitor 12.  Hypoalbuminemia - prostat  LOS (Days) 6 A FACE TO FACE EVALUATION WAS PERFORMED  Apolonio Cutting E 01/18/2016, 8:34 AM

## 2016-01-18 NOTE — Progress Notes (Signed)
Occupational Therapy Session Note  Patient Details  Name: Kayla SantiagoMary C Schroeder MRN: [id number] Date of Birth: 1931/03/04  Today's Date: 01/18/2016 OT Individual Time: 1103-1200 OT Individual Time Calculation (min): 57 min    Short Term Goals: Week 1:  OT Short Term Goal 1 (Week 1): Pt will complete UB bathing with min assist and mod instructional cueing. OT Short Term Goal 2 (Week 1): Pt will donn pullover shirt with mod assist sitting EOB or EOC. OT Short Term Goal 3 (Week 1): Pt will complete shower transfer with min assist using RW stand pivot. OT Short Term Goal 4 (Week 1): Pt will donn pull up pants with min assist sit to stand and mod instructional cueing.    Skilled Therapeutic Interventions/Progress Updates:    [name] demonstrated severe initiation and cognitive deficits during session.  She would complete stand pivot transfers with mod assist if given something to reach for such as the grab bar or the sink but still needed mod facilitation at the hips to turn completely to sit on the toilet or on the shower bench.  She only washed a small part of her face with bathing and even when given hand over hand assistance.  Max assist overall to wash all other parts.  She was able to donn her shirt sleeve over the left arm and pull it over her head, but neglected to try and dress the RUE.  Total assist for pants however pt did attempt to assist with pulling them over her hips once she was standing.  Pt left in wheelchair with call button and phone in reach.  Safety belt in place as well.    Therapy Documentation Precautions:  Precautions Precautions: Fall Precaution Comments: hx of dementia, expressive delays, right in-attention and slight weakness Restrictions Weight Bearing Restrictions: No  Pain: Pain Assessment Pain Assessment: Faces Pain Score: 0-No pain Faces Pain Scale: No hurt ADL: See Function Navigator for Current Functional Status.   Therapy/Group: Individual  Therapy  Trevione Wert OTR/L 01/18/2016, 12:53 PM

## 2016-01-18 NOTE — Progress Notes (Signed)
Physical Therapy Session Note  Patient Details  Name: [name] MRN: [id number] Date of Birth: [birth year]  Today's Date: 01/18/2016 PT Individual Time: 1000-1100 PT Individual Time Calculation (min): 60 min   Short Term Goals: Week 1:  PT Short Term Goal 1 (Week 1): Patient will be able to perform bed mobility with Mod A.  PT Short Term Goal 2 (Week 1): Patient will be able to ambulate 2575ft with RW with mod A PT Short Term Goal 3 (Week 1): Patient will perform WC<>bed transfer with min A  PT Short Term Goal 4 (Week 1): Patient will be able to Ascend 1 step with mod A.   Skilled Therapeutic Interventions/Progress Updates:    Pt received supine in bed with family present, however left shortly after session started. Pt has no c/o pain or evidence of pain via FACES scale and agreeable to treatment. Supine>sit with HOB elevated and bedrails, mod A. Pt attempts to put on pants, however ultimately requires maxA for threading BLEs. Sit >stand with maxA, bedrails and increased time due to poor initiation, posterior trunk lean/pushing. Once standing, pt pulls up pants with S, modA for standing balance, then stand pivot to w/c with maxA. Sit <>stand several attempts with strong posterior lean/pushing; ultimately able to come to full stand with maxA. Gait x100' with +2 HHA, facilitation for weight shift and increased cadence. Standing balance with pt reaching for cups on L side for anterior/L lateral weight shift; standing balance improved from mod/maxA to standbyA when reaching for targets to reduce R posterolateral bias. Remained seated in w/c at completion of session with handoff to OT for next session.   Therapy Documentation Precautions:  Precautions Precautions: Fall Precaution Comments: hx of dementia, expressive delays, right in-attention and slight weakness Restrictions Weight Bearing Restrictions: No Pain: Pain Assessment Pain Assessment: Faces Faces Pain Scale: No hurt   See Function  Navigator for Current Functional Status.   Therapy/Group: Individual Therapy  Vista Lawmanlizabeth J Tygielski 01/18/2016, 3:59 PM

## 2016-01-18 NOTE — Progress Notes (Signed)
Occupational Therapy Session Note  Patient Details  Name: [name] MRN: [id number] Date of Birth: 1931/05/11  Today's Date: 01/18/2016 OT Individual Time: 1730-1800 OT Individual Time Calculation (min): 30 min    Short Term Goals: Week 1:  OT Short Term Goal 1 (Week 1): Pt will complete UB bathing with min assist and mod instructional cueing. OT Short Term Goal 2 (Week 1): Pt will donn pullover shirt with mod assist sitting EOB or EOC. OT Short Term Goal 3 (Week 1): Pt will complete shower transfer with min assist using RW stand pivot. OT Short Term Goal 4 (Week 1): Pt will donn pull up pants with min assist sit to stand and mod instructional cueing.    Skilled Therapeutic Interventions/Progress Updates: Patient confused and disoriented but when asked if she wanted to try to use the bathroom, she replied, "yes."  She was able to participate in skilled OT as follows:   Supine in bed to Edge of bed transfer= Mod A and min tactile and min verbal cues.    Bed to/fr wheel chair transfer = mod assist stand pivot  With extra time to process instructions and participate;   Wheel chair to toilet transfer via grab bar = Mod A squat pivot (pulling on bar); dependent for clothing management  Patient was assisted back to bed and call bell and bed alarm in place.   This clinician attempted to train patient in using call bell, but she did not return demonstration.     Continue with primary therapist or other representative  next session     Therapy Documentation Precautions:  Precautions Precautions: Fall Precaution Comments: hx of dementia, expressive delays, right in-attention and slight weakness Restrictions Weight Bearing Restrictions: No    Pain:asked but this patient did not reply.   No expressions on her face or grimaces that might have indicated pain Pain Assessment Pain Assessment: Faces Faces Pain Scale: No hurt   See Function Navigator for Current Functional  Status.   Therapy/Group: Individual Therapy  Bud Faceickett, Tyishia Aune [facility] 01/18/2016, 7:02 PM

## 2016-01-18 NOTE — Progress Notes (Signed)
Speech Language Pathology Daily Session Note  Patient Details  Name: [name] MRN: [id number] Date of Birth: 27-Sep-1931  Today's Date: 01/18/2016 SLP Individual Time: 0800-0900 SLP Individual Time Calculation (min): 60 min  Short Term Goals: Week 1: SLP Short Term Goal 1 (Week 1): Pt will follow 1 step commands during basic, familiar tasks with max assist multimodal cues.   SLP Short Term Goal 2 (Week 1): Pt will answer basic, biographical yes/no questions for >50% accuracy with max assist multimodal cues.  SLP Short Term Goal 3 (Week 1): Pt will sustain her attention to a basic familiar task for ~3 minute intervals with max assist verbal and tactile cues for redirection.   SLP Short Term Goal 4 (Week 1): Pt will complete a basic, familiar task with max assist verbal and tactile cues for initiation and sequencing.   SLP Short Term Goal 5 (Week 1): Pt will utilize call bell to convey needs/wants to staff members with max assist verbal and tactile cues.    Skilled Therapeutic Interventions:  Pt was seen for skilled ST targeting cognitive goals.  Pt was able to sustain her attention to self feeding tasks for ~30 second intervals with mod-max assist verbal cues for redirection.  Max assist verbal cues were needed for initiation and sequencing during forced use of right upper extremity whereas if pt was allowed to use fingers of left hand she was able to initiate and sequence self feeding with min assist verbal cues.  Pt able to follow 1-step commands with mod assist verbal, visual, and tactile cues for >50% accuracy.  Pt with minimal responses to SLP's questions due to decreased initiation, even when questions were modified to offer pt choice of two items only.  Educated family about cognitive strategies to maximize pt's functional independence with tasks, including repetition of directions and allowing pt extra time to respond and complete tasks.  Pt's family verbalized understanding.  Pt able to  return demonstration for use of call bell with max assist verbal and visual cues.  Pt left in bed with family at bedside.  Bed alarm set and call bell left within reach.    Continue per current plan of care.    Function:  Eating Eating     Eating Assist Level: Help with picking up utensils;Help managing cup/glass;Supervision or verbal cues;Set up assist for;Hand over hand assist   Eating Set Up Assist For: Opening containers;Cutting food;Applying device (includes dentures)       Cognition Comprehension Comprehension assist level: Understands basic 25 - 49% of the time/ requires cueing 50 - 75% of the time  Expression   Expression assist level: Expresses basic 25 - 49% of the time/requires cueing 50 - 75% of the time. Uses single words/gestures.  Social Interaction Social Interaction assist level: Interacts appropriately 25 - 49% of time - Needs frequent redirection.  Problem Solving Problem solving assist level: Solves basic 25 - 49% of the time - needs direction more than half the time to initiate, plan or complete simple activities  Memory Memory assist level: Recognizes or recalls less than 25% of the time/requires cueing greater than 75% of the time    Pain Pain Assessment Pain Assessment: Faces Pain Score: 0-No pain Faces Pain Scale: No hurt  Therapy/Group: Individual Therapy  Zebulen Simonis, Melanee SpryNicole L 01/18/2016, 12:27 PM

## 2016-01-19 ENCOUNTER — Inpatient Hospital Stay (HOSPITAL_COMMUNITY): Payer: Medicare Other | Admitting: Occupational Therapy

## 2016-01-19 ENCOUNTER — Inpatient Hospital Stay (HOSPITAL_COMMUNITY): Payer: Medicare Other | Admitting: Speech Pathology

## 2016-01-19 ENCOUNTER — Inpatient Hospital Stay (HOSPITAL_COMMUNITY): Payer: Medicare Other | Admitting: Physical Therapy

## 2016-01-19 NOTE — Progress Notes (Signed)
Speech Language Pathology Daily Session Note  Patient Details  Name: Lawrence SantiagoMary C Halfmann MRN: [id number] Date of Birth: 11-12-1930  Today's Date: 01/19/2016 SLP Individual Time: 1405-1500 SLP Individual Time Calculation (min): 55 min  Short Term Goals: Week 1: SLP Short Term Goal 1 (Week 1): Pt will follow 1 step commands during basic, familiar tasks with max assist multimodal cues.   SLP Short Term Goal 2 (Week 1): Pt will answer basic, biographical yes/no questions for >50% accuracy with max assist multimodal cues.  SLP Short Term Goal 3 (Week 1): Pt will sustain her attention to a basic familiar task for ~3 minute intervals with max assist verbal and tactile cues for redirection.   SLP Short Term Goal 4 (Week 1): Pt will complete a basic, familiar task with max assist verbal and tactile cues for initiation and sequencing.   SLP Short Term Goal 5 (Week 1): Pt will utilize call bell to convey needs/wants to staff members with max assist verbal and tactile cues.    Skilled Therapeutic Interventions:  Pt was seen for skilled ST targeting cognitive goals.  Pt followed 1-step directions during a basic, familiar task with hand over hand assist for initiation and sequencing and was only able to focus her attention to task for ~10 second intervals before requiring max assist verbal and tactile cues for redirection.  Slightly improved initiation noted during a highly structured basic sorting task.  Pt was able to sustain her attention to task for 15-30 seconds with max assist verbal and tactile cues for redirection; max assist verbal and visual cues needed for initiation to transfer colored blocks from one container into another.  Pt's only verbal responses today were yes/no, although some spontaneous singing (<10% of opportunities) was noted while pt was listening to familiar songs.  Pt was returned to room and left with family at bedside.  Continue per current plan of care.    Function:  Eating Eating                  Cognition Comprehension Comprehension assist level: Understands basic 25 - 49% of the time/ requires cueing 50 - 75% of the time  Expression   Expression assist level: Expresses basic 25 - 49% of the time/requires cueing 50 - 75% of the time. Uses single words/gestures.  Social Interaction Social Interaction assist level: Interacts appropriately less than 25% of the time. May be withdrawn or combative.  Problem Solving Problem solving assist level: Solves basic less than 25% of the time - needs direction nearly all the time or does not effectively solve problems and may need a restraint for safety  Memory Memory assist level: Recognizes or recalls less than 25% of the time/requires cueing greater than 75% of the time    Pain Pain Assessment Pain Assessment: No/denies pain  Therapy/Group: Individual Therapy  Deigo Alonso, Melanee SpryNicole L 01/19/2016, 4:02 PM

## 2016-01-19 NOTE — Progress Notes (Signed)
Occupational Therapy Weekly Progress Note  Patient Details  Name: [name] MRN: [id number] Date of Birth: Aug 05, 1931  Beginning of progress report period: January 13, 2016 End of progress report period: January 19, 2016  Today's Date: 01/19/2016 OT Individual Time: 0900-1000 OT Individual Time Calculation (min): 60 min    Patient has met 1 of 4 short term goals.  Mrs. Hillenburg continues to demonstrate fluctuating progress and performance of self care tasks during OT sessions.  At times she needs max assist for initiation of sit to stand and other times she can complete it with supervision.  She is able to wash her face when presented with a washcloth but need max hand over hand to progress and wash other parts of her body.  She demonstrates decreased functional use and integration of the RUE as well.  She will use it with holding the washcloth at times and for more automatic tasks such as pulling up pants, but this still is not consistent.  For dressing she will lift the LLE and place it in the pants legs but needs max assist to place the right.  With donning of shirt she places the LUE in and then attempts to donn it over her head without any awareness or attempt that the RUE needs to go in as well.  Transfers are min to max assist as well because at times she does not understand and will then not move her LEs to take steps unless max assist has given.  Feel she will continue to demonstrate fluctuating progress secondary to dementia and new left CVA.  Will continue with current OT treatment plan of care with goals set at min assist level.  May need to downgrade if progress remains slow.    Patient continues to demonstrate the following deficits: decreased balance, decreased initiation, decreased balance, right inattention, and therefore will continue to benefit from skilled OT intervention to enhance overall performance with BADL.  Patient progressing toward long term goals..  Continue plan of  care.  OT Short Term Goals Week 2:  OT Short Term Goal 1 (Week 2): Continue working on established LTGs set mostly at min assist level.  Skilled Therapeutic Interventions/Progress Updates:   Pt worked on bathing and dressing during session.  Max assist for bathing tasks with pt only washing her face and part of her upper legs to cueing.  She would not wash other parts unless given max hand over hand and then the thoroughness was minimal.  She did do better with dressing tasks in that when presented with a pullover shirt she was able to thread the left arm and then attempted to pull over head.  Mod assist with mod demonstrational cueing allowed her to place the RUE in the correct sleeve before she finally placed it over her head and then pulled it down over her trunk.  She would place the LLE in her brief and pants but did not attempt the RLE.  May be secondary to motor impairment as well as decreased attention.  She donned her left shoe but needed max assist with the right.  Mod assist for thoroughness for combing her hair.  Once she finished, placed her in the wheelchair with safety belt in place and call button in reach.    Therapy Documentation Precautions:  Precautions Precautions: Fall Precaution Comments: hx of dementia, expressive delays, right in-attention and slight weakness Restrictions Weight Bearing Restrictions: No  Pain: Pain Assessment Pain Assessment: No/denies pain ADL: See Function Navigator for  Current Functional Status.   Therapy/Group: Individual Therapy  Ninamarie Keel OTR/L 01/19/2016, 11:47 AM

## 2016-01-19 NOTE — Progress Notes (Signed)
Physical Therapy Note  Patient Details  Name: Kayla SantiagoMary C Schroeder MRN: [id number] Date of Birth: [birth year] Today's Date: 01/19/2016    Time: 1100-1130 30 minutes  1:1 no c/o pain. Pt performed stair negotiation x 8 stairs with B handrails with mod cuing and assist for advancing R UE, min cuing for initiation, max multimodal cues for pt to place R LE fully on step before stepping with L LE. Pt demos improved initiation with this functional, basic task.  Standing clothespin task with focus on pt following 1 step command to place clothespin on bar. Pt requires max/total hand over hand assist to perform despite repetition and attempted elimination of distractions. Pt able to stand 2 x 4 minutes while attempting task. Pt overall min A for standing balance, max A for following 1 step commands.  Pt continues to be limited by cognitive deficits, decreased attention, awareness and initiation.   Jaret Coppedge 01/19/2016, 11:30 AM

## 2016-01-19 NOTE — Progress Notes (Signed)
Physical Therapy Session Note  Patient Details  Name: Lawrence SantiagoMary C Fitzpatrick MRN: [id number] Date of Birth: 1931/06/20  Today's Date: 01/19/2016 PT Individual Time: [id number] PT Individual Time Calculation (min): 40 min   Short Term Goals: Week 1:  PT Short Term Goal 1 (Week 1): Patient will be able to perform bed mobility with Mod A.  PT Short Term Goal 2 (Week 1): Patient will be able to ambulate 3775ft with RW with mod A PT Short Term Goal 3 (Week 1): Patient will perform WC<>bed transfer with min A  PT Short Term Goal 4 (Week 1): Patient will be able to Ascend 1 step with mod A.   Skilled Therapeutic Interventions/Progress Updates:    Pt received in w/c with family present. Daughter, [name], reports she plans to move in with pt upon d/c. PT performed family education on pt's need for significant assistance. Pt transferred sit>stand with mod A & maximum verbal/visual/tactile cuing for hand placement on armrests of w/c to push up. Pt with decreased anterior weight shift during transfer. Pt ambulated with HHA (+2 at times with assist from daughter) with decreased step length RLE, decreased weight shift to right, and decreased R foot clearance. Pt ambulated 25 ft and ambulated into bathroom, requiring maximum assistance for positioning by toilet. Pt required total A for doffing, and later donning pants & brief, and maximum tactile cuing for stand<>sit. Pt also required total A for hygiene assistance. Pt ambulated to sink and required maximum cuing for sequencing for handwashing and to wash RUE. PT educated pt's daughter on pt's decreased awareness of RUE & RLE. After seated rest break pt ambulated additional 15 ft with HHA. At end of session pt left in w/c with QRB in place, all needs within reach, and family present to supervise. Pt nonverbal throughout PT session, only reporting she was not in any pain. PT educated pt's family on potential of this being pt's new baseline level of function.   Therapy  Documentation Precautions:  Precautions Precautions: Fall Precaution Comments: hx of dementia, expressive delays, right in-attention and slight weakness Restrictions Weight Bearing Restrictions: No   See Function Navigator for Current Functional Status.   Therapy/Group: Individual Therapy  Sandi MariscalVictoria M Khloei Spiker 01/19/2016, 2:00 PM

## 2016-01-19 NOTE — Progress Notes (Signed)
Subjective/Complaints: .No issues overnite per CNA Pt confused but brighter this am, can say hellow but will not answer questions ROS- cannot obtain due to Mental status   Objective: Vital Signs: Blood pressure 122/64, pulse 81, temperature 98.9 F (37.2 C), temperature source Oral, resp. rate 18, SpO2 95 %. No results found. Results for orders placed or performed during the hospital encounter of 01/12/16 (from the past 72 hour(s))  CBC     Status: Abnormal   Collection Time: 01/18/16  6:34 AM  Result Value Ref Range   WBC 11.3 (H) 4.0 - 10.5 K/uL   RBC 3.95 3.87 - 5.11 MIL/uL   Hemoglobin 12.0 12.0 - 15.0 g/dL   HCT 78.236.4 95.636.0 - 21.346.0 %   MCV 92.2 78.0 - 100.0 fL   MCH 30.4 26.0 - 34.0 pg   MCHC 33.0 30.0 - 36.0 g/dL   RDW 08.613.4 57.811.5 - 46.915.5 %   Platelets 349 150 - 400 K/uL     General: No acute distress Mood and affect are appropriate Heart: Regular rate and rhythm no rubs murmurs or extra sounds Lungs: Clear to auscultation, breathing unlabored, no rales or wheezes Abdomen: Positive bowel sounds, soft nontender to palpation, nondistended Extremities: No clubbing, cyanosis, or edema Skin: No evidence of breakdown, no evidence of rash Neurologic: lethargic. Cranial nerves II through XII intact, motor strength is difficult to assess due to apraxia/arousal Sensory exam normal sensation to light touch and proprioception in bilateral upper and lower extremities Cerebellar exam normal finger to nose to finger as well as heel to shin in bilateral upper and lower extremities Musculoskeletal: Full range of motion in all 4 extremities. No joint swelling   Assessment/Plan: 1. Functional deficits secondary to Right hemiparesis due to Left post frontal infarct which require 3+ hours per day of interdisciplinary therapy in a comprehensive inpatient rehab setting. Physiatrist is providing close team supervision and 24 hour management of active medical problems listed below. Physiatrist  and rehab team continue to assess barriers to discharge/monitor patient progress toward functional and medical goals. FIM: Function - Bathing Position: Shower Body parts bathed by patient: Left arm, Chest, Abdomen Body parts bathed by helper: Right arm, Front perineal area, Buttocks, Right upper leg, Left upper leg, Right lower leg, Left lower leg, Back, Left arm, Chest, Abdomen Bathing not applicable: Back Assist Level: 2 helpers  Function- Upper Body Dressing/Undressing What is the patient wearing?: Pull over shirt/dress Bra - Perfomed by patient: Thread/unthread left bra strap Bra - Perfomed by helper: Thread/unthread right bra strap, Hook/unhook bra (pull down sports bra) Pull over shirt/dress - Perfomed by patient: Thread/unthread left sleeve, Put head through opening Pull over shirt/dress - Perfomed by helper: Pull shirt over trunk, Thread/unthread right sleeve Button up shirt - Perfomed by patient: Thread/unthread left sleeve Button up shirt - Perfomed by helper: Thread/unthread right sleeve, Thread/unthread left sleeve, Pull shirt around back, Button/unbutton shirt Assist Level: Touching or steadying assistance(Pt > 75%) (max assist) Function - Lower Body Dressing/Undressing What is the patient wearing?: Pants, Shoes, Ted Hose Position: Wheelchair/chair at [employer] Pants- Performed by patient: Thread/unthread left pants leg, Pull pants up/down Pants- Performed by helper: Pull pants up/down, Thread/unthread left pants leg, Thread/unthread right pants leg Non-skid slipper socks- Performed by helper: Don/doff right sock, Don/doff left sock Socks - Performed by helper: Don/doff right sock, Don/doff left sock Shoes - Performed by patient: Don/doff left shoe Shoes - Performed by helper: Don/doff right shoe TED Hose - Performed by helper: Don/doff right TED  hose, Don/doff left TED hose Assist for lower body dressing: 2 Helpers  Function - Toileting Toileting activity did not occur: No  continent bowel/bladder event Toileting steps completed by helper: Adjust clothing prior to toileting, Performs perineal hygiene, Adjust clothing after toileting  Function - Toilet Transfers Toilet transfer activity did not occur: Safety/medical concerns Toilet transfer assistive device: Walker Assist level to toilet: Total assist (Pt < 25%) Assist level from toilet: Maximal assist (Pt 25 - 49%/lift and lower)  Function - Chair/bed transfer Chair/bed transfer method: Stand pivot Chair/bed transfer assist level: Maximal assist (Pt 25 - 49%/lift and lower) Chair/bed transfer assistive device: Armrests Chair/bed transfer details: Visual cues/gestures for sequencing, Tactile cues for sequencing, Tactile cues for posture, Tactile cues for weight shifting, Manual facilitation for weight shifting, Verbal cues for sequencing  Function - Locomotion: Wheelchair Will patient use wheelchair at discharge?: Yes Type: Manual Max wheelchair distance: 59ft Assist Level: Maximal assistance (Pt 25 - 49%) Wheel 50 feet with 2 turns activity did not occur: Safety/medical concerns Wheel 150 feet activity did not occur: Safety/medical concerns Turns around,maneuvers to table,bed, and toilet,negotiates 3% grade,maneuvers on rugs and over doorsills: No Function - Locomotion: Ambulation Assistive device: Hand held assist Max distance: 100 Assist level: 2 helpers Assist level: Moderate assist (Pt 50 - 74%) Walk 50 feet with 2 turns activity did not occur: Safety/medical concerns Assist level: 2 helpers Walk 150 feet activity did not occur: Safety/medical concerns Assist level: Moderate assist (Pt 50 - 74%)  Function - Comprehension Comprehension: Auditory Comprehension assist level: Understands basic 25 - 49% of the time/ requires cueing 50 - 75% of the time  Function - Expression Expression: Verbal Expression assist level: Expresses basic 25 - 49% of the time/requires cueing 50 - 75% of the time. Uses  single words/gestures.  Function - Social Interaction Social Interaction assist level: Interacts appropriately less than 25% of the time. May be withdrawn or combative.  Function - Problem Solving Problem solving assist level: Solves basic less than 25% of the time - needs direction nearly all the time or does not effectively solve problems and may need a restraint for safety  Function - Memory Memory assist level: Recognizes or recalls less than 25% of the time/requires cueing greater than 75% of the time Patient normally able to recall (first 3 days only): None of the above  Medical Problem List and Plan: 1.  Right sided weakness secondary to left posterior frontal lobe infarct-team conf in am              2.  DVT Prophylaxis/Anticoagulation: Subcutaneous Lovenox. Monitor platelet counts and any signs of bleeding, last plt on 3/15 was 285K 3. Pain Management: Tylenol as needed 4. Mood/dementia: Namenda 28 mg daily,Exelon patch, moderate dementia with acute worsening of cognition due to CVA 5. Neuropsych: This patient is not capable of making decisions on her own behalf. 6. Skin/Wound Care: Routine skin checks, 7. Fluids/Electrolytes/Nutrition: Routine I&O with follow-up chemistries, taking 90-100% meals  9. Hypothyroidism. Synthroid. TSH 4.258, continue home dose of 0.25mg  10. Chronic renal insufficiency. Baseline creatinine 1.08. Follow-up chemistries stable/normal 11. Hyperlipidemia. Lipitor 12.  Hypoalbuminemia - prostat, good meal intake  LOS (Days) 7 A FACE TO FACE EVALUATION WAS PERFORMED  KIRSTEINS,ANDREW E 01/19/2016, 7:56 AM

## 2016-01-19 NOTE — Progress Notes (Signed)
Social Work Patient ID: [name], female   DOB: [birth year], 82 y.o.   MRN: [id number] Met with pt and daughter who was here visiting to inform have contacted aide services and they will only hold her spot for two weeks and daughter informed they only Plan to provide one day of assist but will need to evaluate her once home. She had been getting three days a week. Daughter is hoping the person who lives with her Can assist but she has trouble walking herself. Will meet with tomorrow and discuss goals and team conference. Daughter has multiple MD appointments between she and her Husband and will not be able to provide 24 hr care to pt at discharge.

## 2016-01-20 ENCOUNTER — Inpatient Hospital Stay (HOSPITAL_COMMUNITY): Payer: Medicare Other | Admitting: Occupational Therapy

## 2016-01-20 ENCOUNTER — Inpatient Hospital Stay (HOSPITAL_COMMUNITY): Payer: Medicare Other | Admitting: Speech Pathology

## 2016-01-20 ENCOUNTER — Inpatient Hospital Stay (HOSPITAL_COMMUNITY): Payer: Medicare Other | Admitting: Physical Therapy

## 2016-01-20 NOTE — Patient Care Conference (Signed)
Inpatient RehabilitationTeam Conference and Plan of Care Update Date: 01/20/2016   Time: 10:45 AM    Patient Name: [name]      Medical Record Number: [id number]  Date of Birth: Dec 17, 1930 Sex: Female         Room/Bed: 4W11C/4W11C-01 Payor Info: Payor: [employer] MEDICARE / [facility] / Product Type: *No Product type* /    Admitting Diagnosis: CVA  Admit Date/Time:  01/12/2016  3:19 PM Admission Comments: No comment available   Primary Diagnosis:  <principal problem not specified> Principal Problem: <principal problem not specified>  Patient Active Problem List   Diagnosis Date Noted  . Aspiration pneumonia (HCC) 01/12/2016  . Dementia without behavioral disturbance 01/12/2016  . CVA (cerebral infarction) 01/12/2016  . Dementia   . Left lower lobe pneumonia   . Chronic renal insufficiency   . Cerebrovascular accident (CVA) due to thrombosis of left carotid artery (HCC)   . Hyperlipidemia   . Thyroid activity decreased   . Esophageal stricture   . Leukocytosis   . Intracranial vascular stenosis   . Acute embolic stroke (HCC)   . UTI (lower urinary tract infection) 01/07/2016  . Alzheimer's dementia 03/26/2015  . GERD (gastroesophageal reflux disease) 03/26/2015  . HTN (hypertension) 03/26/2015  . CKD (chronic kidney disease), stage III 03/26/2015  . S/P cholecystectomy 03/26/2015    Expected Discharge Date: Expected Discharge Date: 01/23/16  Team Members Present: Physician leading conference: Dr. Claudette LawsAndrew Kirsteins Social Worker Present: Dossie DerBecky Ellan Tess, LCSW Nurse Present: Kennyth ArnoldStacey Jennings, RN PT Present: Katherine Mantleodney Wishart, PT OT Present: Perrin MalteseJames McGuire, OT SLP Present: Jackalyn LombardNicole Page, SLP PPS Coordinator present : Tora DuckMarie Noel, RN, CRRN     Current Status/Progress Goal Weekly Team Focus  Medical   limited improvement in speech,very limited initiation  Home d/c  D/C planning   Bowel/Bladder   incont x2 LBM 01/20/16  cont of bowel and bladder  continue to  monitor patient's progress   Swallow/Nutrition/ Hydration             ADL's   currently max assist bathing, mod to max dressing sit to stand.  Min to max assist for transfers.  Still with decreased initiation to tasks overall.  Motor planning deficits and decreased attention to the RUE and RLE as well  supervision to min assist level  selfcare re-training, transfer training, balance re-training, RUE functional use, family education.   Mobility   max A for all mobility due to decreased initiation and motor planning deficits  min A  initiation, attention, motor planning, activity tolerance   Communication   minimal verbalization, poor initiation and significant motor planning impairment   max assist downgraded   answering yes/no questions, following 1-step commands, use of call bell    Safety/Cognition/ Behavioral Observations  very poor initiation, poor seqeuncing and task organization, perseveration, limited progress   max assist downgraded   basic cognition    Pain   no complaints or signs of pain  <2 on 0-10 pain scale  Continue to monitor patient for complaints/signs of pain   Skin   rash on perineum; MGP/barrier cream in use  no skin breakdown this admission  Take off brief when possible and continue to monitor patient's skin      *See Care Plan and progress notes for long and short-term goals.  Barriers to Discharge: pre existing cognitive deficits    Possible Resolutions to Barriers:  family training    Discharge Planning/Teaching Needs:  Daughter wants to be able to  take pt home but is dependent on her level and if she and friend can provide the care she needs. Daughter has been here and observed in therapies.      Team Discussion:  Pt not progressing well, due to her dementia-no carryover and flucuates between levels. Pt has poor safety awareness and attention span of seconds. Motor planning problems limiting her progress also. Need family education to see if daughter can  provide the car ept will require at home. Aware of NH option  Revisions to Treatment Plan:  Unsure if family can provide the care pt requires now.   Continued Need for Acute Rehabilitation Level of Care: The patient requires daily medical management by a physician with specialized training in physical medicine and rehabilitation for the following conditions: Daily direction of a multidisciplinary physical rehabilitation program to ensure safe treatment while eliciting the highest outcome that is of practical value to the patient.: Yes Daily medical management of patient stability for increased activity during participation in an intensive rehabilitation regime.: Yes Daily analysis of laboratory values and/or radiology reports with any subsequent need for medication adjustment of medical intervention for : Neurological problems  [name], [name] 01/20/2016, 12:13 PM

## 2016-01-20 NOTE — Progress Notes (Signed)
Occupational Therapy Session Note  Patient Details  Name: Kayla SantiagoMary C Voytko MRN: [id number] Date of Birth: January 31, 1931  Today's Date: 01/20/2016 OT Individual Time: [id number] OT Individual Time Calculation (min): 42 min    Short Term Goals: Week 2:  OT Short Term Goal 1 (Week 2): Continue working on established LTGs set mostly at min assist level.  Skilled Therapeutic Interventions/Progress Updates:    Pt worked on dressing during session.  Max assist for transfer from supine to sit EOB and mod assist for stand pivot transfer to the wheelchair.  Therapist provided min assist for sit to stand at the sink, as long as pt can place a hand on the sink for initiation of standing.  Once standing, she is able to maintain her balance with UE support while therapist provided total assist for cleaning bowel movement and brief.  She was able to donn her shirt over her left UE and head but needed max instructional cueing to place it over the RUE.  She was able to assist with donning the pants over the LLE as well but neglects the right side, and when therapist attempts to help her, she demonstrates decreased motor planing to lift the RLE and help place it.  Max demonstrational cueing with supervision to donn slip on shoe this session with max assist for the right.  Pt needed hand over hand assist to comb her hair with the RUE.  She would grasp the comb with the right hand but would only comb one or two strokes before stopping.  Pt left in wheelchair with safety belt in place and call button in reach.    Therapy Documentation Precautions:  Precautions Precautions: Fall Precaution Comments: hx of dementia, expressive delays, right in-attention and slight weakness Restrictions Weight Bearing Restrictions: No  Pain: Pain Assessment Pain Assessment: No/denies pain ADL: See Function Navigator for Current Functional Status.   Therapy/Group: Individual Therapy  Abdo Denault OTR/L 01/20/2016, 10:42 AM

## 2016-01-20 NOTE — Progress Notes (Signed)
Social Work Patient ID: [name], female   DOB: 1931-07-05, 82 y.o.   MRN: [id number] Met with pt, daughter and son in-law to discuss team conference goals-min-mod level, flucuates due to dementia and at the moment and target discharge date 3/25. All aware pt has not made much progress  This is due to her dementia. Pt will require 24 hr care at discharge whether she is here longer or not. Discussed NHP option. Daughter reports: " She will die there, plus she never wanted to go to one of those places." They will stay for her therapies this afternoon and see if they can manage her care at home. Daughter feels pt thinks she is already in a NH and this is the reason she is not doing well here. Maybe she would do better in her own Environment, but will still require 24 hr care. Work toward discharge Sat.

## 2016-01-20 NOTE — Discharge Instructions (Signed)
Inpatient Rehab Discharge Instructions  [name] Discharge date and time: No discharge date for patient encounter.   Activities/Precautions/ Functional Status: Activity: activity as tolerated Diet: regular diet Wound Care: none needed Functional status:  ___ No restrictions     ___ Walk up steps independently ___ 24/7 supervision/assistance   ___ Walk up steps with assistance ___ Intermittent supervision/assistance  ___ Bathe/dress independently ___ Walk with walker     ___ Bathe/dress with assistance ___ Walk Independently    ___ Shower independently _x STROKE/TIA DISCHARGE INSTRUCTIONS SMOKING Cigarette smoking nearly doubles your risk of having a stroke & is the single most alterable risk factor  If you smoke or have smoked in the last 12 months, you are advised to quit smoking for your health.  Most of the excess cardiovascular risk related to smoking disappears within a year of stopping.  Ask you doctor about anti-smoking medications  [place] Quit Line: 1-800-QUIT NOW  Free Smoking Cessation Classes [phone]  CHOLESTEROL Know your levels; limit fat & cholesterol in your diet  Lipid Panel     Component Value Date/Time   CHOL 201* 01/08/2016 0614   TRIG 215* 01/08/2016 0614   HDL 37* 01/08/2016 0614   CHOLHDL 5.4 01/08/2016 0614   VLDL 43* 01/08/2016 0614   LDLCALC 121* 01/08/2016 0614      Many patients benefit from treatment even if their cholesterol is at goal.  Goal: Total Cholesterol (CHOL) less than 160  Goal:  Triglycerides (TRIG) less than 150  Goal:  HDL greater than 40  Goal:  LDL (LDLCALC) less than 100   BLOOD PRESSURE American Stroke Association blood pressure target is less that 120/80 mm/Hg  Your discharge blood pressure is:  BP: 136/60 mmHg  Monitor your blood pressure  Limit your salt and alcohol intake  Many individuals will require more than one medication for high blood pressure  DIABETES (A1c is a blood sugar average for last 3  months) Goal HGBA1c is under 7% (HBGA1c is blood sugar average for last 3 months)  Diabetes: No known diagnosis of diabetes    Lab Results  Component Value Date   HGBA1C 5.4 01/08/2016     Your HGBA1c can be lowered with medications, healthy diet, and exercise.  Check your blood sugar as directed by your physician  Call your physician if you experience unexplained or low blood sugars.  PHYSICAL ACTIVITY/REHABILITATION Goal is 30 minutes at least 4 days per week  Activity: Increase activity slowly, Therapies: Physical Therapy: Home Health Return to work:   Activity decreases your risk of heart attack and stroke and makes your heart stronger.  It helps control your weight and blood pressure; helps you relax and can improve your mood.  Participate in a regular exercise program.  Talk with your doctor about the best form of exercise for you (dancing, walking, swimming, cycling).  DIET/WEIGHT Goal is to maintain a healthy weight  Your discharge diet is: Diet regular Room service appropriate?: Yes; Fluid consistency:: Thin  liquids Your height is:    Your current weight is:   Your Body Mass Index (BMI) is:     Following the type of diet specifically designed for you will help prevent another stroke.  Your goal weight range is:    Your goal Body Mass Index (BMI) is 19-24.  Healthy food habits can help reduce 3 risk factors for stroke:  High cholesterol, hypertension, and excess weight.  RESOURCES Stroke/Support Group:  Call [phone]   STROKE EDUCATION  PROVIDED/REVIEWED AND GIVEN TO PATIENT Stroke warning signs and symptoms How to activate emergency medical system (call 911). Medications prescribed at discharge. Need for follow-up after discharge. Personal risk factors for stroke. Pneumonia vaccine given:  Flu vaccine given:  My questions have been answered, the writing is legible, and I understand these instructions.  I will adhere to these goals & educational materials that  have been provided to me after my discharge from the hospital.    __ Walk with assistance    ___ Shower with assistance ___ No alcohol     ___ Return to work/school ________  Special Instructions:    My questions have been answered and I understand these instructions. I will adhere to these goals and the provided educational materials after my discharge from the hospital.  Patient/Caregiver Signature _______________________________ Date __________  Clinician Signature _______________________________________ Date __________  Please bring this form and your medication list with you to all your follow-up doctor's appointments.

## 2016-01-20 NOTE — Progress Notes (Signed)
Physical Therapy Weekly Progress Note  Patient Details  Name: [name] MRN: [id number] Date of Birth: 05-09-1931  Beginning of progress report period: January 12, 2016 End of progress report period: January 20, 2016  Today's Date: 01/20/2016 PT Individual Time: 9767-3419 PT Individual Time Calculation (min): 58 min   Patient has met 3 of 4 short term goals.    Patient continues to demonstrate the following deficits: initiation, attention, balance, endurance, strength, and therefore will continue to benefit from skilled PT intervention to enhance overall performance with activity tolerance, balance, attention, awareness and coordination.  Patient progressing toward long term goals..  Continue plan of care.  PT Short Term Goals Week 1:  PT Short Term Goal 1 (Week 1): Patient will be able to perform bed mobility with Mod A.  PT Short Term Goal 1 - Progress (Week 1): Met PT Short Term Goal 2 (Week 1): Patient will be able to ambulate 18f with RW with mod A PT Short Term Goal 2 - Progress (Week 1): Partly met PT Short Term Goal 3 (Week 1): Patient will perform WC<>bed transfer with min A  PT Short Term Goal 3 - Progress (Week 1): Met PT Short Term Goal 4 (Week 1): Patient will be able to Ascend 1 step with mod A.  PT Short Term Goal 4 - Progress (Week 1): Met Week 2:  PT Short Term Goal 1 (Week 2): STG=LTG due estimated length of stay   Patient has made progress and anticipate   Therapy Documentation Precautions:  Precautions Precautions: Fall Precaution Comments: hx of dementia, expressive delays, right in-attention and slight weakness Restrictions Weight Bearing Restrictions: No  Pt received in w/c with family present. Daughter, [name] reports she plans to live with pt upon d/c. Daughter and spouse educated on patient's need for 24/7 hands-on  Assistance, daughter educated on goals set at a min assist level and current plan of care. Daughter verbalized understanding however  continually asked when she would be able to do walk without assistance. Daughter educated that that patient will need hands on assistance for the foreseeable future. Daughter alerted this PT to her past medical history and concerns she had regarding her own healtjh. THerapist discussed proper body mechanics in order to decrease risk of injury and improve overall safety of both patient and daughter. Daughter verbalized understanding.  Pt transferred sit>stand with min to mod A & maximum verbal/visual/tactile cuing for hand placement on armrests of w/c to push up. Pt with decreased anterior weight shift during transfer. Daughter able to perform transfer with minimal verbal cues for safety, positioning and hand placement.  Pt ambulated with HHA (with assist from daughter) with min assist from daughter. Patient ambulated 65 feet. Patient incontinent of bowel and bladder during session.  Pt ambulated 10 ft and ambulated into bathroom, requiring moderate assistance for positioning by toilet. Pt required total A for doffing, and later donning pants & brief, and maximum tactile cuing for stand<>sit. Pt also required total A for hygiene assistance. Daughter performed these task mod verbal cues for safety, postioning and hand placement of patient. Therapist provided min assist throughout toileting and hygiene to improve the safety and understanding of level of care patient requires. Daughter educated throughout session on positioning and safety required. Daughter expressed multiple times throughout session that "I don't think I can handle this at home".  At end of session pt left in w/c with QRB in place, all needs within reach, and family present to supervise. PT educated pt's  family on this being pt's new baseline level of function. Therapist recommend daughter consider skilled nursing home placement secondary to concerns of safety for and daughter due to poor carry-over of learned techniques from previous family training  session and daughter's continued expression of "I don't think I can handle this at home". Daughter verbalized understanding of recommendation however states she would like to try this at home. Daughter states she can be in on Friday at [place] for continued family training. Daughter plans to stay for OT session with [name] at 3pm for continued family training. Daughter states she was overwhelmed and will do better. Daughter educated again of the need for 24/7 assistance and educated that she was not cleared to assist patient in room secondary to safety concerns during family training session. Daughter verbalized understanding and in agreement with recommendation. Daughter states she will do better with scheduled training session on Friday.   See Function Navigator for Current Functional Status.  Therapy/Group: Individual Therapy  Retta Diones 01/20/2016, 3:25 PM

## 2016-01-20 NOTE — Progress Notes (Addendum)
Social Work Patient ID: [name], female   DOB: 07-21-1931, 82 y.o.   MRN: [id number] Met with daughter who went to PT with pt and realizing how much care pt will need at discharge. Have given her a list of NH's in [place]. During our conversation she has decided she needs to try it at home first before deciding to place Mom in a NH. Discussed the difficulty in going from home to a NH versus going from hospital to [facility]. She feels pt's PCP will assist with this if needed. Will work on pt going home and make arrangements. Have asked daughter to be here and attend therapies to feel comfortable with pt's care at home. Daughter aware of my absence and Lennart Pall will be covering for me Thursday and Friday.

## 2016-01-20 NOTE — Progress Notes (Signed)
Subjective/Complaints: Alert in bed, s/p breakfast  ROS- cannot obtain due to Mental status   Objective: Vital Signs: Blood pressure 136/60, pulse 76, temperature 98.3 F (36.8 C), temperature source Oral, resp. rate 18, SpO2 95 %. No results found. Results for orders placed or performed during the hospital encounter of 01/12/16 (from the past 72 hour(s))  CBC     Status: Abnormal   Collection Time: 01/18/16  6:34 AM  Result Value Ref Range   WBC 11.3 (H) 4.0 - 10.5 K/uL   RBC 3.95 3.87 - 5.11 MIL/uL   Hemoglobin 12.0 12.0 - 15.0 g/dL   HCT 36.4 36.0 - 46.0 %   MCV 92.2 78.0 - 100.0 fL   MCH 30.4 26.0 - 34.0 pg   MCHC 33.0 30.0 - 36.0 g/dL   RDW 13.4 11.5 - 15.5 %   Platelets 349 150 - 400 K/uL     General: No acute distress Mood and affect are appropriate Heart: Regular rate and rhythm no rubs murmurs or extra sounds Lungs: Clear to auscultation, breathing unlabored, no rales or wheezes Abdomen: Positive bowel sounds, soft nontender to palpation, nondistended Extremities: No clubbing, cyanosis, or edema Skin: No evidence of breakdown, no evidence of rash Neurologic: lethargic. Cranial nerves II through XII intact, motor strength is difficult to assess due to apraxia/arousal Sensory exam normal sensation to light touch and proprioception in bilateral upper and lower extremities Cerebellar exam normal finger to nose to finger as well as heel to shin in bilateral upper and lower extremities Musculoskeletal: Full range of motion in all 4 extremities. No joint swelling   Assessment/Plan: 1. Functional deficits secondary to Right hemiparesis due to Left post frontal infarct which require 3+ hours per day of interdisciplinary therapy in a comprehensive inpatient rehab setting. Physiatrist is providing close team supervision and 24 hour management of active medical problems listed below. Physiatrist and rehab team continue to assess barriers to discharge/monitor patient  progress toward functional and medical goals. FIM: Function - Bathing Position: Wheelchair/chair at sink Body parts bathed by patient: Left upper leg, Right upper leg Body parts bathed by helper: Right arm, Front perineal area, Buttocks, Right lower leg, Left lower leg, Back, Left arm, Chest, Abdomen Bathing not applicable: Back Assist Level: 2 helpers  Function- Upper Body Dressing/Undressing What is the patient wearing?: Pull over shirt/dress Bra - Perfomed by patient: Thread/unthread left bra strap Bra - Perfomed by helper: Thread/unthread right bra strap, Hook/unhook bra (pull down sports bra) Pull over shirt/dress - Perfomed by patient: Thread/unthread left sleeve, Put head through opening, Pull shirt over trunk Pull over shirt/dress - Perfomed by helper: Thread/unthread right sleeve Button up shirt - Perfomed by patient: Thread/unthread left sleeve Button up shirt - Perfomed by helper: Thread/unthread right sleeve, Thread/unthread left sleeve, Pull shirt around back, Button/unbutton shirt Assist Level: Touching or steadying assistance(Pt > 75%) (max assist) Function - Lower Body Dressing/Undressing What is the patient wearing?: Pants, Shoes, Ted Hose Position: Wheelchair/chair at [employer]- Performed by patient: Pull pants up/down, Thread/unthread left pants leg Pants- Performed by helper: Thread/unthread right pants leg Non-skid slipper socks- Performed by helper: Don/doff right sock, Don/doff left sock Socks - Performed by helper: Don/doff right sock, Don/doff left sock Shoes - Performed by patient: Don/doff left shoe Shoes - Performed by helper: Don/doff right shoe TED Hose - Performed by patient: Don/doff right TED hose, Don/doff left TED hose TED Hose - Performed by helper: Don/doff right TED hose, Don/doff left TED hose Assist for lower  body dressing: 2 Helpers  Function - Toileting Toileting activity did not occur: No continent bowel/bladder event Toileting steps  completed by helper: Adjust clothing prior to toileting, Performs perineal hygiene, Adjust clothing after toileting  Function - Toilet Transfers Toilet transfer activity did not occur: Safety/medical concerns Toilet transfer assistive device: Walker Assist level to toilet: Total assist (Pt < 25%) Assist level from toilet: Maximal assist (Pt 25 - 49%/lift and lower)  Function - Chair/bed transfer Chair/bed transfer method: Stand pivot Chair/bed transfer assist level: Maximal assist (Pt 25 - 49%/lift and lower) Chair/bed transfer assistive device: Armrests Chair/bed transfer details: Visual cues/gestures for sequencing, Tactile cues for sequencing, Tactile cues for posture, Tactile cues for weight shifting, Manual facilitation for weight shifting, Verbal cues for sequencing  Function - Locomotion: Wheelchair Will patient use wheelchair at discharge?: Yes Type: Manual Max wheelchair distance: 55f Assist Level: Maximal assistance (Pt 25 - 49%) Wheel 50 feet with 2 turns activity did not occur: Safety/medical concerns Wheel 150 feet activity did not occur: Safety/medical concerns Turns around,maneuvers to table,bed, and toilet,negotiates 3% grade,maneuvers on rugs and over doorsills: No Function - Locomotion: Ambulation Assistive device: Hand held assist Max distance: 25 Assist level: Touching or steadying assistance (Pt > 75%) Assist level: Touching or steadying assistance (Pt > 75%) Walk 50 feet with 2 turns activity did not occur: Safety/medical concerns Assist level: 2 helpers Walk 150 feet activity did not occur: Safety/medical concerns Assist level: Moderate assist (Pt 50 - 74%)  Function - Comprehension Comprehension: Auditory Comprehension assist level: Understands basic 25 - 49% of the time/ requires cueing 50 - 75% of the time  Function - Expression Expression: Verbal Expression assist level: Expresses basic 25 - 49% of the time/requires cueing 50 - 75% of the time. Uses  single words/gestures.  Function - Social Interaction Social Interaction assist level: Interacts appropriately less than 25% of the time. May be withdrawn or combative.  Function - Problem Solving Problem solving assist level: Solves basic less than 25% of the time - needs direction nearly all the time or does not effectively solve problems and may need a restraint for safety  Function - Memory Memory assist level: Recognizes or recalls less than 25% of the time/requires cueing greater than 75% of the time Patient normally able to recall (first 3 days only): None of the above  Medical Problem List and Plan: 1.  Right sided weakness secondary to left posterior frontal lobe infarct-Team conference today please see physician documentation under team conference tab, met with team face-to-face to discuss problems,progress, and goals. Formulized individual treatment plan based on medical history, underlying problem and comorbidities.              2.  DVT Prophylaxis/Anticoagulation: Subcutaneous Lovenox. Monitor platelet counts and any signs of bleeding, last plt on 3/15 was 285K 3. Pain Management: Tylenol as needed 4. Mood/dementia: Namenda 28 mg daily,Exelon patch, moderate dementia with acute worsening of cognition due to CVA, pt had been of exelon patch several days while on acute care 5. Neuropsych: This patient is not capable of making decisions on her own behalf.  This was true even prior to CVA 6. Skin/Wound Care: Routine skin checks, 7. Fluids/Electrolytes/Nutrition: Routine I&O with follow-up chemistries, taking 90-100% meals  9. Hypothyroidism. Synthroid. TSH 4.258, continue home dose of 0.248m10. Chronic renal insufficiency. Baseline creatinine 1.08. Follow-up chemistries stable/normal 11. Hyperlipidemia. Lipitor 12.  Hypoalbuminemia - prostat,   LOS (Days) 8 A FACE TO FACE EVALUATION WAS PERFORMED  [name]  E 01/20/2016, 8:53 AM

## 2016-01-20 NOTE — Progress Notes (Signed)
Occupational Therapy Session Note  Patient Details  Name: [name] MRN: [id number] Date of Birth: 07-17-1931  Today's Date: 01/20/2016 OT Individual Time: 4098-1191 OT Individual Time Calculation (min): 33 min    Short Term Goals: Week 1:  OT Short Term Goal 1 (Week 1): Pt will complete UB bathing with min assist and mod instructional cueing. OT Short Term Goal 1 - Progress (Week 1): Not met OT Short Term Goal 2 (Week 1): Pt will donn pullover shirt with mod assist sitting EOB or EOC. OT Short Term Goal 2 - Progress (Week 1): Met OT Short Term Goal 3 (Week 1): Pt will complete shower transfer with min assist using RW stand pivot. OT Short Term Goal 3 - Progress (Week 1): Not met OT Short Term Goal 4 (Week 1): Pt will donn pull up pants with min assist sit to stand and mod instructional cueing.   OT Short Term Goal 4 - Progress (Week 1): Not met  Skilled Therapeutic Interventions/Progress Updates:    Pt's daughter present for beginning of family education.  Discussed pt's need for 24 hour physical assist to complete all selfcare tasks and transfers at this time.  She voices understanding and is eager to try hands on with this therapist.  Supplied gait belt and discussed the need to purchase one from the gift shop to assist with sit to stand and mobility using the RW.  Attempted sit to stand without RW and pt with delayed initiation requiring mod to max assist on two occasions.  When therapist positioned wheelchair in front of the sink, pt was able to complete sit to stand with min assist using the LUE on the sink to initiate the standing.   Utilized RW as well with pt completing sit to stand with min to mod assist with daughter providing assistance.  Had her assist the pt to the bathroom for simulated toileting.  Pt needs mod assist to turn the walker at times but maintains balance with mobility with min assist.  One LOB posteriorly when attempting to have pt step back closer to the 3:1.  However, daughter was able to help her sit down safely with use of the gait belt.  Discussed hand placement for sit to stand and when assisting with toileting as well.  She was able to ambulate her back out to the chair with min hand held assist after standing to complete session.  Daughter plans to come back in for ADL session on Friday for more hands on.  She felt better about assisting her during this session compared to previous PT session.  Discussed that we have to keep experimenting with different ways and methods to figure out which way is safest for both the pt and herself.  Pt left in wheelchair with pt's daughter and spouse present.  Safety belt in place as well.     Therapy Documentation Precautions:  Precautions Precautions: Fall Precaution Comments: hx of dementia, expressive delays, right in-attention and slight weakness Restrictions Weight Bearing Restrictions: No  Pain: Pain Assessment Pain Assessment: Faces Pain Score: 0-No pain Faces Pain Scale: No hurt ADL: See Function Navigator for Current Functional Status.   Therapy/Group: Individual Therapy  Beretta Ginsberg OTR/L 01/20/2016, 4:42 PM

## 2016-01-20 NOTE — Progress Notes (Signed)
Speech Language Pathology Daily Session Note  Patient Details  Name: [name] MRN: [id number] Date of Birth: 10/22/1931  Today's Date: 01/20/2016 SLP Concurrent Time:  1100- 1200 60 minutes    Short Term Goals: Week 1: SLP Short Term Goal 1 (Week 1): Pt will follow 1 step commands during basic, familiar tasks with max assist multimodal cues.   SLP Short Term Goal 2 (Week 1): Pt will answer basic, biographical yes/no questions for >50% accuracy with max assist multimodal cues.  SLP Short Term Goal 3 (Week 1): Pt will sustain her attention to a basic familiar task for ~3 minute intervals with max assist verbal and tactile cues for redirection.   SLP Short Term Goal 4 (Week 1): Pt will complete a basic, familiar task with max assist verbal and tactile cues for initiation and sequencing.   SLP Short Term Goal 5 (Week 1): Pt will utilize call bell to convey needs/wants to staff members with max assist verbal and tactile cues.    Skilled Therapeutic Interventions: Pt positioned upright in wheelchair. Therapy focusing on cognitive goals for initiation of task. Pt required mod- max verbal, tactile, and visual cueing to initiate activity in familiar and novel functional actitivites. Pt was alert throughout the session and intermittently demonstrated simple verbal response and reciprocated smile.    Function:  Eating Eating   Modified Consistency Diet: No Eating Assist Level: Help with picking up utensils;Help managing cup/glass;Supervision or verbal cues;Set up assist for;Hand over hand assist   Eating Set Up Assist For: Opening containers;Cutting food;Applying device (includes dentures) Helper Scoops Food on Utensil: Every scoop Helper Brings Food to Mouth: Occasionally   Cognition Comprehension Comprehension assist level: Understands basic 25 - 49% of the time/ requires cueing 50 - 75% of the time  Expression   Expression assist level: Expresses basis less than 25% of the time/requires  cueing >75% of the time.  Social Interaction Social Interaction assist level: Interacts appropriately less than 25% of the time. May be withdrawn or combative.  Problem Solving Problem solving assist level: Solves basic less than 25% of the time - needs direction nearly all the time or does not effectively solve problems and may need a restraint for safety  Memory Memory assist level: Recognizes or recalls less than 25% of the time/requires cueing greater than 75% of the time    Pain Pain Assessment Pain Assessment: Faces Faces Pain Scale: No hurt  Therapy/Group: Individual Therapy  Rocky CraftsKara E Chaitanya Amedee 01/20/2016, 3:44 PM

## 2016-01-21 ENCOUNTER — Inpatient Hospital Stay (HOSPITAL_COMMUNITY): Payer: Medicare Other | Admitting: Speech Pathology

## 2016-01-21 ENCOUNTER — Inpatient Hospital Stay (HOSPITAL_COMMUNITY): Payer: Medicare Other | Admitting: Physical Therapy

## 2016-01-21 ENCOUNTER — Inpatient Hospital Stay (HOSPITAL_COMMUNITY): Payer: Medicare Other | Admitting: Occupational Therapy

## 2016-01-21 DIAGNOSIS — I63432 Cerebral infarction due to embolism of left posterior cerebral artery: Secondary | ICD-10-CM

## 2016-01-21 NOTE — Progress Notes (Signed)
Speech Language Pathology Daily Session Note  Patient Details  Name: Kayla SantiagoMary C Borgwardt MRN: [id number] Date of Birth: 04-12-1931  Today's Date: 01/21/2016 SLP Individual Time: [id number] SLP Individual Time Calculation (min): 27 min  Short Term Goals: Week 1: SLP Short Term Goal 1 (Week 1): Pt will follow 1 step commands during basic, familiar tasks with max assist multimodal cues.   SLP Short Term Goal 2 (Week 1): Pt will answer basic, biographical yes/no questions for >50% accuracy with max assist multimodal cues.  SLP Short Term Goal 3 (Week 1): Pt will sustain her attention to a basic familiar task for ~3 minute intervals with max assist verbal and tactile cues for redirection.   SLP Short Term Goal 4 (Week 1): Pt will complete a basic, familiar task with max assist verbal and tactile cues for initiation and sequencing.   SLP Short Term Goal 5 (Week 1): Pt will utilize call bell to convey needs/wants to staff members with max assist verbal and tactile cues.    Skilled Therapeutic Interventions: Pt positioned upright in bed. Pt minimally verbal except for occasional one word responses to social niceties. No response to yes/no questions or requests to follow directives. Max A to elicit minimal participation with functional activities.    Function:  Eating Eating   Modified Consistency Diet: No Eating Assist Level: Helper scoops food on utensil;Help with picking up utensils;Hand over hand assist   Eating Set Up Assist For: Opening containers Helper Scoops Food on Utensil: Occasionally Helper Brings Food to Mouth: Occasionally   Cognition Comprehension Comprehension assist level: Understands basic less than 25% of the time/ requires cueing >75% of the time  Expression   Expression assist level: Expresses basis less than 25% of the time/requires cueing >75% of the time.  Social Interaction Social Interaction assist level: Interacts appropriately less than 25% of the time. May be withdrawn  or combative.  Problem Solving Problem solving assist level: Solves basic less than 25% of the time - needs direction nearly all the time or does not effectively solve problems and may need a restraint for safety  Memory Memory assist level: Recognizes or recalls less than 25% of the time/requires cueing greater than 75% of the time    Pain Pain Assessment Pain Assessment: Faces Faces Pain Scale: No hurt  Therapy/Group: Individual Therapy  Rocky CraftsKara E Jaylee Lantry 01/21/2016, 2:32 PM

## 2016-01-21 NOTE — Progress Notes (Signed)
Physical Therapy Session Note  Patient Details  Name: [name] MRN: [id number] Date of Birth: Jan 18, 1931  Today's Date: 01/21/2016 PT Individual Time: 1100-1147 PT Individual Time Calculation (min): 47 min   Short Term Goals: Week 2:  PT Short Term Goal 1 (Week 2): STG=LTG due estimated length of stay   Therapy Documentation Precautions:  Precautions Precautions: Fall Precaution Comments: hx of dementia, expressive delays, right in-attention and slight weakness Restrictions Weight Bearing Restrictions: No   Pain: Pain Assessment Pain Assessment: No/denies pain   Patient received in wheelchair seated at nursing station.   SIt to and from stand transfer with or without RW min assist.  Patient ambulated 150 feet with RW min assist. Patient requires manual facilitation for initiation, weight shifting and step length. Patient requires increased time to complete task.   Patient up and down 12 steps with bilateral handrails moderate assistance. Patient performed stairs with a step to pattern.  Patient requires manual facilitation for initiation, weight shifting and step length. Patient requires increased time to complete task.   Patient performed car transfer with RW mod assist.  Patient ambulated 15 feet over uneven surfaces with handheld assist mod assist.  Patient negotiated 6 inch curb with left hand held assist mod assist.  Patient returned to Nursing station at end of session with all needs met and quick release belt engaged.   See Function Navigator for Current Functional Status.   Therapy/Group: Individual Therapy  Retta Diones 01/21/2016, 11:34 AM

## 2016-01-21 NOTE — Progress Notes (Signed)
Speech Language Pathology Daily Session Note  Patient Details  Name: Kayla SantiagoMary C Schroeder MRN: [id number] Date of Birth: 1930/11/30  Today's Date: 01/21/2016 SLP Individual Time: 1000-1100 SLP Individual Time Calculation (min): 60 min  Short Term Goals: Week 1: SLP Short Term Goal 1 (Week 1): Pt will follow 1 step commands during basic, familiar tasks with max assist multimodal cues.   SLP Short Term Goal 2 (Week 1): Pt will answer basic, biographical yes/no questions for >50% accuracy with max assist multimodal cues.  SLP Short Term Goal 3 (Week 1): Pt will sustain her attention to a basic familiar task for ~3 minute intervals with max assist verbal and tactile cues for redirection.   SLP Short Term Goal 4 (Week 1): Pt will complete a basic, familiar task with max assist verbal and tactile cues for initiation and sequencing.   SLP Short Term Goal 5 (Week 1): Pt will utilize call bell to convey needs/wants to staff members with max assist verbal and tactile cues.    Skilled Therapeutic Interventions: Skilled ST session focusing on cognitive goals. SLP facilitated session by providing max multi-modal cues (verbal visual demonstration and tactical) for task initatiation, attention and completion of basic familiar tasks. Pt was nonverbal throughout session and knoded head "yes" x 1 in response to basic yes/no question. Pt left upright in wheelchair at nursing station with safety belt in place. Continue current plan of care.   Function:  Eating Eating   Modified Consistency Diet: No Eating Assist Level: Helper scoops food on utensil;Help with picking up utensils;Hand over hand assist   Eating Set Up Assist For: Opening containers Helper Scoops Food on Utensil: Occasionally Helper Brings Food to Mouth: Occasionally   Cognition Comprehension Comprehension assist level: Understands basic 25 - 49% of the time/ requires cueing 50 - 75% of the time  Expression   Expression assist level: Expresses basis  less than 25% of the time/requires cueing >75% of the time.  Social Interaction Social Interaction assist level: Interacts appropriately less than 25% of the time. May be withdrawn or combative.  Problem Solving Problem solving assist level: Solves basic less than 25% of the time - needs direction nearly all the time or does not effectively solve problems and may need a restraint for safety  Memory Memory assist level: Recognizes or recalls less than 25% of the time/requires cueing greater than 75% of the time    Pain Pain Assessment Pain Assessment: No/denies pain  Therapy/Group: Individual Therapy  Ailah Barna 01/21/2016, 10:57 AM

## 2016-01-21 NOTE — Plan of Care (Signed)
Problem: RH SKIN INTEGRITY Goal: RH STG SKIN FREE OF INFECTION/BREAKDOWN No skin breakdown/infection this admission with minimal.  Outcome: Progressing No brief HS allowing skin OTA

## 2016-01-21 NOTE — Plan of Care (Signed)
Problem: RH Toileting Goal: LTG Patient will perform toileting w/assist, cues/equip (OT) LTG: Patient will perform toiletiing (clothes management/hygiene) with assist, with/without cues using equipment (OT)  Goals downgraded based on pt making slower progress than expected.

## 2016-01-21 NOTE — Progress Notes (Signed)
Occupational Therapy Session Note  Patient Details  Name: [name] MRN: [id number] Date of Birth: [birth year]  Today's Date: 01/21/2016 OT Individual Time: 0800-0900 OT Individual Time Calculation (min): 60 min    Short Term Goals: Week 1:  OT Short Term Goal 1 (Week 1): Pt will complete UB bathing with min assist and mod instructional cueing. OT Short Term Goal 1 - Progress (Week 1): Not met OT Short Term Goal 2 (Week 1): Pt will donn pullover shirt with mod assist sitting EOB or EOC. OT Short Term Goal 2 - Progress (Week 1): Met OT Short Term Goal 3 (Week 1): Pt will complete shower transfer with min assist using RW stand pivot. OT Short Term Goal 3 - Progress (Week 1): Not met OT Short Term Goal 4 (Week 1): Pt will donn pull up pants with min assist sit to stand and mod instructional cueing.   OT Short Term Goal 4 - Progress (Week 1): Not met  Skilled Therapeutic Interventions/Progress Updates:    Pt completed shower and dressing during session.  Mod demonstrational cueing for all sit to stand at the sink and for use of RW for ambulation to the shower.  Max assist to guide walker as well as mod assist to help guide pt to the shower bench.  Pt washed her face and left upper leg but would not continue washing other parts, even when given hand over hand guidance.  The same with drying off as well, only drying her face.  Max assist for LB dressing as even with donning pants she continued placing the LLE in the right pants leg.  She did donn her pullover shirt over her left arm but then needed max assist to donn over the right secondary to neglect, before attempting to pull it over her head.  Assist needed to pull it down over her trunk as well.  Pt left in wheelchair with call button next to her and safety belt in place.   Therapy Documentation Precautions:  Precautions Precautions: Fall Precaution Comments: hx of dementia, expressive delays, right in-attention and slight  weakness Restrictions Weight Bearing Restrictions: No  Pain: Pain Assessment Pain Assessment: No/denies pain ADL: See Function Navigator for Current Functional Status.   Therapy/Group: Individual Therapy  Talbot Monarch OTR/L 01/21/2016, 9:01 AM

## 2016-01-21 NOTE — Progress Notes (Signed)
Subjective/Complaints: Using mainly LLUE for facial hygiene  ROS- cannot obtain due to Mental status   Objective: Vital Signs: Blood pressure 136/65, pulse 88, temperature 98.6 F (37 C), temperature source Oral, resp. rate 20, SpO2 95 %. No results found. No results found for this or any previous visit (from the past 72 hour(s)).   General: No acute distress Mood and affect are appropriate Heart: Regular rate and rhythm no rubs murmurs or extra sounds Lungs: Clear to auscultation, breathing unlabored, no rales or wheezes Abdomen: Positive bowel sounds, soft nontender to palpation, nondistended Extremities: No clubbing, cyanosis, or edema Skin: No evidence of breakdown, no evidence of rash Neurologic: lethargic. Cranial nerves II through XII intact, motor strength is difficult to assess due to apraxia/arousal Sensory exam normal sensation to light touch and proprioception in bilateral upper and lower extremities Cerebellar exam normal finger to nose to finger as well as heel to shin in bilateral upper and lower extremities Musculoskeletal: Full range of motion in all 4 extremities. No joint swelling   Assessment/Plan: 1. Functional deficits secondary to Right hemiparesis due to Left post frontal infarct which require 3+ hours per day of interdisciplinary therapy in a comprehensive inpatient rehab setting. Physiatrist is providing close team supervision and 24 hour management of active medical problems listed below. Physiatrist and rehab team continue to assess barriers to discharge/monitor patient progress toward functional and medical goals. FIM: Function - Bathing Position: Wheelchair/chair at sink Body parts bathed by patient: Left upper leg, Right upper leg Body parts bathed by helper: Right arm, Front perineal area, Buttocks, Right lower leg, Left lower leg, Back, Left arm, Chest, Abdomen Bathing not applicable: Back Assist Level: 2 helpers  Function- Upper Body  Dressing/Undressing What is the patient wearing?: Pull over shirt/dress Bra - Perfomed by patient: Thread/unthread left bra strap Bra - Perfomed by helper: Thread/unthread right bra strap, Hook/unhook bra (pull down sports bra) Pull over shirt/dress - Perfomed by patient: Thread/unthread left sleeve, Put head through opening, Pull shirt over trunk, Thread/unthread right sleeve Pull over shirt/dress - Perfomed by helper: Thread/unthread right sleeve Button up shirt - Perfomed by patient: Thread/unthread left sleeve Button up shirt - Perfomed by helper: Thread/unthread right sleeve, Thread/unthread left sleeve, Pull shirt around back, Button/unbutton shirt Assist Level: Touching or steadying assistance(Pt > 75%) (max assist) Function - Lower Body Dressing/Undressing What is the patient wearing?: Pants, Shoes, Ted Hose Position: Wheelchair/chair at [employer]- Performed by patient: Thread/unthread left pants leg Pants- Performed by helper: Thread/unthread right pants leg, Thread/unthread left pants leg Non-skid slipper socks- Performed by helper: Don/doff right sock, Don/doff left sock Socks - Performed by helper: Don/doff right sock, Don/doff left sock Shoes - Performed by patient: Don/doff left shoe Shoes - Performed by helper: Don/doff left shoe TED Hose - Performed by patient: Don/doff right TED hose, Don/doff left TED hose TED Hose - Performed by helper: Don/doff right TED hose, Don/doff left TED hose Assist for lower body dressing: 2 Helpers  Function - Toileting Toileting activity did not occur: No continent bowel/bladder event Toileting steps completed by helper: Adjust clothing prior to toileting, Performs perineal hygiene, Adjust clothing after toileting  Function - Archivist transfer activity did not occur: Safety/medical concerns Toilet transfer assistive device: Walker Assist level to toilet: Total assist (Pt < 25%) Assist level from toilet: Maximal assist (Pt 25  - 49%/lift and lower)  Function - Chair/bed transfer Chair/bed transfer method: Stand pivot Chair/bed transfer assist level: Maximal assist (Pt 25 - 49%/lift  and lower) Chair/bed transfer assistive device: Armrests Chair/bed transfer details: Visual cues/gestures for sequencing, Tactile cues for sequencing, Tactile cues for posture, Tactile cues for weight shifting, Manual facilitation for weight shifting, Verbal cues for sequencing  Function - Locomotion: Wheelchair Will patient use wheelchair at discharge?: Yes Type: Manual Max wheelchair distance: 2915ft Assist Level: Maximal assistance (Pt 25 - 49%) Wheel 50 feet with 2 turns activity did not occur: Safety/medical concerns Wheel 150 feet activity did not occur: Safety/medical concerns Turns around,maneuvers to table,bed, and toilet,negotiates 3% grade,maneuvers on rugs and over doorsills: No Function - Locomotion: Ambulation Assistive device: Hand held assist Max distance: 25 Assist level: Touching or steadying assistance (Pt > 75%) Assist level: Touching or steadying assistance (Pt > 75%) Walk 50 feet with 2 turns activity did not occur: Safety/medical concerns Assist level: 2 helpers Walk 150 feet activity did not occur: Safety/medical concerns Assist level: Moderate assist (Pt 50 - 74%)  Function - Comprehension Comprehension: Auditory Comprehension assist level: Understands basic 25 - 49% of the time/ requires cueing 50 - 75% of the time  Function - Expression Expression: Verbal Expression assist level: Expresses basic 25 - 49% of the time/requires cueing 50 - 75% of the time. Uses single words/gestures.  Function - Social Interaction Social Interaction assist level: Interacts appropriately less than 25% of the time. May be withdrawn or combative.  Function - Problem Solving Problem solving assist level: Solves basic less than 25% of the time - needs direction nearly all the time or does not effectively solve problems and  may need a restraint for safety  Function - Memory Memory assist level: Recognizes or recalls less than 25% of the time/requires cueing greater than 75% of the time Patient normally able to recall (first 3 days only): None of the above  Medical Problem List and Plan: 1.  Right sided weakness secondary to left posterior frontal lobe infarct-discussed with OT, family coming in for training today           2.  DVT Prophylaxis/Anticoagulation: Subcutaneous Lovenox. Monitor platelet counts and any signs of bleeding, last plt on 3/15 was 285K 3. Pain Management: Tylenol as needed 4. Mood/dementia: Namenda 28 mg daily,Exelon patch, moderate dementia with acute worsening of cognition due to CVA, pt had been of exelon patch several days while on acute care 5. Neuropsych: This patient is not capable of making decisions on her own behalf.  This was true even prior to CVA 6. Skin/Wound Care: Routine skin checks, 7. Fluids/Electrolytes/Nutrition: Routine I&O with follow-up chemistries, taking 90-100% meals  9. Hypothyroidism. Synthroid. TSH 4.258, continue home dose of 0.25mg  10. Chronic renal insufficiency. Baseline creatinine 1.08. Follow-up chemistries stable/normal 11. Hyperlipidemia. Lipitor 12.  Hypoalbuminemia - prostat,   LOS (Days) 9 A FACE TO FACE EVALUATION WAS PERFORMED  KIRSTEINS,ANDREW E 01/21/2016, 8:36 AM

## 2016-01-22 ENCOUNTER — Inpatient Hospital Stay (HOSPITAL_COMMUNITY): Payer: Medicare Other | Admitting: Speech Pathology

## 2016-01-22 ENCOUNTER — Inpatient Hospital Stay (HOSPITAL_COMMUNITY): Payer: Medicare Other | Admitting: Physical Therapy

## 2016-01-22 ENCOUNTER — Ambulatory Visit (HOSPITAL_COMMUNITY): Payer: Medicare Other | Admitting: Physical Therapy

## 2016-01-22 ENCOUNTER — Inpatient Hospital Stay (HOSPITAL_COMMUNITY): Payer: Medicare Other

## 2016-01-22 ENCOUNTER — Inpatient Hospital Stay (HOSPITAL_COMMUNITY): Payer: Medicare Other | Admitting: Occupational Therapy

## 2016-01-22 LAB — BASIC METABOLIC PANEL
ANION GAP: 13 (ref 5–15)
BUN: 11 mg/dL (ref 6–20)
CALCIUM: 8.2 mg/dL — AB (ref 8.9–10.3)
CO2: 20 mmol/L — ABNORMAL LOW (ref 22–32)
Chloride: 104 mmol/L (ref 101–111)
Creatinine, Ser: 1.09 mg/dL — ABNORMAL HIGH (ref 0.44–1.00)
GFR, EST AFRICAN AMERICAN: 53 mL/min — AB (ref >60)
GFR, EST NON AFRICAN AMERICAN: 46 mL/min — AB (ref >60)
Glucose, Bld: 183 mg/dL — ABNORMAL HIGH (ref 65–99)
POTASSIUM: 3.5 mmol/L (ref 3.5–5.1)
SODIUM: 137 mmol/L (ref 135–145)

## 2016-01-22 LAB — URINALYSIS, ROUTINE W REFLEX MICROSCOPIC
Bilirubin Urine: NEGATIVE
Glucose, UA: NEGATIVE mg/dL
KETONES UR: NEGATIVE mg/dL
LEUKOCYTES UA: NEGATIVE
NITRITE: NEGATIVE
PROTEIN: NEGATIVE mg/dL
Specific Gravity, Urine: 1.019 (ref 1.005–1.030)
pH: 6 (ref 5.0–8.0)

## 2016-01-22 LAB — CBC
HEMATOCRIT: 40.4 % (ref 36.0–46.0)
Hemoglobin: 12.8 g/dL (ref 12.0–15.0)
MCH: 29.8 pg (ref 26.0–34.0)
MCHC: 31.7 g/dL (ref 30.0–36.0)
MCV: 94 fL (ref 78.0–100.0)
Platelets: 373 10*3/uL (ref 150–400)
RBC: 4.3 MIL/uL (ref 3.87–5.11)
RDW: 13.7 % (ref 11.5–15.5)
WBC: 8 10*3/uL (ref 4.0–10.5)

## 2016-01-22 LAB — URINE MICROSCOPIC-ADD ON

## 2016-01-22 MED ORDER — ATORVASTATIN CALCIUM 40 MG PO TABS: 40.0000 mg | ORAL_TABLET | Freq: Every day | ORAL | Status: AC

## 2016-01-22 MED ORDER — LEVOTHYROXINE SODIUM 25 MCG PO TABS: 25.0000 ug | ORAL_TABLET | Freq: Every day | ORAL | Status: AC

## 2016-01-22 MED ORDER — OMEPRAZOLE 40 MG PO CPDR: 40.0000 mg | DELAYED_RELEASE_CAPSULE | Freq: Every day | ORAL | Status: DC

## 2016-01-22 MED ORDER — LEVOFLOXACIN 500 MG PO TABS
250.0000 mg | ORAL_TABLET | Freq: Every day | ORAL | Status: DC
  Administered 2016-01-22 – 2016-01-26 (×5): 250 mg via ORAL
  Filled 2016-01-22 (×5): qty 1

## 2016-01-22 NOTE — Progress Notes (Addendum)
Physical Therapy Note  Patient Details  Name: [name] MRN: [id number] Date of Birth: 01-09-1931 Today's Date: 01/22/2016    Time: 1100-1130 30 minutes  1:1 No signs/symptoms of pain. Pt initially involved in nursing care, then pt unable to stay awake or aroused enough to functional participate in PT session.  Education provided with pt's daughter and son in Social workerlaw.  Family educated on patient's fluctuating level of care and the need for min/mod A on "good days".  PT explained the importance of family taking care of themselves so they could provide the best care for the patient.  PT encouraged family to look at skilled nursing placement for continued rehab due to the patient's high level of care needs.  Family still deciding home vs SNF. Social work made aware.  Pt limited today by decline in medical status, decreased arousal level.   Time: 1300-1330 30 minutes  1:1 No c/o pain. Pt awake this session, able to participate in PT.  Pt's daughter educated on bed mobility, transfers and gait with RW. Pt's daughter required assistance with all mobility due to pt being heavy min/mod A for bed mobility and transfers. PT demo'd to daughter how to perform transfer and gait but daughter not able to perform safely without assistance at this time.  Daughter able to recall 2 aspiration precautions and pt began eating lunch. PT noticed an increase in coughing after eating, RN made aware.  [name] 01/22/2016, 11:38 AM

## 2016-01-22 NOTE — Progress Notes (Signed)
Social Work Patient ID: Kayla SantiagoMary C Schroeder, female   DOB: 1931/02/16, 82 y.o.   MRN: [id number]   Pt's d/c for 01-23-16 is canceled as pt's dtr feels pt may be too much care for her to handle at home and she is leaning toward SNF placement.  MD and PA have also ordered labs/tests due to pt's condition today and these are pending.  Following medical workup, CSW will work with pt/dtr re: their plans for SNF transfer.

## 2016-01-22 NOTE — Progress Notes (Signed)
Occupational Therapy Session Note  Patient Details  Name: [name] MRN: [id number] Date of Birth: [birth year]  Today's Date: 01/22/2016 OT Individual Time: 0900-1000 OT Individual Time Calculation (min): 60 min    Short Term Goals: Week 2:  OT Short Term Goal 1 (Week 2): Continue working on established LTGs set mostly at min assist level.  Skilled Therapeutic Interventions/Progress Updates:    Pt's daughter present for family education session.  She reports pt is coughing and feels like she's running a fever.  She states that the MD is planning a chest x-ray and blood work as well.  Worked on supine to sit and transfer to the wheelchair.  Pt with very little initiation even when given demonstrational cueing, compared the last couple of sessions.  Max assist for stand pivot transfer to the wheelchair with pt attempting to use the RW for transfer but not advancing it.  Needed max assist to advance the RLE as well.  Transferred to the shower from the wheelchair with max assist demonstrating bowel accident during transfer.  She was able to bathe her face only with total assist from daughter to wash all other parts.  Total assistance +2 for standing to wash peri area with pt demonstrating LOB posteriorly and to the right.  Total +2 also needed for transfer from the tub bench to the wheelchair, as pt would not move her LEs.  Once in the chair she needed total assist for all dressing tasks with +2 for standing to pull up brief and pants over hips.  Discussed with pt's daughter on how difficult it would be to take care of her mother at this current level and that SNF rehab may be the best fit for continued rehab, with the end goal getting her back to her house.  She agreed and SW notified.  Pt returned to bed at end of session for transport to x-ray.  Also discussed change in pt's functional level with PA.    Therapy Documentation Precautions:  Precautions Precautions: Fall Precaution Comments: hx of  dementia, expressive delays, right in-attention and slight weakness Restrictions Weight Bearing Restrictions: No  Vital Signs: Therapy Vitals Temp: 98.4 F (36.9 C) Temp Source: Oral Pulse Rate: 95 Patient Position (if appropriate): Sitting Oxygen Therapy SpO2: 96 % O2 Device: Not Delivered Pulse Oximetry Type: Intermittent Pain: Pain Assessment Pain Assessment: PAINAD PAINAD (Pain Assessment in Advanced Dementia) Breathing: normal Negative Vocalization: none Facial Expression: sad, frightened, frown Body Language: relaxed Consolability: no need to console PAINAD Score: 1 ADL: See Function Navigator for Current Functional Status.   Therapy/Group: Individual Therapy  Shawnmichael Parenteau OTR/L 01/22/2016, 12:36 PM

## 2016-01-22 NOTE — Discharge Summary (Signed)
NAMSherol Schroeder:  Schroeder, Kayla                  ACCOUNT NO.:  [id number]  MEDICAL RECORD NO.:  [id number]  LOCATION:  [id number]                        FACILITY:  [facility]  PHYSICIAN:  Erick ColaceAndrew E. Kirsteins, M.D.DATE OF BIRTH:  1931-03-29  DATE OF ADMISSION:  01/12/2016 DATE OF DISCHARGE:  01/26/2016                              DISCHARGE SUMMARY   DISCHARGE DIAGNOSES: 1. Left posterior frontal lobe infarct. 2. Subcutaneous Lovenox for deep vein thrombosis prophylaxis. 3. Dementia. 4. Hypothyroidism. 5. Chronic renal insufficiency. 6. Hyperlipidemia. 7. Hypoalbuminemia. 8. Hospital-acquired pneumonia  HISTORY OF PRESENT ILLNESS:  This is an 82 year old rightt-handed female with history of hypertension, Alzheimer's disease, and chronic renal insufficiency who presented on January 07, 2016, with acute onset of right- sided weakness.  By report, [facility] with diagnosis of urinary tract infection and placed on Cipro.  She lives with her daughter with assistance as needed.  MRI of the brain showed small-to-moderate volume of acute infarct, high left frontal lobe predominantly involving the ACA territory.  MRA with moderate-to-severe proximal left A2-ACA stenosis.  Echocardiogram with ejection fraction of 70% grade 1 diastolic dysfunction.  The patient did not receive tPA.  Maintained on aspirin for CVA prophylaxis. Subcutaneous Lovenox for DVT prophylaxis.  Chest x-ray, January 10, 2016, suggestive of bronchitis likely with left lower lobe pneumonia, placed on Unasyn.  Tolerating a regular diet.  The patient was admitted for a comprehensive rehab program.  PAST MEDICAL HISTORY:  See discharge diagnoses.  SOCIAL HISTORY:  Lives with her daughter and daughter's friend and a home health aide assistance as needed.  Functional status upon admission to rehab services was min mod assist 10 feet, 2-person handheld assistance, +2 physical assist sit to stand, total max  assist activities of daily living.  PHYSICAL EXAMINATION:  VITAL SIGNS:  Blood pressure 86/71, pulse 69, temperature 98, respirations 18. GENERAL:  This is an alert female.  She did follow some one-step commands but inconsistent, globally aphasic. LUNGS:  Clear to auscultation.  No wheeze. CARDIAC:  Regular rate and rhythm without murmur. ABDOMEN:  Soft, nontender.  Good bowel sounds.  REHABILITATION HOSPITAL COURSE:  The patient was admitted to Inpatient Rehab Services with therapies initiated on a 3-hour daily basis consisting of physical therapy, occupational therapy, speech therapy, and rehabilitation nursing.  The following issues were addressed during the patient's rehabilitation stay.  Pertaining to [name], a left posterior upper lobe infarct remained stable, maintained on aspirin therapy, she would follow up Neurology Services.  Subcutaneous Lovenox for DVT prophylaxis.  No bleeding episodes.  She remained on Namenda as well as Exelon patch for history of dementia.  Family had felt she was close to her baseline.  Blood pressures remained well controlled. Chronic renal insufficiency, baseline creatinine 1.08.  She could follow up chemistries as needed as an outpatient.  Latest creatinine 1.2.  She was tolerating a regular diet. Patient spiked a low-grade fever 01/22/2016. White blood cell count of 8000. Chest x-ray suggestive of pneumonia and placed on Levaquin 10 days.  The patient received weekly collaborative interdisciplinary team conferences to discuss estimated length of stay, family teaching, and any barriers to discharge.  She was ambulating 150 feet rolling walker, minimal assistance.  Required some increased time to complete tasks.  Negotiated a 6-inch curb with left handheld assistance.  Activities of daily living and homemaking, needed some cues for standing at the sink side to complete her bathing and dressing.  Max assist to guide her walker as well as moderate  assist to help guide the patient to shower bench.  Max assist for lower body Dressing. Family did not feel like provide necessary assistance at home with recommendations of skilled nursing facility in bed made available 01/26/2016.  DISCHARGE MEDICATIONS:  Included aspirin 325 mg p.o. daily, Lipitor 40 mg p.o. daily, Flonase 1 spray each nostril daily, Synthroid 25 mcg p.o. daily, Namenda 28 mg p.o. daily, Naphcon ophthalmic solution 1 drop both eyes daily as needed, Protonix 40 mg p.o. daily, and Exelon patch 9.5 mg Daily.Levaquin 250 mg daily 6 days and stop.Diflucan 100 mg daily for three days  DIET:  Regular.  The patient would follow with up Dr. Claudette Laws at the outpatient rehab center as directed; Dr. Roda Shutters, Neurology Services, 1 month call for appointment; and Dr. Abigail Miyamoto, medical management.     [name], P.A.   ______________________________ Erick Colace, M.D.    DA/MEDQ  D:  01/22/2016  T:  01/22/2016  Job:  [id number]  cc:   Dr. Peri Maris, MD Erick Colace, M.D.

## 2016-01-22 NOTE — Progress Notes (Signed)
Speech Language Pathology Daily Session Note  Patient Details  Name: [name] MRN: [id number] Date of Birth: 05-03-1931  Today's Date: 01/22/2016 SLP Individual Time: 1000-1050 SLP Individual Time Calculation (min): 50 min  Short Term Goals: Week 1/2  Skilled Therapeutic Interventions: Pt not feeling well per daughter. Pt nonverbal throughout session and kept eyes closed for a majority of the session unless active verbal/tactile stim. Assisted RN with facilitating meds with applesauce. Pt demonstrated prolonged oral holding and required labial stim to trigger swallow response. No overt s/s aspiration. Educated pt's daughter re: s/s aspiration. Pt's daughter was able to palpate a complete swallow on the patient. Discussed how dementia and decreased alertness increases the risk of aspiration. Encouraged the daughter if the pt remains at this level of alertness/interaction to hold noon meal. RN informed as well. Discussed faciliatation of initiation by offering familiar objects, demonstration and hand over hand.    Function:  Eating Eating   Modified Consistency Diet: No Eating Assist Level: Helper scoops food on utensil;Help with picking up utensils;Hand over hand assist;Helper brings food to mouth;Helper checks for pocketed food   Eating Set Up Assist For: Opening containers Helper Scoops Food on Utensil: Every scoop Helper Brings Food to Mouth: Every scoop   Cognition Comprehension Comprehension assist level: Understands basic less than 25% of the time/ requires cueing >75% of the time  Expression   Expression assist level: Expresses basis less than 25% of the time/requires cueing >75% of the time.  Social Interaction Social Interaction assist level: Interacts appropriately less than 25% of the time. May be withdrawn or combative.  Problem Solving Problem solving assist level: Solves basic less than 25% of the time - needs direction nearly all the time or does not effectively solve  problems and may need a restraint for safety  Memory Memory assist level: Recognizes or recalls less than 25% of the time/requires cueing greater than 75% of the time    Pain Pain Assessment Pain Assessment: PAINAD Faces Pain Scale: No hurt PAINAD (Pain Assessment in Advanced Dementia) Breathing: normal Negative Vocalization: none Facial Expression: sad, frightened, frown Body Language: relaxed Consolability: no need to console PAINAD Score: 1  Therapy/Group: Individual Therapy  Rocky CraftsKara E Vedansh Kerstetter 01/22/2016, 11:03 AM

## 2016-01-22 NOTE — Progress Notes (Signed)
Subjective/Complaints: Per daughter patient is coughing a little bit today. Felt a little bit warm. Patient with decreased appetite. No shortness of breath, no respiratory distress  ROS- cannot obtain due to Mental status   Objective: Vital Signs: Blood pressure 122/61, pulse 96, temperature 99.1 F (37.3 C), temperature source Oral, resp. rate 18, SpO2 96 %. No results found. No results found for this or any previous visit (from the past 72 hour(s)).   General: No acute distress Mood and affect are appropriate Heart: Regular rate and rhythm no rubs murmurs or extra sounds Lungs: Clear to auscultation, breathing unlabored, no rales or wheezes Abdomen: Positive bowel sounds, soft nontender to palpation, nondistended Extremities: No clubbing, cyanosis, or edema Skin: No evidence of breakdown, no evidence of rash Neurologic: alert but disoriented which is usual. Cranial nerves II through XII intact, motor strength is difficult to assess due to apraxia, does move bilateral upper and lower limbs, arthritic changes right hand     Assessment/Plan: 1. Functional deficits secondary to Right hemiparesis due to Left post frontal infarct which require 3+ hours per day of interdisciplinary therapy in a comprehensive inpatient rehab setting. Physiatrist is providing close team supervision and 24 hour management of active medical problems listed below. Physiatrist and rehab team continue to assess barriers to discharge/monitor patient progress toward functional and medical goals. FIM: Function - Bathing Position: Shower Body parts bathed by patient: Left upper leg Body parts bathed by helper: Right arm, Front perineal area, Buttocks, Right lower leg, Left lower leg, Back, Left arm, Chest, Abdomen, Right upper leg Bathing not applicable: Back Assist Level: 2 helpers  Function- Upper Body Dressing/Undressing What is the patient wearing?: Pull over shirt/dress Bra - Perfomed by patient:  Thread/unthread left bra strap Bra - Perfomed by helper: Thread/unthread right bra strap, Hook/unhook bra (pull down sports bra) Pull over shirt/dress - Perfomed by patient: Thread/unthread left sleeve, Put head through opening Pull over shirt/dress - Perfomed by helper: Pull shirt over trunk, Thread/unthread right sleeve Button up shirt - Perfomed by patient: Thread/unthread left sleeve Button up shirt - Perfomed by helper: Thread/unthread right sleeve, Thread/unthread left sleeve, Pull shirt around back, Button/unbutton shirt Assist Level: Touching or steadying assistance(Pt > 75%) (max assist) Function - Lower Body Dressing/Undressing What is the patient wearing?: Pants, Shoes, Ted Hose Position: Wheelchair/chair at [employer]- Performed by patient: Thread/unthread left pants leg Pants- Performed by helper: Thread/unthread right pants leg, Thread/unthread left pants leg, Pull pants up/down Non-skid slipper socks- Performed by helper: Don/doff right sock, Don/doff left sock Socks - Performed by helper: Don/doff right sock, Don/doff left sock Shoes - Performed by patient: Don/doff left shoe Shoes - Performed by helper: Don/doff right shoe, Don/doff left shoe TED Hose - Performed by patient: Don/doff right TED hose, Don/doff left TED hose TED Hose - Performed by helper: Don/doff right TED hose, Don/doff left TED hose Assist for lower body dressing: 2 Helpers  Function - Toileting Toileting activity did not occur: No continent bowel/bladder event Toileting steps completed by helper: Adjust clothing prior to toileting, Performs perineal hygiene, Adjust clothing after toileting  Function - Archivist transfer activity did not occur: Safety/medical concerns Toilet transfer assistive device: Walker Assist level to toilet: Total assist (Pt < 25%) Assist level from toilet: Maximal assist (Pt 25 - 49%/lift and lower)  Function - Chair/bed transfer Chair/bed transfer method: Stand  pivot Chair/bed transfer assist level: Moderate assist (Pt 50 - 74%/lift or lower) Chair/bed transfer assistive device: Armrests Chair/bed transfer  details: Visual cues/gestures for sequencing, Tactile cues for sequencing, Tactile cues for posture, Tactile cues for weight shifting, Manual facilitation for weight shifting, Verbal cues for sequencing  Function - Locomotion: Wheelchair Will patient use wheelchair at discharge?: Yes Type: Manual Max wheelchair distance: 5115ft Assist Level: Maximal assistance (Pt 25 - 49%) Wheel 50 feet with 2 turns activity did not occur: Safety/medical concerns Wheel 150 feet activity did not occur: Safety/medical concerns Turns around,maneuvers to table,bed, and toilet,negotiates 3% grade,maneuvers on rugs and over doorsills: No Function - Locomotion: Ambulation Assistive device: Walker-rolling Max distance: 150 Assist level: Touching or steadying assistance (Pt > 75%) Assist level: Touching or steadying assistance (Pt > 75%) Walk 50 feet with 2 turns activity did not occur: Safety/medical concerns Assist level: Touching or steadying assistance (Pt > 75%) Walk 150 feet activity did not occur: Safety/medical concerns Assist level: Touching or steadying assistance (Pt > 75%) Assist level: Moderate assist (Pt 50 - 74%)  Function - Comprehension Comprehension: Auditory Comprehension assist level: Understands basic less than 25% of the time/ requires cueing >75% of the time  Function - Expression Expression: Verbal Expression assist level: Expresses basis less than 25% of the time/requires cueing >75% of the time.  Function - Social Interaction Social Interaction assist level: Interacts appropriately less than 25% of the time. May be withdrawn or combative.  Function - Problem Solving Problem solving assist level: Solves basic less than 25% of the time - needs direction nearly all the time or does not effectively solve problems and may need a restraint  for safety  Function - Memory Memory assist level: Recognizes or recalls less than 25% of the time/requires cueing greater than 75% of the time Patient normally able to recall (first 3 days only): None of the above  Medical Problem List and Plan: 1.  Right sided weakness secondary to left posterior frontal lobe infarct-family training today with d/c tent for 3/25           2.  DVT Prophylaxis/Anticoagulation: Subcutaneous Lovenox. Monitor platelet counts and any signs of bleeding, last plt on 3/15 was 285K 3. Pain Management: Tylenol as needed 4. Mood/dementia: Namenda 28 mg daily,Exelon patch, moderate dementia with acute worsening of cognition due to CVA, pt had been of exelon patch several days while on acute care 5. Neuropsych: This patient is not capable of making decisions on her own behalf.  This was true even prior to CVA 6. Skin/Wound Care: Routine skin checks, 7. Fluids/Electrolytes/Nutrition: Routine I&O with follow-up chemistries, taking 90-100% meals  9. Hypothyroidism. Synthroid. TSH 4.258, continue home dose of 0.25mg  10. Chronic renal insufficiency. Baseline creatinine 1.08. Follow-up chemistries stable/normal 11. Hyperlipidemia. Lipitor 12.  Hypoalbuminemia - prostat,  13. Cough low-grade Check CBC, check chest x-ray, check UA C&S- should be ready for DC in a.m. If no change in clinical status LOS (Days) 10 A FACE TO FACE EVALUATION WAS PERFORMED  KIRSTEINS,ANDREW E 01/22/2016, 8:43 AM

## 2016-01-22 NOTE — Progress Notes (Signed)
Follow-up follow-up chest x-ray 01/22/2016 showing bilateral patchy airspace disease left greater than right. H&H is stable WBC 8000. Urinalysis negative with cultures pending. Cranial CT scan shows subacute left frontal lobe ACA infarct. There was question of a new 5 mm right cerebellar infarct versus artifact. Plan is currently to remain on full strength aspirin. We will begin Levaquin in light of chest x-ray. Patient is currently afebrile. Discussed all lab work and x-ray imaging with family. Patient currently sitting up in chair eating lunch. Plan to check the BMP today

## 2016-01-22 NOTE — Discharge Summary (Signed)
Discharge summary job [id number]

## 2016-01-23 ENCOUNTER — Inpatient Hospital Stay (HOSPITAL_COMMUNITY): Payer: Medicare Other | Admitting: Occupational Therapy

## 2016-01-23 LAB — URINE CULTURE: Culture: NO GROWTH

## 2016-01-23 MED ORDER — GUAIFENESIN 100 MG/5ML PO SOLN: 5.0000 mL | ORAL | Status: DC | PRN

## 2016-01-23 MED ORDER — SODIUM CHLORIDE 0.9 % IV SOLN
INTRAVENOUS | Status: DC
  Administered 2016-01-23: 1000 mL via INTRAVENOUS

## 2016-01-23 MED ORDER — BENZOCAINE 10 % MT GEL
Freq: Two times a day (BID) | OROMUCOSAL | Status: DC | PRN
  Filled 2016-01-23: qty 9.4

## 2016-01-23 NOTE — Progress Notes (Signed)
Occupational Therapy Session Note  Patient Details  Name: [name] MRN: [id number] Date of Birth: 03-Feb-1931  Today's Date: 01/23/2016 OT Individual Time: 1130-1200 OT Individual Time Calculation (min): 30 min    Short Term Goals: Week 1:  OT Short Term Goal 1 (Week 1): Pt will complete UB bathing with min assist and mod instructional cueing. OT Short Term Goal 1 - Progress (Week 1): Not met OT Short Term Goal 2 (Week 1): Pt will donn pullover shirt with mod assist sitting EOB or EOC. OT Short Term Goal 2 - Progress (Week 1): Met OT Short Term Goal 3 (Week 1): Pt will complete shower transfer with min assist using RW stand pivot. OT Short Term Goal 3 - Progress (Week 1): Not met OT Short Term Goal 4 (Week 1): Pt will donn pull up pants with min assist sit to stand and mod instructional cueing.   OT Short Term Goal 4 - Progress (Week 1): Not met Week 2:  OT Short Term Goal 1 (Week 2): Continue working on established LTGs set mostly at min assist level.  Skilled Therapeutic Interventions/Progress Updates:    Pt lying in bed with daughter present.  Daughter reported pt was different today and not talking or as active as previous days.  Performed PROM and mobilization to BUE.  Pt has increased tightness on RUE in end ranges of external rotation, flexion and abduction of shoulder.  NO pain noted with gentle ROM.  Performed mouth care using suction.  Pt had upper dentures with a lot of food left from meal.  She was very tender on her upper gum line.  Informed MD who rounded during session.  Pt not talking in beginning but verbalized one word sentences to OT at the end.  Pt. Left with call bell, phone and all needs in reach.    Therapy Documentation Precautions:  Precautions Precautions: Fall Precaution Comments: hx of dementia, expressive delays, right in-attention and slight weakness Restrictions Weight Bearing Restrictions: No    Vital Signs: Therapy Vitals Temp: 100.3 F (37.9  C) Pulse Rate: 100 Resp: 19 BP: 116/62 mmHg Patient Position (if appropriate): Lying Oxygen Therapy SpO2: 91 % O2 Device: Not Delivered Pain:  Upper gum line with facial grimaces         See Function Navigator for Current Functional Status.   Therapy/Group: Individual Therapy  Lisa Roca 01/23/2016, 6:06 PM

## 2016-01-23 NOTE — Progress Notes (Signed)
[name] is a 82 y.o. female 04/18/1931 [id number]  Subjective: Sedate (but awake, blank stare) and largely nonverbal for past 48h per family - family reports similar behavior last week immediately following CVA but notes days interim pt would follow commands and interact with staff/family more than now - "she's just different" - also notes increase chest congestion and cough past 2 days  Objective: Vital signs in last 24 hours: Temp:  [98.1 F (36.7 C)-100.8 F (38.2 C)] 100.8 F (38.2 C) (03/25 0544) Pulse Rate:  [97-114] 98 (03/25 0544) Resp:  [18-20] 18 (03/25 0544) BP: (116-144)/(63-70) 144/70 mmHg (03/25 0544) SpO2:  [92 %-96 %] 93 % (03/25 0544) Weight change:  Last BM Date: 01/21/16  Intake/Output from previous day: 03/24 0701 - 03/25 0700 In: 330 [P.O.:330] Out: 150 [Urine:150]  Physical Exam General: Minimally interactive but eyes open and awake (nonfocused), does not follow commands, apparent delay in comprehension - occ congested cough Mouth - no ulcerations - dry sputum and thick saliva caking oral cavity    Lungs: decreased effort but no inc WOB. Diminished air mvmt B bases, occ crackles L>R, no wheezes. Cardiovascular: Regular rate and rhythm, no edema   Lab Results: BMET    Component Value Date/Time   NA 137 01/22/2016 1409   K 3.5 01/22/2016 1409   CL 104 01/22/2016 1409   CO2 20* 01/22/2016 1409   GLUCOSE 183* 01/22/2016 1409   BUN 11 01/22/2016 1409   CREATININE 1.09* 01/22/2016 1409   CALCIUM 8.2* 01/22/2016 1409   GFRNONAA 46* 01/22/2016 1409   GFRAA 53* 01/22/2016 1409   CBC    Component Value Date/Time   WBC 8.0 01/22/2016 0928   RBC 4.30 01/22/2016 0928   HGB 12.8 01/22/2016 0928   HCT 40.4 01/22/2016 0928   PLT 373 01/22/2016 0928   MCV 94.0 01/22/2016 0928   MCH 29.8 01/22/2016 0928   MCHC 31.7 01/22/2016 0928   RDW 13.7 01/22/2016 0928   LYMPHSABS 1.8 01/15/2016 1839   MONOABS 1.2* 01/15/2016 1839   EOSABS 0.1 01/15/2016 1839    BASOSABS 0.1 01/15/2016 1839   CBG's (last 3):  No results for input(s): GLUCAP in the last 72 hours. LFT's Lab Results  Component Value Date   ALT 18 01/13/2016   AST 24 01/13/2016   ALKPHOS 62 01/13/2016   BILITOT 0.6 01/13/2016    Studies/Results: Dg Chest 2 View  01/22/2016  CLINICAL DATA:  Cough EXAM: CHEST  2 VIEW COMPARISON:  01/10/2016 FINDINGS: Moderate cardiomegaly. Heterogeneous opacities throughout the left lung have developed. Patchy density towards the right apex has also developed. Low volumes. No pneumothorax. Small pleural effusions. Minimal lower thoracic wedge compression deformity of indeterminate age. IMPRESSION: Bilateral patchy airspace disease left greater than right. Bilateral pleural effusions. Followup PA and lateral chest X-ray is recommended in 3-4 weeks following trial of antibiotic therapy to ensure resolution and exclude underlying malignancy. Minimal wedge compression deformity in the lower thoracic spine. Electronically Signed   By: Jolaine Click M.D.   On: 01/22/2016 13:09   Ct Head Wo Contrast  01/22/2016  CLINICAL DATA:  Stroke.  Weakness. EXAM: CT HEAD WITHOUT CONTRAST TECHNIQUE: Contiguous axial images were obtained from the base of the skull through the vertex without intravenous contrast. COMPARISON:  Head MRI 01/08/2016 and CT 01/07/2016 FINDINGS: Hypoattenuation is again seen in the posterior left frontal lobe in the ACA territory corresponding to the previously demonstrated infarct, with the low density less pronounced than on the prior CT.  There is a 5 mm hypodensity in the inferior right cerebellum (series 2, image 6) which was not clearly present on the prior studies. A chronic left pontine infarct is again noted. There is moderate cerebral atrophy. No acute intracranial hemorrhage, mass, midline shift, or extra-axial fluid collection is seen. Moderate chronic small vessel ischemic changes are noted in the cerebral white matter. Prior bilateral  cataract extraction is noted. The visualized paranasal sinuses are clear. There is a small right mastoid effusion. No acute osseous abnormality is identified. Calcified atherosclerosis is noted at the skull base. IMPRESSION: 1. Subacute left frontal lobe ACA infarct. 2. Questionable new 5 mm right cerebellar infarct versus artifact. 3. No acute intracranial hemorrhage. 4. Moderate chronic small vessel ischemic disease and cerebral atrophy. Electronically Signed   By: Sebastian AcheAllen  Grady M.D.   On: 01/22/2016 13:28    Medications:  I have reviewed the patient's current medications. Scheduled Medications: . aspirin  300 mg Rectal Daily   Or  . aspirin  325 mg Oral Daily  . atorvastatin  40 mg Oral q1800  . enoxaparin (LOVENOX) injection  40 mg Subcutaneous Q24H  . fluticasone  1 spray Each Nare Daily  . levofloxacin  250 mg Oral Daily  . levothyroxine  25 mcg Oral QAC breakfast  . memantine  28 mg Oral Daily  . pantoprazole  40 mg Oral Daily  . rivastigmine  9.5 mg Transdermal Q24H   PRN Medications: acetaminophen, naphazoline-pheniramine, ondansetron **OR** ondansetron (ZOFRAN) IV, senna-docusate, sorbitol  Assessment/Plan: Active Problems:   CVA (cerebral infarction)   Dementia   Left lower lobe pneumonia   Chronic renal insufficiency  1. Right sided weakness secondary to left posterior frontal lobe infarct, ?new cerebellar infarct on head CT 3/24 - continue medical mgmt of CVA risk factors and IP rehab as ongoing 2. DVT Prophylaxis/Anticoagulation: Subcutaneous Lovenox. Monitor platelet counts and any signs of bleeding 3. Pain Management: Tylenol as needed 4. Mood/dementia: Namenda 28 mg daily, Exelon patch, moderate dementia with acute worsening of cognition due to CVA and PNA; note pt had been off exelon patch several days while on acute care 5. Neuropsych: This patient is not capable of making decisions on her own behalf. This was true even prior to CVA 6. Skin/Wound Care: Routine  skin checks, 7. Fluids/Electrolytes/Nutrition: Routine I&O with follow-up chemistries, taking 90-100% meals 8. Hypothyroidism. Synthroid. TSH 4.258, continue home dose of 0.25mg  9. Chronic renal insufficiency. Baseline creatinine 1.08. Follow-up chemistries stable/normal 10. Hyperlipidemia. Lipitor 11. Hypoalbuminemia - prostat 12. Pneumonia L>R per CXR 3/24 - a/w cough symptoms and decreased LOC- Begun Levaquin (day 2 today)for same - normal WBC and afebrile; add IVF and robitussin for symptoms ?ready for DC Mon 3/27 if improvement in clinical status  Length of stay, days: 11   Yon Schiffman A. Felicity CoyerLeschber, MD 01/23/2016, 9:59 AM

## 2016-01-24 ENCOUNTER — Inpatient Hospital Stay (HOSPITAL_COMMUNITY): Payer: Medicare Other | Admitting: Occupational Therapy

## 2016-01-24 NOTE — Progress Notes (Signed)
[name] is a 82 y.o. female [birth year] [id number]  Subjective: Remains largely nonverbal (since Fri 3/24 per family) -but more alert and less congested - taking po well  Objective: Vital signs in last 24 hours: Temp:  [99.2 F (37.3 C)-100.3 F (37.9 C)] 99.2 F (37.3 C) (03/26 0530) Pulse Rate:  [85-100] 85 (03/26 0530) Resp:  [16-19] 16 (03/26 0530) BP: (99-116)/(50-62) 99/50 mmHg (03/26 0530) SpO2:  [91 %-93 %] 93 % (03/26 0530) Weight change:  Last BM Date: 01/22/16  Intake/Output from previous day: 03/25 0701 - 03/26 0700 In: 50 [P.O.:50] Out: -   Physical Exam General: nonverbal but eyes open and awake; does not follow commands but more alert today Mouth - no ulcerations - MMM    Lungs: decreased effort but no inc WOB. Diminished air mvmt B bases, no crackles or wheezes. Cardiovascular: Regular rate and rhythm, no edema   Lab Results: BMET    Component Value Date/Time   NA 137 01/22/2016 1409   K 3.5 01/22/2016 1409   CL 104 01/22/2016 1409   CO2 20* 01/22/2016 1409   GLUCOSE 183* 01/22/2016 1409   BUN 11 01/22/2016 1409   CREATININE 1.09* 01/22/2016 1409   CALCIUM 8.2* 01/22/2016 1409   GFRNONAA 46* 01/22/2016 1409   GFRAA 53* 01/22/2016 1409   CBC    Component Value Date/Time   WBC 8.0 01/22/2016 0928   RBC 4.30 01/22/2016 0928   HGB 12.8 01/22/2016 0928   HCT 40.4 01/22/2016 0928   PLT 373 01/22/2016 0928   MCV 94.0 01/22/2016 0928   MCH 29.8 01/22/2016 0928   MCHC 31.7 01/22/2016 0928   RDW 13.7 01/22/2016 0928   LYMPHSABS 1.8 01/15/2016 1839   MONOABS 1.2* 01/15/2016 1839   EOSABS 0.1 01/15/2016 1839   BASOSABS 0.1 01/15/2016 1839   CBG's (last 3):  No results for input(s): GLUCAP in the last 72 hours. LFT's Lab Results  Component Value Date   ALT 18 01/13/2016   AST 24 01/13/2016   ALKPHOS 62 01/13/2016   BILITOT 0.6 01/13/2016    Studies/Results: Dg Chest 2 View  01/22/2016  CLINICAL DATA:  Cough EXAM: CHEST  2 VIEW  COMPARISON:  01/10/2016 FINDINGS: Moderate cardiomegaly. Heterogeneous opacities throughout the left lung have developed. Patchy density towards the right apex has also developed. Low volumes. No pneumothorax. Small pleural effusions. Minimal lower thoracic wedge compression deformity of indeterminate age. IMPRESSION: Bilateral patchy airspace disease left greater than right. Bilateral pleural effusions. Followup PA and lateral chest X-ray is recommended in 3-4 weeks following trial of antibiotic therapy to ensure resolution and exclude underlying malignancy. Minimal wedge compression deformity in the lower thoracic spine. Electronically Signed   By: Jolaine ClickArthur  Hoss M.D.   On: 01/22/2016 13:09   Ct Head Wo Contrast  01/22/2016  CLINICAL DATA:  Stroke.  Weakness. EXAM: CT HEAD WITHOUT CONTRAST TECHNIQUE: Contiguous axial images were obtained from the base of the skull through the vertex without intravenous contrast. COMPARISON:  Head MRI 01/08/2016 and CT 01/07/2016 FINDINGS: Hypoattenuation is again seen in the posterior left frontal lobe in the ACA territory corresponding to the previously demonstrated infarct, with the low density less pronounced than on the prior CT. There is a 5 mm hypodensity in the inferior right cerebellum (series 2, image 6) which was not clearly present on the prior studies. A chronic left pontine infarct is again noted. There is moderate cerebral atrophy. No acute intracranial hemorrhage, mass, midline shift, or extra-axial fluid collection is  seen. Moderate chronic small vessel ischemic changes are noted in the cerebral white matter. Prior bilateral cataract extraction is noted. The visualized paranasal sinuses are clear. There is a small right mastoid effusion. No acute osseous abnormality is identified. Calcified atherosclerosis is noted at the skull base. IMPRESSION: 1. Subacute left frontal lobe ACA infarct. 2. Questionable new 5 mm right cerebellar infarct versus artifact. 3. No  acute intracranial hemorrhage. 4. Moderate chronic small vessel ischemic disease and cerebral atrophy. Electronically Signed   By: Sebastian Ache M.D.   On: 01/22/2016 13:28    Medications:  I have reviewed the patient's current medications. Scheduled Medications: . aspirin  300 mg Rectal Daily   Or  . aspirin  325 mg Oral Daily  . atorvastatin  40 mg Oral q1800  . enoxaparin (LOVENOX) injection  40 mg Subcutaneous Q24H  . fluticasone  1 spray Each Nare Daily  . levofloxacin  250 mg Oral Daily  . levothyroxine  25 mcg Oral QAC breakfast  . memantine  28 mg Oral Daily  . pantoprazole  40 mg Oral Daily  . rivastigmine  9.5 mg Transdermal Q24H   PRN Medications: acetaminophen, benzocaine, guaiFENesin, naphazoline-pheniramine, ondansetron **OR** ondansetron (ZOFRAN) IV, senna-docusate, sorbitol  Assessment/Plan: Active Problems:   CVA (cerebral infarction)   Dementia   Left lower lobe pneumonia   Chronic renal insufficiency  1. Right sided weakness secondary to left posterior frontal lobe infarct, ?new cerebellar infarct on head CT 3/24 - continue medical mgmt of CVA risk factors and IP rehab as ongoing 2. DVT Prophylaxis/Anticoagulation: Subcutaneous Lovenox. Monitor platelet counts and any signs of bleeding 3. Pain Management: Tylenol as needed 4. Mood/dementia: Namenda 28 mg daily, Exelon patch, moderate dementia with acute worsening of cognition due to CVA and PNA; note pt had been off exelon patch several days while on acute care 5. Neuropsych: This patient is not capable of making decisions on her own behalf. This was true even prior to CVA 6. Skin/Wound Care: Routine skin checks, 7. Fluids/Electrolytes/Nutrition: Routine I&O with follow-up chemistries, taking 90-100% meals 8. Hypothyroidism. Synthroid. TSH 4.258, continue home dose of 0.25mg  9. Chronic renal insufficiency. Baseline creatinine 1.08. Follow-up chemistries stable/normal 10. Hyperlipidemia. Lipitor 11.  Hypoalbuminemia - prostat 12. Pneumonia L>R per CXR 3/24 - a/w cough symptoms and decreased LOC- Begun Levaquin on 3/24for same - normal WBC and afebrile; added IVF <24h and robitussin for symptoms on 3/25 - ?seems more alert but still nonverbal ?ready for DC Mon 3/27 if continued improvement in clinical status  Length of stay, days: 12   Donal Lynam A. Felicity Coyer, MD 01/24/2016, 9:53 AM

## 2016-01-24 NOTE — Progress Notes (Signed)
Occupational Therapy Session Note  Patient Details  Name: Kayla SantiagoMary C Schroeder MRN: [id number] Date of Birth: 1931-07-08  Today's Date: 01/24/2016 OT Individual Time: 1133-1202 OT Individual Time Calculation (min): 29 min    Short Term Goals: Week 2:  OT Short Term Goal 1 (Week 2): Continue working on established LTGs set mostly at min assist level.  Skilled Therapeutic Interventions/Progress Updates:    Performed oral hygiene on pt with suction brush total assist.  Noted pt with white crusty residue on her tongue as well that would not come off.   With completion of oral hygiene she transitioned to sitting at the EOB.  Mod assist to complete and initiate rolling to the left side as well as for transition from sidelying to sitting.  Once in sitting worked on donning pants.  Therapist assisted with donning right pants leg and then she was able to lift her left leg with max demonstrational cueing for therapist to place pants over the foot.  She then pulled them up to her knees with mod assist. Total assist +2 (pt 35%) for standing to pull them over her hips.  Increased posterior lean noted in standing.  Pt ambulated with RW and hand held assist out in the hallway and up to the water fountain with mod facilitation and weightshifting.  Approximate distance of 40 ft completed.  She was rolled in wheelchair back to the room where she was left in the wheelchair with safety belt in place and numerous family members present.    Therapy Documentation Precautions:  Precautions Precautions: Fall Precaution Comments: hx of dementia, expressive delays, right in-attention and slight weakness Restrictions Weight Bearing Restrictions: No  Pain: Pain Assessment Pain Assessment: No/denies pain PAINAD (Pain Assessment in Advanced Dementia) Breathing: normal ADL: See Function Navigator for Current Functional Status.   Therapy/Group: Individual Therapy  Nadyne Gariepy OTR/L 01/24/2016, 12:21 PM

## 2016-01-25 ENCOUNTER — Inpatient Hospital Stay (HOSPITAL_COMMUNITY): Payer: Medicare Other | Admitting: Speech Pathology

## 2016-01-25 ENCOUNTER — Inpatient Hospital Stay (HOSPITAL_COMMUNITY): Payer: Medicare Other | Admitting: Occupational Therapy

## 2016-01-25 ENCOUNTER — Inpatient Hospital Stay (HOSPITAL_COMMUNITY): Payer: Medicare Other | Admitting: Physical Therapy

## 2016-01-25 MED ORDER — MAGIC MOUTHWASH W/LIDOCAINE
10.0000 mL | Freq: Four times a day (QID) | ORAL | Status: DC
Start: 2016-01-25 — End: 2016-01-26
  Administered 2016-01-25 – 2016-01-26 (×6): 10 mL via ORAL
  Filled 2016-01-25 (×5): qty 10

## 2016-01-25 MED ORDER — FLUCONAZOLE 100 MG PO TABS
100.0000 mg | ORAL_TABLET | Freq: Every day | ORAL | Status: DC
  Administered 2016-01-25 – 2016-01-26 (×2): 100 mg via ORAL
  Filled 2016-01-25 (×2): qty 1

## 2016-01-25 NOTE — Progress Notes (Signed)
Social Work  Discharge Note  The overall goal for the admission was met for:   Discharge location: No-[facility]-SNF  Length of Stay: Yes-14 DAYS  Discharge activity level: Yes-MIN/MOD LEVEL  Home/community participation: Yes  Services provided included: MD, RD, PT, OT, SLP, RN, CM, TR, Pharmacy and SW  Financial Services: Private Insurance: [facility]  Follow-up services arranged: Other: NHP  Comments (or additional information):DAUGHTER REALIZES AT THIS POINT SHE CAN NOT PROVIDE THE CARE PT REQUIRES, NEEDS TO BE HIGHER LEVEL. HOPES AFTER MORE THERAPIES CAN RETURN HOME WITH HER.  Patient/Family verbalized understanding of follow-up arrangements: Yes  Individual responsible for coordination of the follow-up plan: JANE-DAUGHTER  Confirmed correct DME delivered: Elease Hashimoto 01/25/2016    Elease Hashimoto

## 2016-01-25 NOTE — Progress Notes (Signed)
Physical Therapy Note  Patient Details  Name: Lawrence SantiagoMary C Glade MRN: [id number] Date of Birth: 06/19/1931 Today's Date: 01/25/2016    Time: [phone] 48 minutes  1:1 No signs/symptoms of pain. Pt still non verbal throughout session.  Pt's daughter present and observed PT session.  Sit to stand transfers multiple attempts with mod A, pt still with Rt and posterior lean.  Gait 30' with RW with min A for balance and min A for to advance R LE, manual facilitation for Lt weight shift. Pt takes 8 minutes to gait 30'.  Gait with railing in hallway with increased cadence (4 minutes for 30'), pt continues to require facilitation for wt shifts and assist to advance R LE.  Nu step 5 mins followed by 3 mins level 1 for AAROM of bilat LEs. Pt requires mod A for stand pivot transfer to nu step due to impaired balance and initiation. W/c mobility with max hand over hand assist x 75' in controlled environment.  Overall treatment limited by decreased attention, poor initiation, impaired awareness.   DONAWERTH,KAREN 01/25/2016, 10:48 AM

## 2016-01-25 NOTE — Progress Notes (Signed)
Social Work Patient ID: [name], female   DOB: May 24, 1931, 82 y.o.   MRN: [id number] Met with daughter how has decided to pursue NHP hopefully for a short time until she can provide the care pt requires. She feels she needs pt a little bit better and more mobile. Have faxed FL2 to [facility] close to pt's home. Have spoken with [facility] and she will have RN look at and get back with this worker.

## 2016-01-25 NOTE — Progress Notes (Signed)
Subjective/Complaints: Appetite improving however patient has mild soreness. Less coughing. Appreciate internal medicine notes  ROS- cannot obtain due to Mental status   Objective: Vital Signs: Blood pressure 121/68, pulse 80, temperature 98.3 F (36.8 C), temperature source Oral, resp. rate 18, SpO2 95 %. No results found. Results for orders placed or performed during the hospital encounter of 01/12/16 (from the past 72 hour(s))  Urinalysis, Routine w reflex microscopic (not at [facility])     Status: Abnormal   Collection Time: 01/22/16 11:21 AM  Result Value Ref Range   Color, Urine YELLOW YELLOW   APPearance CLEAR CLEAR   Specific Gravity, Urine 1.019 1.005 - 1.030   pH 6.0 5.0 - 8.0   Glucose, UA NEGATIVE NEGATIVE mg/dL   Hgb urine dipstick TRACE (A) NEGATIVE   Bilirubin Urine NEGATIVE NEGATIVE   Ketones, ur NEGATIVE NEGATIVE mg/dL   Protein, ur NEGATIVE NEGATIVE mg/dL   Nitrite NEGATIVE NEGATIVE   Leukocytes, UA NEGATIVE NEGATIVE  Urine culture     Status: None   Collection Time: 01/22/16 11:21 AM  Result Value Ref Range   Specimen Description URINE, CATHETERIZED    Special Requests NONE    Culture NO GROWTH 1 DAY    Report Status 01/23/2016 FINAL   Urine microscopic-add on     Status: Abnormal   Collection Time: 01/22/16 11:21 AM  Result Value Ref Range   Squamous Epithelial / LPF 0-5 (A) NONE SEEN   WBC, UA 0-5 0 - 5 WBC/hpf   RBC / HPF 0-5 0 - 5 RBC/hpf   Bacteria, UA FEW (A) NONE SEEN  Basic metabolic panel     Status: Abnormal   Collection Time: 01/22/16  2:09 PM  Result Value Ref Range   Sodium 137 135 - 145 mmol/L   Potassium 3.5 3.5 - 5.1 mmol/L   Chloride 104 101 - 111 mmol/L   CO2 20 (L) 22 - 32 mmol/L   Glucose, Bld 183 (H) 65 - 99 mg/dL   BUN 11 6 - 20 mg/dL   Creatinine, Ser 1.09 (H) 0.44 - 1.00 mg/dL   Calcium 8.2 (L) 8.9 - 10.3 mg/dL   GFR calc non Af Amer 46 (L) >60 mL/min   GFR calc Af Amer 53 (L) >60 mL/min    Comment: (NOTE) The eGFR has  been calculated using the CKD EPI equation. This calculation has not been validated in all clinical situations. eGFR's persistently <60 mL/min signify possible Chronic Kidney Disease.    Anion gap 13 5 - 15     General: No acute distress Mood and affect are appropriate Heart: Regular rate and rhythm no rubs murmurs or extra sounds Lungs: Clear to auscultation, breathing unlabored, no rales or wheezes Abdomen: Positive bowel sounds, soft nontender to palpation, nondistended Extremities: No clubbing, cyanosis, or edema Skin: No evidence of breakdown, no evidence of rash Neurologic: alert but disoriented which is usual. Cranial nerves II through XII intact, motor strength is difficult to assess due to apraxia, does move bilateral upper and lower limbs, arthritic changes right hand     Assessment/Plan: 1. Functional deficits secondary to Right hemiparesis due to Left post frontal infarct which require 3+ hours per day of interdisciplinary therapy in a comprehensive inpatient rehab setting. Physiatrist is providing close team supervision and 24 hour management of active medical problems listed below. Physiatrist and rehab team continue to assess barriers to discharge/monitor patient progress toward functional and medical goals. FIM: Function - Bathing Position: Shower Body parts bathed by  patient: Left upper leg Body parts bathed by helper: Right arm, Front perineal area, Buttocks, Right lower leg, Left lower leg, Back, Left arm, Chest, Abdomen, Right upper leg, Left upper leg Bathing not applicable: Back Assist Level: 2 helpers  Function- Upper Body Dressing/Undressing What is the patient wearing?: Pull over shirt/dress Bra - Perfomed by patient: Thread/unthread left bra strap Bra - Perfomed by helper: Thread/unthread right bra strap, Hook/unhook bra (pull down sports bra) Pull over shirt/dress - Perfomed by patient: Thread/unthread left sleeve, Put head through opening Pull over  shirt/dress - Perfomed by helper: Thread/unthread right sleeve, Thread/unthread left sleeve, Put head through opening, Pull shirt over trunk Button up shirt - Perfomed by patient: Thread/unthread left sleeve Button up shirt - Perfomed by helper: Thread/unthread right sleeve, Thread/unthread left sleeve, Pull shirt around back, Button/unbutton shirt Assist Level: Touching or steadying assistance(Pt > 75%) (max assist) Function - Lower Body Dressing/Undressing What is the patient wearing?: Pants Position: Wheelchair/chair at sink Pants- Performed by patient: Thread/unthread left pants leg Pants- Performed by helper: Thread/unthread right pants leg, Thread/unthread left pants leg, Pull pants up/down Non-skid slipper socks- Performed by helper: Don/doff right sock, Don/doff left sock Socks - Performed by helper: Don/doff right sock, Don/doff left sock Shoes - Performed by patient: Don/doff left shoe Shoes - Performed by helper: Don/doff right shoe, Don/doff left shoe TED Hose - Performed by patient: Don/doff right TED hose, Don/doff left TED hose TED Hose - Performed by helper: Don/doff right TED hose, Don/doff left TED hose Assist for lower body dressing: 2 Helpers  Function - Toileting Toileting activity did not occur: No continent bowel/bladder event Toileting steps completed by helper: Adjust clothing prior to toileting, Performs perineal hygiene, Adjust clothing after toileting  Function - Air cabin crew transfer activity did not occur: Safety/medical concerns Toilet transfer assistive device: Walker Assist level to toilet: Total assist (Pt < 25%) Assist level from toilet: Maximal assist (Pt 25 - 49%/lift and lower)  Function - Chair/bed transfer Chair/bed transfer method: Stand pivot Chair/bed transfer assist level: Maximal assist (Pt 25 - 49%/lift and lower) Chair/bed transfer assistive device: Armrests Chair/bed transfer details: Visual cues/gestures for sequencing,  Tactile cues for sequencing, Tactile cues for posture, Tactile cues for weight shifting, Manual facilitation for weight shifting, Verbal cues for sequencing  Function - Locomotion: Wheelchair Will patient use wheelchair at discharge?: Yes Type: Manual Max wheelchair distance: 47f Assist Level: Maximal assistance (Pt 25 - 49%) Wheel 50 feet with 2 turns activity did not occur: Safety/medical concerns Wheel 150 feet activity did not occur: Safety/medical concerns Turns around,maneuvers to table,bed, and toilet,negotiates 3% grade,maneuvers on rugs and over doorsills: No Function - Locomotion: Ambulation Assistive device: Walker-rolling Max distance: 150 Assist level: Touching or steadying assistance (Pt > 75%) Assist level: Touching or steadying assistance (Pt > 75%) Walk 50 feet with 2 turns activity did not occur: Safety/medical concerns Assist level: Touching or steadying assistance (Pt > 75%) Walk 150 feet activity did not occur: Safety/medical concerns Assist level: Touching or steadying assistance (Pt > 75%) Assist level: Moderate assist (Pt 50 - 74%)  Function - Comprehension Comprehension: Auditory Comprehension assist level: Understands basic less than 25% of the time/ requires cueing >75% of the time  Function - Expression Expression: Verbal Expression assist level: Expresses basis less than 25% of the time/requires cueing >75% of the time.  Function - Social Interaction Social Interaction assist level: Interacts appropriately less than 25% of the time. May be withdrawn or combative.  Function - Problem  Solving Problem solving assist level: Solves basic less than 25% of the time - needs direction nearly all the time or does not effectively solve problems and may need a restraint for safety  Function - Memory Memory assist level: Recognizes or recalls less than 25% of the time/requires cueing greater than 75% of the time Patient normally able to recall (first 3 days  only): None of the above  Medical Problem List and Plan: 1.  Right sided weakness secondary to left posterior frontal lobe infarct-stable for discharge to SNF           2.  DVT Prophylaxis/Anticoagulation: Subcutaneous Lovenox. Monitor platelet counts and any signs of bleeding, last plt on 3/15 was 285K 3. Pain Management: Tylenol as needed 4. Mood/dementia: Namenda 28 mg daily,Exelon patch, moderate dementia with acute worsening of cognition due to CVA, pt had been of exelon patch several days while on acute care 5. Neuropsych: This patient is not capable of making decisions on her own behalf.  This was true even prior to CVA 6. Skin/Wound Care: Routine skin checks, 7. Fluids/Electrolytes/Nutrition: Routine I&O with follow-up chemistries, taking 90-100% meals appetite improved but has mild soreness limiting intake 9. Hypothyroidism. Synthroid. TSH 4.258, continue home dose of 0.61m 10. Chronic renal insufficiency. Baseline creatinine 1.08. Follow-up chemistries stable/normal 11. Hyperlipidemia. Lipitor 12.  Hypoalbuminemia - prostat,  13.hospital-acquired pneumonia, may be aspiration related. She's had recent CVA plus chronic dementia Responding to Levaquin. Afebrile 36 hours. 14  Thrush, po diflucan for 5d LOS (Days) 13 A FACE TO FACE EVALUATION WAS PERFORMED  KIRSTEINS,ANDREW E 01/25/2016, 9:32 AM

## 2016-01-25 NOTE — Progress Notes (Signed)
Social Work Patient ID: Kayla SantiagoMary C Guadiana, female   DOB: [birth year], 82 y.o.   MRN: [id number] Spoke with [facility] who reports they can offer pt a bed for tomorrow. Have contacted daughter to let her know and plan for transfer tomorrow. Daughter to contact [facility] and set up time in the ma for paperwork  And then have pt transfer to facility tomorrow. Dan-PA aware and team aware, MD texted.

## 2016-01-25 NOTE — Progress Notes (Signed)
Speech Language Pathology Discharge Summary  Patient Details  Name: [name] MRN: [id number] Date of Birth: [birth year]  Today's Date: 01/25/2016 SLP Individual Time: 0803-0900 SLP Individual Time Calculation (min): 57 min   Skilled Therapeutic Interventions:  Pt was seen for skilled ST targeting cognitive goals.  Pt in bed asleep upon arrival but easily awakened to voice and light touch.  Pt was transferred to wheelchair to maximize attention and alertness during therapeutic tasks.  Pt initially required hand over hand assist for sequencing and task initiation while feeding herself breakfast; however, as task progressed SLP was able to fade cues to max assist verbal and tactile cues.  Pt was able to sustain her attention to task for a maximum of 1 minute intervals with max assist verbal cues for redirection in a highly controlled set up of environment.  Intermittent oral holding noted due to decreased attention to bolus which cleared with set up and max verbal cues for initiation of liquid wash.  Pt's daughter was present towards the end of today's therapy session, SLP provided skilled education regarding pt's current cognitive limitations and their impact on her ability to complete basic, familiar self care tasks safely.  SLP also provided skilled strategy instruction regarding distraction management techniques to better maximize pt's attention to tasks.  Pt's daughter verbalized understanding.  Pt was left in chair with daughter and son in law at bedside.      Patient has met 3 of 5 long term goals.  Patient to discharge at overall Max level.  Reasons goals not met: max assist needed for sustained attention ot task, total assist needed for orientation   Clinical Impression/Discharge Summary:  Pt made limited and inconsistent gains while inpatient due to moderate cognitive-linguistic deficits which continue to be exacerbated by underlying dementia.  Pt is currently on a regular diet and thin  liquids and her ability to tolerate said diet is largely dependent on cognitive functioning.  Recommend allowing pt to eat as minimally restricting a diet as possible to maximize intake if she is awake and alert but hold POs if she becomes lethargic.  Pt requires max assist during most basic, familiar tasks due to very poor task initiation, organization, and sequencing, as well as decreased sustained attention to tasks and little to no intellectual awareness of deficits.  Pt has not been oriented to place, date, or situation while inpatient despite frequent repetition of information and max-total assist verbal cues.  Pt with very limited verbal output and her expression is characterized by simple, 1 word responses.  Pt also continues to exhibit difficulty following 1-step commands.  Communication deficits appear to be more directly correlated to cognitive and motor planning impairment versus true CVA etiology.  Pt is discharging to SNF where it is recommended that she receive ongoing ST services in order to maximize functional independence and reduce burden of care prior to discharge home.    Care Partner:  Caregiver Able to Provide Assistance: Other (comment) (SNF)  Type of Caregiver Assistance:  (SNF)  Recommendation:  Skilled Nursing facility  Rationale for SLP Follow Up: Maximize functional communication;Maximize cognitive function and independence;Reduce caregiver burden   Equipment: none recommended by SLP    Reasons for discharge: Discharged from hospital   Patient/Family Agrees with Progress Made and Goals Achieved: Yes   Function:  Eating Eating   Modified Consistency Diet: No Eating Assist Level: Helper scoops food on utensil;Help with picking up utensils;Hand over hand assist;Helper brings food to mouth;Helper checks for pocketed  food   Eating Set Up Assist For: Opening containers Helper Scoops Food on Utensil: Occasionally Helper Brings Food to Mouth: Occasionally    Cognition Comprehension Comprehension assist level: Understands basic 25 - 49% of the time/ requires cueing 50 - 75% of the time  Expression   Expression assist level: Expresses basic 25 - 49% of the time/requires cueing 50 - 75% of the time. Uses single words/gestures.  Social Interaction Social Interaction assist level: Interacts appropriately 25 - 49% of time - Needs frequent redirection.  Problem Solving Problem solving assist level: Solves basic 25 - 49% of the time - needs direction more than half the time to initiate, plan or complete simple activities  Memory Memory assist level: Recognizes or recalls less than 25% of the time/requires cueing greater than 75% of the time   Emilio Math 01/25/2016, 12:15 PM

## 2016-01-25 NOTE — Plan of Care (Signed)
Problem: RH Cognition - SLP Goal: RH LTG Patient will demonstrate orientation with cues LTG: Patient will demonstrate orientation to person/place/time/situation with cues (SLP)  Outcome: Not Met (add Reason) Total assist to reorient to place and situation   Problem: RH Attention Goal: LTG Patient will demonstrate focused/sustained (SLP) LTG: Patient will demonstrate focused/sustained/selective/alternating/divided attention during cognitive/linguistic activities in specific environment with assist for # of minutes (SLP)  Outcome: Not Met (add Reason) Max assist to sustain attention to tasks

## 2016-01-25 NOTE — Progress Notes (Signed)
Occupational Therapy Session Note  Patient Details  Name: [name] MRN: [id number] Date of Birth: 02/09/1931  Today's Date: 01/25/2016 OT Individual Time:  - 1500-1530  (30 min)      Short Term Goals: Week 1:  OT Short Term Goal 1 (Week 1): Pt will complete UB bathing with min assist and mod instructional cueing. OT Short Term Goal 1 - Progress (Week 1): Not met OT Short Term Goal 2 (Week 1): Pt will donn pullover shirt with mod assist sitting EOB or EOC. OT Short Term Goal 2 - Progress (Week 1): Met OT Short Term Goal 3 (Week 1): Pt will complete shower transfer with min assist using RW stand pivot. OT Short Term Goal 3 - Progress (Week 1): Not met OT Short Term Goal 4 (Week 1): Pt will donn pull up pants with min assist sit to stand and mod instructional cueing.   OT Short Term Goal 4 - Progress (Week 1): Not met Week 2:  OT Short Term Goal 1 (Week 2): Continue working on established LTGs set mostly at min assist level. Week 3:     Skilled Therapeutic Interventions/Progress Updates:    Pt lying in bed upon OT arrival.  She opened eyes when her name ws called and turned her head.  Engaged in rolling with pt needing total assist to bend knees and hips. Pt remained in supine position due to stiffness.  Engaged in grooming tasks with total assist.  Pt answered yes/no questions but did not engage in any more conversation.  Pt.left in room with all items in reach.   Therapy Documentation Precautions:  Precautions Precautions: Fall Precaution Comments: hx of dementia, expressive delays, right in-attention and slight weakness Restrictions Weight Bearing Restrictions: No    Pain:  None indicated            See Function Navigator for Current Functional Status.   Therapy/Group: Individual Therapy  ,  J 01/25/2016, 8:01 AM  

## 2016-01-25 NOTE — NC FL2 (Signed)
[place] MEDICAID [id number] LEVEL OF CARE SCREENING TOOL     IDENTIFICATION  Patient Name: [name] Birthdate: August 10, 1931 Sex: female Admission Date (Current Location): 01/12/2016  [facility] and [place] Number:  [employer] and Address:  The . [facility], [address] [place], [place] [address]      Provider Number:    Attending Physician Name and Address:  Erick Colace, MD  Relative Name and Phone Number:  [name]  [phone]    Current Level of Care: Other (Comment) (rehab) Recommended Level of Care: Skilled Nursing Facility Prior Approval Number:    Date Approved/Denied:   PASRR Number:    Discharge Plan: SNF    Current Diagnoses: Patient Active Problem List   Diagnosis Date Noted  . Aspiration pneumonia (HCC) 01/12/2016  . Dementia without behavioral disturbance 01/12/2016  . CVA (cerebral infarction) 01/12/2016  . Dementia   . Left lower lobe pneumonia   . Chronic renal insufficiency   . Cerebrovascular accident (CVA) due to thrombosis of left carotid artery (HCC)   . Hyperlipidemia   . Thyroid activity decreased   . Esophageal stricture   . Leukocytosis   . Intracranial vascular stenosis   . Acute embolic stroke (HCC)   . UTI (lower urinary tract infection) 01/07/2016  . Alzheimer's dementia 03/26/2015  . GERD (gastroesophageal reflux disease) 03/26/2015  . HTN (hypertension) 03/26/2015  . CKD (chronic kidney disease), stage III 03/26/2015  . S/P cholecystectomy 03/26/2015    Orientation RESPIRATION BLADDER Height & Weight     Self, Place  Normal Incontinent Weight:   Height:     BEHAVIORAL SYMPTOMS/MOOD NEUROLOGICAL BOWEL NUTRITION STATUS      Continent Diet (regular thin liquids)  AMBULATORY STATUS COMMUNICATION OF NEEDS Skin   Limited Assist Non-Verbally Normal                       Personal Care Assistance Level of Assistance  Bathing, Feeding, Dressing Bathing Assistance: Limited  assistance Feeding assistance: Limited assistance Dressing Assistance: Limited assistance     Functional Limitations Info  Speech Sight Info: Adequate Hearing Info: Adequate Speech Info: Impaired    SPECIAL CARE FACTORS FREQUENCY  PT (By licensed PT), OT (By licensed OT), Speech therapy     PT Frequency: 5 x week OT Frequency: 5 x week     Speech Therapy Frequency: 5x week      Contractures Contractures Info: Not present    Additional Factors Info  Code Status, Allergies Code Status Info: full cod Allergies Info: na           Current Medications (01/25/2016):  This is the current hospital active medication list Current Facility-Administered Medications  Medication Dose Route Frequency Provider Last Rate Last Dose  . acetaminophen (TYLENOL) tablet 325-650 mg  325-650 mg Oral Q4H PRN Mcarthur Rossetti Angiulli, PA-C   650 mg at 01/17/16 1549  . aspirin suppository 300 mg  300 mg Rectal Daily Daniel J Angiulli, PA-C       Or  . aspirin tablet 325 mg  325 mg Oral Daily Mcarthur Rossetti Angiulli, PA-C   325 mg at 01/25/16 0836  . atorvastatin (LIPITOR) tablet 40 mg  40 mg Oral q1800 Mcarthur Rossetti Angiulli, PA-C   40 mg at 01/24/16 1750  . benzocaine (ORAJEL) 10 % mucosal gel   Mouth/Throat BID PRN Newt Lukes, MD      . enoxaparin (LOVENOX) injection 40 mg  40 mg  Subcutaneous Q24H Mcarthur RossettiDaniel J Angiulli, PA-C   40 mg at 01/23/16 2006  . fluconazole (DIFLUCAN) tablet 100 mg  100 mg Oral Daily Pamela S Love, PA-C      . fluticasone [facility](FLONASE) 50 MCG/ACT nasal spray 1 spray  1 spray Each Nare Daily Mcarthur RossettiDaniel J Angiulli, PA-C   1 spray at 01/25/16 0836  . guaiFENesin (ROBITUSSIN) 100 MG/5ML solution 100 mg  5 mL Oral Q4H PRN Newt LukesValerie A Leschber, MD      . levofloxacin [facility](LEVAQUIN) tablet 250 mg  250 mg Oral Daily Mcarthur RossettiDaniel J Angiulli, PA-C   250 mg at 01/25/16 0836  . levothyroxine (SYNTHROID, LEVOTHROID) tablet 25 mcg  25 mcg Oral QAC breakfast Mcarthur RossettiDaniel J Angiulli, PA-C   25 mcg at 01/25/16 0759  . magic  mouthwash w/lidocaine  10 mL Oral QID Pamela S Love, PA-C      . memantine (NAMENDA XR) 24 hr capsule 28 mg  28 mg Oral Daily Mcarthur RossettiDaniel J Angiulli, PA-C   28 mg at 01/25/16 0835  . naphazoline-pheniramine (NAPHCON-A) 0.025-0.3 % ophthalmic solution 1 drop  1 drop Both Eyes Daily PRN Mcarthur Rossettianiel J Angiulli, PA-C      . ondansetron (ZOFRAN) tablet 4 mg  4 mg Oral Q6H PRN Mcarthur Rossettianiel J Angiulli, PA-C       Or  . ondansetron (ZOFRAN) injection 4 mg  4 mg Intravenous Q6H PRN Daniel J Angiulli, PA-C      . pantoprazole (PROTONIX) EC tablet 40 mg  40 mg Oral Daily Mcarthur RossettiDaniel J Angiulli, PA-C   40 mg at 01/25/16 0836  . rivastigmine (EXELON) 9.5 mg/24hr 9.5 mg  9.5 mg Transdermal Q24H Mcarthur Rossettianiel J Angiulli, PA-C   9.5 mg at 01/24/16 1750  . senna-docusate (Senokot-S) tablet 1 tablet  1 tablet Oral QHS PRN Mcarthur Rossettianiel J Angiulli, PA-C      . sorbitol 70 % solution 30 mL  30 mL Oral Daily PRN Charlton Amoraniel J Angiulli, PA-C         Discharge Medications: Please see discharge summary for a list of discharge medications.  Relevant Imaging Results:  Relevant Lab Results:   Additional Information ssn [id number]  Lucy ChrisDupree, Masashi Snowdon G, LCSW

## 2016-01-25 NOTE — Progress Notes (Signed)
Occupational Therapy Session Note  Patient Details  Name: Kayla SantiagoMary C Schroeder MRN: [id number] Date of Birth: [birth year]  Today's Date: 01/25/2016 OT Individual Time: 1100-1155 OT Individual Time Calculation (min): 55 min    Short Term Goals: Week 2:  OT Short Term Goal 1 (Week 2): Continue working on established LTGs set mostly at min assist level.  Skilled Therapeutic Interventions/Progress Updates:  Upon entering the room, pt with no c/o,signs, or symptoms of pain. Her son and daughter in law present in room but asked to leave as they were distracting for pt during this session. Skilled OT session with focus on initiation and sequencing of routine functional tasks and functional mobility. Pt seated in wheelchair at sink with max verbal and tactile cues for initiation and sequencing of tasks with R hand. Pt needing some hand over hand cues as well. Pt able to initiate better with L UE but continued to require max cuing for tasks. Pt propelled wheelchair with increased time and max A 25' towards day room with therapist assisting the rest of the way for time management. Once in dayroom pt placed in front of windows and was able to correctly answer "yes" or "no" questions regarding if it is night/day with increased time to answer. Pt returned to room at end of session with quick release belt donned and family entering back into the room. Call bell and all needed items within reach upon exiting the room.   Therapy Documentation Precautions:  Precautions Precautions: Fall Precaution Comments: hx of dementia, expressive delays, right in-attention and slight weakness Restrictions Weight Bearing Restrictions: No Pain: Pain Assessment Pain Assessment: No/denies pain  See Function Navigator for Current Functional Status.   Therapy/Group: Individual Therapy  Lowella Gripittman, Tayshon Winker L 01/25/2016, 12:47 PM

## 2016-01-26 ENCOUNTER — Inpatient Hospital Stay (HOSPITAL_COMMUNITY): Payer: Medicare Other | Admitting: Physical Therapy

## 2016-01-26 ENCOUNTER — Inpatient Hospital Stay (HOSPITAL_COMMUNITY): Payer: Medicare Other | Admitting: Speech Pathology

## 2016-01-26 ENCOUNTER — Inpatient Hospital Stay (HOSPITAL_COMMUNITY): Payer: Medicare Other | Admitting: Occupational Therapy

## 2016-01-26 DIAGNOSIS — E785 Hyperlipidemia, unspecified: Secondary | ICD-10-CM | POA: Diagnosis not present

## 2016-01-26 DIAGNOSIS — K5790 Diverticulosis of intestine, part unspecified, without perforation or abscess without bleeding: Secondary | ICD-10-CM | POA: Diagnosis not present

## 2016-01-26 DIAGNOSIS — I129 Hypertensive chronic kidney disease with stage 1 through stage 4 chronic kidney disease, or unspecified chronic kidney disease: Secondary | ICD-10-CM | POA: Diagnosis not present

## 2016-01-26 DIAGNOSIS — K219 Gastro-esophageal reflux disease without esophagitis: Secondary | ICD-10-CM | POA: Diagnosis not present

## 2016-01-26 DIAGNOSIS — E8809 Other disorders of plasma-protein metabolism, not elsewhere classified: Secondary | ICD-10-CM | POA: Diagnosis not present

## 2016-01-26 DIAGNOSIS — J189 Pneumonia, unspecified organism: Secondary | ICD-10-CM | POA: Diagnosis not present

## 2016-01-26 DIAGNOSIS — K529 Noninfective gastroenteritis and colitis, unspecified: Secondary | ICD-10-CM | POA: Diagnosis not present

## 2016-01-26 DIAGNOSIS — N183 Chronic kidney disease, stage 3 (moderate): Secondary | ICD-10-CM | POA: Diagnosis not present

## 2016-01-26 DIAGNOSIS — G309 Alzheimer's disease, unspecified: Secondary | ICD-10-CM | POA: Diagnosis not present

## 2016-01-26 DIAGNOSIS — E039 Hypothyroidism, unspecified: Secondary | ICD-10-CM | POA: Diagnosis not present

## 2016-01-26 DIAGNOSIS — I69320 Aphasia following cerebral infarction: Secondary | ICD-10-CM | POA: Diagnosis not present

## 2016-01-26 DIAGNOSIS — I63032 Cerebral infarction due to thrombosis of left carotid artery: Secondary | ICD-10-CM | POA: Diagnosis not present

## 2016-01-26 DIAGNOSIS — A09 Infectious gastroenteritis and colitis, unspecified: Secondary | ICD-10-CM | POA: Diagnosis not present

## 2016-01-26 DIAGNOSIS — R262 Difficulty in walking, not elsewhere classified: Secondary | ICD-10-CM | POA: Diagnosis not present

## 2016-01-26 DIAGNOSIS — M810 Age-related osteoporosis without current pathological fracture: Secondary | ICD-10-CM | POA: Diagnosis not present

## 2016-01-26 DIAGNOSIS — R278 Other lack of coordination: Secondary | ICD-10-CM | POA: Diagnosis not present

## 2016-01-26 DIAGNOSIS — E782 Mixed hyperlipidemia: Secondary | ICD-10-CM | POA: Diagnosis not present

## 2016-01-26 DIAGNOSIS — R5383 Other fatigue: Secondary | ICD-10-CM | POA: Diagnosis not present

## 2016-01-26 DIAGNOSIS — I639 Cerebral infarction, unspecified: Secondary | ICD-10-CM | POA: Diagnosis not present

## 2016-01-26 DIAGNOSIS — I6930 Unspecified sequelae of cerebral infarction: Secondary | ICD-10-CM | POA: Diagnosis not present

## 2016-01-26 DIAGNOSIS — F0280 Dementia in other diseases classified elsewhere without behavioral disturbance: Secondary | ICD-10-CM | POA: Diagnosis present

## 2016-01-26 DIAGNOSIS — I69391 Dysphagia following cerebral infarction: Secondary | ICD-10-CM | POA: Diagnosis not present

## 2016-01-26 DIAGNOSIS — I10 Essential (primary) hypertension: Secondary | ICD-10-CM | POA: Diagnosis not present

## 2016-01-26 DIAGNOSIS — I69351 Hemiplegia and hemiparesis following cerebral infarction affecting right dominant side: Secondary | ICD-10-CM | POA: Diagnosis not present

## 2016-01-26 DIAGNOSIS — M6281 Muscle weakness (generalized): Secondary | ICD-10-CM | POA: Diagnosis not present

## 2016-01-26 DIAGNOSIS — N189 Chronic kidney disease, unspecified: Secondary | ICD-10-CM | POA: Diagnosis not present

## 2016-01-26 NOTE — Progress Notes (Signed)
Occupational Therapy Discharge Summary and OT Intervention  Patient Details  Name: [name] MRN: [id number] Date of Birth: [birth year]  Today's Date: 01/26/2016 OT Individual Time: 2426-8341 OT Individual Time Calculation (min): 54 min    Patient has met 0 of 11 long term goals.  Patient to discharge at overall max- total A with self care.  Reasons goals not met: Pt with medical decline and discharge to SNF for further OT intervention  Recommendation:  Patient will benefit from ongoing skilled OT services in skilled nursing facility setting to continue to advance functional skills in the area of BADL  Equipment: defer to next venue of care  Reasons for discharge: discharge from hospital  Patient/family agrees with progress made and goals achieved: Yes   OT Intervention: Upon entering the room, pt seated supine in bed sleeping. Pt with no signs or symptoms of pain this session. Pt very lethargic and unable to stay awake. LB self care completed in bed secondary to safety with max A for rolling L <> R for hygiene and to don to LB clothing. Stand pivot transfer to wheelchair from bed with max A. UB self care performed at sink while seated in wheelchair. Pt needing max verbal and demonstration cues. Hand over hand assistance given at times secondary to pt's decreased arousal. RN notified of pt condition. Quick release belt donned on pt prior to exiting the room.   OT Discharge Precautions/Restrictions  Precautions Precautions: Fall Precaution Comments: hx of dementia, expressive delays, right in-attention and slight weakness Restrictions Weight Bearing Restrictions: No Pain Pain Assessment Faces Pain Scale: No hurt  Cognition Overall Cognitive Status: History of cognitive impairments - at baseline Arousal/Alertness: Lethargic Orientation Level: Other (comment) (unable to determine) Attention: Sustained Sustained Attention: Impaired Sustained Attention Impairment: Functional  basic;Verbal basic Memory: Impaired Memory Impairment: Storage deficit;Retrieval deficit Awareness: Impaired Awareness Impairment: Intellectual impairment Problem Solving: Impaired Problem Solving Impairment: Functional basic;Verbal basic Executive Function: Initiating;Organizing;Sequencing Sequencing: Impaired Sequencing Impairment: Functional basic Organizing: Impaired Organizing Impairment: Functional basic Initiating: Impaired Initiating Impairment: Functional basic Behaviors: Perseveration Safety/Judgment: Impaired Sensation Sensation Light Touch: Appears Intact Stereognosis: Not tested Hot/Cold: Not tested Proprioception: Impaired by gross assessment Coordination Gross Motor Movements are Fluid and Coordinated: No Fine Motor Movements are Fluid and Coordinated: No Motor  Motor Motor: Hemiplegia;Abnormal postural alignment and control;Motor apraxia;Motor perseverations Motor - Discharge Observations: decreased motor planning in the RUE and LE along with increased posterior bias in standing.   Mobility  Bed Mobility Bed Mobility: Supine to Sit Rolling Right: 2: Max assist Rolling Right Details: Manual facilitation for placement;Manual facilitation for weight bearing;Verbal cues for sequencing;Verbal cues for technique;Visual cues/gestures for precautions/safety;Tactile cues for placement;Tactile cues for weight shifting;Tactile cues for initiation;Tactile cues for sequencing Rolling Left: 2: Max assist Rolling Left Details: Tactile cues for initiation;Tactile cues for sequencing;Tactile cues for placement;Visual cues/gestures for precautions/safety;Visual cues/gestures for sequencing;Verbal cues for sequencing;Verbal cues for technique;Manual facilitation for weight shifting;Manual facilitation for placement Supine to Sit: 3: Mod assist;HOB flat Supine to Sit Details: Tactile cues for sequencing;Verbal cues for sequencing;Manual facilitation for placement;Manual facilitation  for weight shifting Sit to Supine: 2: Max assist Sit to Supine - Details: Tactile cues for initiation;Tactile cues for sequencing;Tactile cues for weight shifting;Tactile cues for placement;Visual cues/gestures for sequencing;Verbal cues for sequencing;Verbal cues for technique;Manual facilitation for weight shifting;Manual facilitation for placement Transfers Transfers: Sit to Stand;Stand to Sit Sit to Stand: 3: Mod assist;With upper extremity assist;From bed Sit to Stand Details: Tactile cues for sequencing;Manual facilitation for weight  shifting Stand to Sit: 3: Mod assist;With upper extremity assist;To bed Stand to Sit Details (indicate cue type and reason): Tactile cues for sequencing;Manual facilitation for weight shifting  Trunk/Postural Assessment  Cervical Assessment Cervical Assessment:  (forward head ) Thoracic Assessment Thoracic Assessment:  (kyphosis) Lumbar Assessment Lumbar Assessment:  (posterior pelvic tilt) Postural Control Righting Reactions: increased posterior lean in standing with decreased wieght shift forward over the mid foot.  Protective Responses: Delayed   Balance Static Sitting Balance Static Sitting - Level of Assistance: 5: Stand by assistance Static Standing Balance Static Standing - Level of Assistance: 3: Mod assist Dynamic Standing Balance Dynamic Standing - Level of Assistance: 3: Mod assist Extremity/Trunk Assessment RUE Assessment RUE Assessment: Exceptions to [facility] RUE Strength RUE Overall Strength Comments: unable to formally test as pt unable to follow commands LUE Assessment LUE Assessment: Within Functional Limits   See Function Navigator for Current Functional Status.  Phineas Semen 01/26/2016, 12:52 PM

## 2016-01-26 NOTE — Progress Notes (Signed)
Physical Therapy Discharge Summary  Patient Details  Name: [name] MRN: [id number] Date of Birth: 1931-09-14  Today's Date: 01/26/2016 PT Individual Time: 1000-1010 PT Individual Time Calculation (min): 10 min   Attempted to engage pt in PT session for grad day activities. Pt with decreased alertness and arousal. Unable to initiate any functional movement and unable to follow 1 step commands.  Nursing made aware.  Patient has met 1 of 9 long term goals. Pt with medical decline during stay, pt regressed and now needs max A for mobility due to continued cognitive deficits as well as decreased attention and arousal.  Patient to discharge at a wheelchair level Total Assist.   Patient to d/c to SNF.  Reasons goals not met: medical issues causing functional decline  Recommendation:  Patient will benefit from ongoing skilled PT services in skilled nursing facility setting to continue to advance safe functional mobility, address ongoing impairments in functional mobility, and minimize fall risk.  Equipment: No equipment provided  Reasons for discharge: discharge from hospital  Patient/family agrees with progress made and goals achieved: Yes  PT Discharge Precautions/Restrictions Precautions Precautions: Fall Precaution Comments: hx of dementia, expressive delays, right in-attention and slight weakness Restrictions Weight Bearing Restrictions: No Pain Pain Assessment Faces Pain Scale: No hurt   Cognition Overall Cognitive Status: History of cognitive impairments - at baseline Sustained Attention: Impaired Memory: Impaired Awareness: Impaired Awareness Impairment: Intellectual impairment Initiating: Impaired Initiating Impairment: Functional basic Behaviors: Perseveration Safety/Judgment: Impaired Sensation Sensation Light Touch: Appears Intact Proprioception: Impaired by gross assessment Coordination Gross Motor Movements are Fluid and Coordinated: No Fine Motor  Movements are Fluid and Coordinated: No Motor  Motor Motor: Hemiplegia;Abnormal postural alignment and control;Motor apraxia;Motor perseverations Motor - Discharge Observations: decreased motor planning in the RUE and LE along with increased posterior bias in standing.     Trunk/Postural Assessment  Cervical Assessment Cervical Assessment:  (forward head ) Thoracic Assessment Thoracic Assessment:  (kyphosis) Lumbar Assessment Lumbar Assessment:  (posterior pelvic tilt) Postural Control Righting Reactions: increased posterior lean in standing with decreased wieght shift forward over the mid foot.  Protective Responses: Delayed   Balance Static Sitting Balance Static Sitting - Level of Assistance: 5: Stand by assistance Static Standing Balance Static Standing - Level of Assistance: 3: Mod assist Dynamic Standing Balance Dynamic Standing - Level of Assistance: 3: Mod assist Extremity Assessment      RLE Assessment RLE Assessment:  (grossly 3/5) LLE Assessment LLE Assessment:  (grossly 4/5)   See Function Navigator for Current Functional Status.  Dishawn Bhargava 01/26/2016, 10:19 AM

## 2016-01-26 NOTE — Progress Notes (Signed)
Patient to be discharge to [facility] report given to RN, and all questions answered. Patient with daughter at bedside, belongings packed by daughter at taken with her. Patient transported by [facility] ambulance to [facility]. [employer]

## 2016-01-26 NOTE — Plan of Care (Signed)
Problem: RH Balance Goal: LTG: Patient will maintain dynamic sitting balance (OT) LTG: Patient will maintain dynamic sitting balance with assistance during activities of daily living (OT)  Outcome: Not Met (add Reason) Pt with medical decline and d/c to SNF Goal: LTG Patient will maintain dynamic standing with ADLs (OT) LTG: Patient will maintain dynamic standing balance with assist during activities of daily living (OT)  Outcome: Not Met (add Reason) Pt with medical decline and d/c to SNF  Problem: RH Eating Goal: LTG Patient will perform eating w/assist, cues/equip (OT) LTG: Patient will perform eating with assist, with/without cues using equipment (OT)  Outcome: Not Met (add Reason) Pt with medical decline and d/c to SNF  Problem: RH Grooming Goal: LTG Patient will perform grooming w/assist,cues/equip (OT) LTG: Patient will perform grooming with assist, with/without cues using equipment (OT)  Outcome: Not Met (add Reason) Pt with medical decline and d/c to SNF  Problem: RH Bathing Goal: LTG Patient will bathe with assist, cues/equipment (OT) LTG: Patient will bathe specified number of body parts with assist with/without cues using equipment (position) (OT)  Outcome: Not Met (add Reason) Pt with medical decline and d/c to SNF  Problem: RH Dressing Goal: LTG Patient will perform upper body dressing (OT) LTG Patient will perform upper body dressing with assist, with/without cues (OT).  Outcome: Not Met (add Reason) Pt with medical decline and d/c to SNF Goal: LTG Patient will perform lower body dressing w/assist (OT) LTG: Patient will perform lower body dressing with assist, with/without cues in positioning using equipment (OT)  Outcome: Not Met (add Reason) Pt with medical decline and d/c to SNF  Problem: RH Toileting Goal: LTG Patient will perform toileting w/assist, cues/equip (OT) LTG: Patient will perform toiletiing (clothes management/hygiene) with assist, with/without  cues using equipment (OT)  Outcome: Not Met (add Reason) Pt with medical decline and d/c to SNF  Problem: RH Functional Use of Upper Extremity Goal: LTG Patient will use RT/LT upper extremity as a (OT) LTG: Patient will use right/left upper extremity as a stabilizer/gross assist/diminished/nondominant/dominant level with assist, with/without cues during functional activity (OT)  Outcome: Not Met (add Reason) Pt with medical decline and d/c to SNF  Problem: RH Toilet Transfers Goal: LTG Patient will perform toilet transfers w/assist (OT) LTG: Patient will perform toilet transfers with assist, with/without cues using equipment (OT)  Outcome: Not Met (add Reason) Pt with medical decline and d/c to SNF  Problem: RH Tub/Shower Transfers Goal: LTG Patient will perform tub/shower transfers w/assist (OT) LTG: Patient will perform tub/shower transfers with assist, with/without cues using equipment (OT)  Outcome: Not Met (add Reason) Pt with medical decline and d/c to SNF

## 2016-01-28 ENCOUNTER — Telehealth: Payer: Self-pay

## 2016-01-28 NOTE — Telephone Encounter (Signed)
Made [facility] aware of pt's appointment on 02/02/16 @ 1:45.

## 2016-02-02 ENCOUNTER — Encounter: Payer: Self-pay | Admitting: Physical Medicine & Rehabilitation

## 2016-02-02 ENCOUNTER — Ambulatory Visit (HOSPITAL_BASED_OUTPATIENT_CLINIC_OR_DEPARTMENT_OTHER): Payer: Medicare Other | Admitting: Physical Medicine & Rehabilitation

## 2016-02-02 ENCOUNTER — Encounter: Payer: Medicare Other | Attending: Physical Medicine & Rehabilitation

## 2016-02-02 VITALS — BP 123/64 | HR 103 | Resp 14

## 2016-02-02 DIAGNOSIS — K219 Gastro-esophageal reflux disease without esophagitis: Secondary | ICD-10-CM | POA: Diagnosis not present

## 2016-02-02 DIAGNOSIS — I69351 Hemiplegia and hemiparesis following cerebral infarction affecting right dominant side: Secondary | ICD-10-CM

## 2016-02-02 DIAGNOSIS — G309 Alzheimer's disease, unspecified: Secondary | ICD-10-CM | POA: Insufficient documentation

## 2016-02-02 DIAGNOSIS — K5790 Diverticulosis of intestine, part unspecified, without perforation or abscess without bleeding: Secondary | ICD-10-CM | POA: Diagnosis not present

## 2016-02-02 DIAGNOSIS — I129 Hypertensive chronic kidney disease with stage 1 through stage 4 chronic kidney disease, or unspecified chronic kidney disease: Secondary | ICD-10-CM | POA: Diagnosis not present

## 2016-02-02 DIAGNOSIS — A09 Infectious gastroenteritis and colitis, unspecified: Secondary | ICD-10-CM | POA: Diagnosis not present

## 2016-02-02 DIAGNOSIS — F0280 Dementia in other diseases classified elsewhere without behavioral disturbance: Secondary | ICD-10-CM | POA: Diagnosis not present

## 2016-02-02 DIAGNOSIS — N183 Chronic kidney disease, stage 3 (moderate): Secondary | ICD-10-CM | POA: Diagnosis not present

## 2016-02-02 DIAGNOSIS — E785 Hyperlipidemia, unspecified: Secondary | ICD-10-CM | POA: Insufficient documentation

## 2016-02-02 DIAGNOSIS — E039 Hypothyroidism, unspecified: Secondary | ICD-10-CM | POA: Insufficient documentation

## 2016-02-02 DIAGNOSIS — M810 Age-related osteoporosis without current pathological fracture: Secondary | ICD-10-CM | POA: Insufficient documentation

## 2016-02-02 DIAGNOSIS — K529 Noninfective gastroenteritis and colitis, unspecified: Secondary | ICD-10-CM | POA: Diagnosis not present

## 2016-02-02 NOTE — Patient Instructions (Signed)
See written instructions

## 2016-02-02 NOTE — Progress Notes (Signed)
Subjective:    Patient ID: [name], female    DOB: 27-Jun-1931, 82 y.o.   MRN: [id number] 82 year old right-handed female with history of hypertension, Alzheimer's disease, and chronic renal insufficiency who presented on January 07, 2016, with acute onset of right- sided weakness.  By report, recently was taken to [facility] 2 days prior with diagnosis of urinary tract infection and placed on Cipro.  She lives with her daughter with assistance as needed.  MRI of the brain showed small-to-moderate volume of acute infarct, high left frontal lobe predominantly involving the ACA territory.  MRA with moderate-to-severe proximal left A2-ACA stenosis.  Echocardiogram with ejection fraction of 70% grade 1 diastolic dysfunction.  The patient did not receive tPA.  Maintained on aspirin for CVA prophylaxis. Subcutaneous Lovenox for DVT prophylaxis.  Chest x-ray, January 10, 2016, suggestive of bronchitis likely with left lower lobe pneumonia, placed on Unasyn.  Tolerating a regular diet  HPI Inpatient rehabilitation stay at [facility] (DATE OF ADMISSION:  01/12/2016 DATE OF DISCHARGE:  01/26/2016)  followed by transfer to [facility] skilled nursing facility on 01/26/2016 Functional status at time of discharge Activities of daily living and homemaking, needed some cues for standing at the sink side to complete her bathing and dressing.  Max assist to guide her walker as well as moderate assist to help guide the patient to shower bench.  Max assist for lower body Dressing. Family did not feel like provide necessary assistance at home with recommendations of skilled nursing facility in bed made available 01/26/2016.  Patient noted to have some decline toward the end of her hospital stay however repeat CT of the brain showed no clearcut evidence of new CVA was questionable evidence of right cerebellar infarct as well as the previously seen left ACA distribution infarct with  subacute changes  Patient is now at [facility]. A nurse's aide from the facility is with her. The nurses aide does not know anything about the patient's functional status.  Patient has severe cognitive deficits and is unable to answer questions  Review of systems cannot obtain due to cognitive status Pain Inventory Average Pain 0 Pain Right Now 0 My pain is no pain  In the last 24 hours, has pain interfered with the following? General activity 0 Relation with others 0 Enjoyment of life 0 What TIME of day is your pain at its worst? no pain Sleep (in general) no selection  Pain is worse with: no pain Pain improves with: no pain Relief from Meds: no pain  Mobility ability to climb steps?  no do you drive?  no use a wheelchair needs help with transfers  Function not employed: date last employed .  Neuro/Psych bladder control problems confusion  Prior Studies hospital f/u  Physicians involved in your care hospital f/u   Family History  Problem Relation Age of Onset  . Ovarian cancer Mother   . Heart disease Sister   . Irritable bowel syndrome Sister   . Colon cancer Neg Hx   . Colon polyps Neg Hx   . Diabetes Neg Hx    Social History   Social History  . Marital Status: Married    Spouse Name: N/A  . Number of Children: 2  . Years of Education: N/A   Occupational History  . Retired    Social History Main Topics  . Smoking status: Never Smoker   . Smokeless tobacco: Never Used  . Alcohol Use: No  . Drug Use:  No  . Sexual Activity: Not Asked   Other Topics Concern  . None   Social History Narrative   Past Surgical History  Procedure Laterality Date  . Cholecystectomy    . Abdominal hysterectomy    . Breast surgery Bilateral    Past Medical History  Diagnosis Date  . Hypertension   . Senile-onset Alzheimer's dementia with behavioral disturbance   . Verrucous keratosis   . Complete tear of right rotator cuff   . Hyperlipidemia     . Hypothyroidism   . Osteoporosis   . Chronic kidney disease, stage 3   . Keratoconjunctivitis due to Adenovirus   . GERD without esophagitis   . Noninfective gastroenteritis and colitis   . Arthritis   . Infectious colitis   . Diverticulosis   . Status post dilation of esophageal narrowing    BP 123/64 mmHg  Pulse 103  Resp 14  SpO2 95%  Opioid Risk Score:   Fall Risk Score:  `1  Depression screen PHQ 2/9  Depression screen PHQ 2/9 02/02/2016  Decreased Interest 0  Down, Depressed, Hopeless 0  PHQ - 2 Score 0     Review of Systems  All other systems reviewed and are negative.      Objective:   Physical Exam  Constitutional: She is oriented to person, place, and time. She appears well-developed and well-nourished.  HENT:  Head: Normocephalic and atraumatic.  Eyes: Conjunctivae are normal. Pupils are equal, round, and reactive to light.  Neck: Normal range of motion.  Cardiovascular: Normal rate, regular rhythm and normal heart sounds.   Pulmonary/Chest: Effort normal and breath sounds normal.  Abdominal: Soft. Bowel sounds are normal.  Neurological: She is alert and oriented to person, place, and time.  Cannot assess sensation due to her cognitive status  Motor strength she can cooperate with manual muscle testing with  gestural cues Her motor strength is 5/5 in left deltoid, biceps, triceps, grip, hip flexor, knee extensor, ankle dorsal flexor 3/5 in the right deltoid, biceps, triceps, grip 4/5 in the right hip flexor and extensor and flexor  Psychiatric: She has a normal mood and affect.  Nursing note and vitals reviewed.   Patient is alert she answers Hello She not cannot state her name      Assessment & Plan:  Medical Problem List and Plan: 1.  Right sided weakness secondary to left posterior frontal lobe infarct: Continue PT OT and speech at skilled nursing facility then order PT OT through home health following SNF discharge. PMandR follow-up on an  as-needed basis  2. Pain Management: Tylenol as needed 3. Mood/dementia: Namenda 28 mg daily,Exelon patch 4. Neuropsych: This patient is not capable of making decisions on her own behalf. 5 Skin/Wound Care: Monitor while in skilled nursing facility 6. Fluids/Electrolytes/Nutrition: Monitor while in skilled nursing facility 7. Left lower lobe pneumonia. Resolved after antibiotic treatment 8. Hypothyroidism. Synthroid. TSH 4.258, Follow-up with PCP after SNF10. Chronic renal insufficiency. Baseline creatinine 1.08. Follow-up With PCP  after SNF 9. Hyperlipidemia. Lipitor Follow-up with PCP  after SNF

## 2016-03-09 DIAGNOSIS — N189 Chronic kidney disease, unspecified: Secondary | ICD-10-CM | POA: Diagnosis not present

## 2016-03-09 DIAGNOSIS — I6930 Unspecified sequelae of cerebral infarction: Secondary | ICD-10-CM | POA: Diagnosis not present

## 2016-03-09 DIAGNOSIS — E782 Mixed hyperlipidemia: Secondary | ICD-10-CM | POA: Diagnosis not present

## 2016-03-10 ENCOUNTER — Ambulatory Visit (INDEPENDENT_AMBULATORY_CARE_PROVIDER_SITE_OTHER): Payer: Medicare Other | Admitting: Nurse Practitioner

## 2016-03-10 ENCOUNTER — Encounter: Payer: Self-pay | Admitting: Nurse Practitioner

## 2016-03-10 VITALS — BP 138/76 | HR 91 | Ht 65.0 in

## 2016-03-10 DIAGNOSIS — I10 Essential (primary) hypertension: Secondary | ICD-10-CM | POA: Diagnosis not present

## 2016-03-10 DIAGNOSIS — I63032 Cerebral infarction due to thrombosis of left carotid artery: Secondary | ICD-10-CM

## 2016-03-10 DIAGNOSIS — I639 Cerebral infarction, unspecified: Secondary | ICD-10-CM | POA: Diagnosis not present

## 2016-03-10 DIAGNOSIS — E785 Hyperlipidemia, unspecified: Secondary | ICD-10-CM

## 2016-03-10 DIAGNOSIS — F0390 Unspecified dementia without behavioral disturbance: Secondary | ICD-10-CM

## 2016-03-10 NOTE — Progress Notes (Signed)
[facility]  PATIENT: [name] DOB: 1931/04/20   REASON FOR VISIT: Hospital follow-up for stroke, acute infarction in the high left frontal lobe predominantly involving the ACA territory HISTORY FROM: Daughter Ms. Graves    HISTORY OF PRESENT ILLNESS:[name] is a 82 y.o. female with a history of dementia who has history of stroke in March.  She was taken to [facility]  and diagnosed with urinary tract infection. She was started on Cipro without improvement. Over the next  few days she has been unable to complete her own ADLs and has had to be fed. She was therefore brought into the emergency room where UA was negative. Premorbid modified rankin scale: I suspect 1 or 2 as the nurse reports that she is able to accomplish all of her ADLs prior to admission. Patient was not administered IV t-PA secondary to unclear time of onset, but definitely out of the window. She was admitted for further evaluation and treatment. She had had 2 3 days of right-sided weakness with fall at home. She apparently lived independently prior to her stroke event even though she was  Demented. Daughter provides history today, patient has little speech. She is now in a skilled facility. She has not had further stroke or TIA symptoms. She was getting physical therapy but that has stopped as she has plateaued. MRI of the brain with left frontal lobe predominantly involving ACA territory infarct. CTA of the neck no significant stenosis. MRA diffuse intracranial atherosclerosis. 2-D echo with ejection fraction 65-70% LDL 121. No statin prior to admission now off Lipitor No anti-thrombotic prior to admission now on aspirin. She returns for hospital follow-up  REVIEW OF SYSTEMS: Full 14 system review of systems performed and notable only for those listed, all others are neg:  Constitutional: neg  Cardiovascular: neg Ear/Nose/Throat: neg  Skin: neg Eyes: neg Respiratory: neg Gastroitestinal:  neg  Hematology/Lymphatic: neg  Endocrine: neg Musculoskeletal: Gait difficulty Allergy/Immunology: neg Neurological: Memory loss, dementia Psychiatric: neg Sleep : neg   ALLERGIES: No Known Allergies  HOME MEDICATIONS: Outpatient Prescriptions Prior to Visit  Medication Sig Dispense Refill  . aspirin 325 MG tablet Take 1 tablet (325 mg total) by mouth daily. 30 tablet 0  . atorvastatin (LIPITOR) 40 MG tablet Take 1 tablet (40 mg total) by mouth daily at 6 PM. 30 tablet 0  . fluticasone (FLONASE) 50 MCG/ACT nasal spray Place 1 spray into both nostrils daily.    Marland Kitchen levothyroxine (SYNTHROID, LEVOTHROID) 25 MCG tablet Take 1 tablet (25 mcg total) by mouth daily before breakfast. 30 tablet 1  . memantine (NAMENDA XR) 28 MG CP24 24 hr capsule Take 28 mg by mouth daily.    . naphazoline-pheniramine (NAPHCON-A) 0.025-0.3 % ophthalmic solution Place 1 drop into both eyes daily as needed for irritation.    . pantoprazole (PROTONIX) 40 MG tablet Take 40 mg by mouth daily.    . rivastigmine (EXELON) 9.5 mg/24hr Place 9.5 mg onto the skin daily. Take 1 patch once a day.    . senna-docusate (SENOKOT-S) 8.6-50 MG tablet Take 1 tablet by mouth at bedtime as needed for mild constipation. 10 tablet 0   No facility-administered medications prior to visit.    PAST MEDICAL HISTORY: Past Medical History  Diagnosis Date  . Hypertension   . Senile-onset Alzheimer's dementia with behavioral disturbance   . Verrucous keratosis   . Complete tear of right rotator cuff   . Hyperlipidemia   . Hypothyroidism   .  Osteoporosis   . Chronic kidney disease, stage 3   . Keratoconjunctivitis due to Adenovirus   . GERD without esophagitis   . Noninfective gastroenteritis and colitis   . Arthritis   . Infectious colitis   . Diverticulosis   . Status post dilation of esophageal narrowing     PAST SURGICAL HISTORY: Past Surgical History  Procedure Laterality Date  . Cholecystectomy    . Abdominal  hysterectomy    . Breast surgery Bilateral     FAMILY HISTORY: Family History  Problem Relation Age of Onset  . Ovarian cancer Mother   . Heart disease Sister   . Irritable bowel syndrome Sister   . Colon cancer Neg Hx   . Colon polyps Neg Hx   . Diabetes Neg Hx     SOCIAL HISTORY: Social History   Social History  . Marital Status: Married    Spouse Name: N/A  . Number of Children: 2  . Years of Education: N/A   Occupational History  . Retired    Social History Main Topics  . Smoking status: Never Smoker   . Smokeless tobacco: Never Used  . Alcohol Use: No  . Drug Use: No  . Sexual Activity: Not on file   Other Topics Concern  . Not on file   Social History Narrative   [facility].          PHYSICAL EXAM  Filed Vitals:   03/10/16 1049  BP: 138/76  Pulse: 91  Height:  (1.651 m)   There is no weight on file to calculate BMI.  Generalized: Well developed, in no acute distress  Head: normocephalic and atraumatic,. Oropharynx benign  Neck: Supple, no carotid bruits  Cardiac: Regular rate rhythm, no murmur  Musculoskeletal: No deformity   Neurological examination   Mentation: Alert , alert, minimal speech does not follow commands .   Cranial nerve II-XII: Fundoscopic exam not done Pupils were equal round reactive to light extraocular movements were full, able to track objects with blinks to threat . Right facial droop, tongue midline . Motor: normal bulk and tone, full strength in the BUE, BLE, on the left, right upper  extremity 3/5, right lower extremity 4 out of 5 Sensory: Not reliable due to dementia withdraws to pain Coordination: Unable to perform Reflexes: 1+ upper lower and symmetric, plantar responses were flexor bilaterally. Gait and Station: Not ambulated in wheelchair  DIAGNOSTIC DATA (LABS, IMAGING, TESTING) - I reviewed patient records, labs, notes, testing and imaging myself where available.  Lab Results    Component Value Date   WBC 8.0 01/22/2016   HGB 12.8 01/22/2016   HCT 40.4 01/22/2016   MCV 94.0 01/22/2016   PLT 373 01/22/2016      Component Value Date/Time   NA 137 01/22/2016 1409   K 3.5 01/22/2016 1409   CL 104 01/22/2016 1409   CO2 20* 01/22/2016 1409   GLUCOSE 183* 01/22/2016 1409   BUN 11 01/22/2016 1409   CREATININE 1.09* 01/22/2016 1409   CALCIUM 8.2* 01/22/2016 1409   PROT 6.1* 01/13/2016 0637   ALBUMIN 2.6* 01/13/2016 0637   AST 24 01/13/2016 0637   ALT 18 01/13/2016 0637   ALKPHOS 62 01/13/2016 0637   BILITOT 0.6 01/13/2016 0637   GFRNONAA 46* 01/22/2016 1409   GFRAA 53* 01/22/2016 1409   Lab Results  Component Value Date   CHOL 201* 01/08/2016   HDL 37* 01/08/2016   LDLCALC 121* 01/08/2016   TRIG  215* 01/08/2016   CHOLHDL 5.4 01/08/2016   Lab Results  Component Value Date   HGBA1C 5.4 01/08/2016   No results found for: VITAMINB12 Lab Results  Component Value Date   TSH 4.258 01/08/2016      ASSESSMENT AND PLAN  82 y.o. year old female  has a past medical history of Hypertension; Senile-onset Alzheimer's dementia with behavioral disturbance;  Hyperlipidemia; Hypothyroidism;  Chronic kidney disease, stage 3; And left ACA infarct secondary to diffuse intracranial atherosclerosis. Patient was not on aspirin or statins prior to admission. The patient is a current patient of Dr. Roda ShuttersXu  who is out of the office today . This note is sent to the work in doctor.     Stressed the importance of management of risk factors to prevent further stroke to the daughter Continue aspirinfor secondary stroke prevention Maintain strict control of hypertension with blood pressure goal below 130/90, today's reading 138/76 continue antihypertensive medications Cholesterol with LDL cholesterol less than 70, followed by primary care,  most recent 121 continue statin drugs Lipitor  Follow-up in 4 to 6 months  Nilda RiggsNancy Carolyn Uchechukwu Dhawan, [facility] WestGNP, [facility] HospitalBC, APRN  [facility] [address], [address] [place], [place] [phone]

## 2016-03-10 NOTE — Patient Instructions (Signed)
Per nsg home sheet 

## 2016-03-10 NOTE — Progress Notes (Signed)
I agree with the above plan 

## 2016-03-21 DIAGNOSIS — Z79899 Other long term (current) drug therapy: Secondary | ICD-10-CM | POA: Diagnosis not present

## 2016-09-13 ENCOUNTER — Encounter: Payer: Self-pay | Admitting: Nurse Practitioner

## 2016-09-13 ENCOUNTER — Ambulatory Visit (INDEPENDENT_AMBULATORY_CARE_PROVIDER_SITE_OTHER): Payer: Medicare Other | Admitting: Nurse Practitioner

## 2016-09-13 VITALS — BP 126/77 | HR 77 | Ht 65.0 in | Wt 148.0 lb

## 2016-09-13 DIAGNOSIS — F0280 Dementia in other diseases classified elsewhere without behavioral disturbance: Secondary | ICD-10-CM

## 2016-09-13 DIAGNOSIS — I639 Cerebral infarction, unspecified: Secondary | ICD-10-CM | POA: Diagnosis not present

## 2016-09-13 DIAGNOSIS — E785 Hyperlipidemia, unspecified: Secondary | ICD-10-CM | POA: Diagnosis not present

## 2016-09-13 DIAGNOSIS — I10 Essential (primary) hypertension: Secondary | ICD-10-CM | POA: Diagnosis not present

## 2016-09-13 DIAGNOSIS — I679 Cerebrovascular disease, unspecified: Secondary | ICD-10-CM | POA: Diagnosis not present

## 2016-09-13 DIAGNOSIS — R4701 Aphasia: Secondary | ICD-10-CM | POA: Diagnosis not present

## 2016-09-13 DIAGNOSIS — G309 Alzheimer's disease, unspecified: Secondary | ICD-10-CM

## 2016-09-13 DIAGNOSIS — I69351 Hemiplegia and hemiparesis following cerebral infarction affecting right dominant side: Secondary | ICD-10-CM

## 2016-09-13 NOTE — Progress Notes (Signed)
[facility]  PATIENT: [name] DOB: 1931-07-28   REASON FOR VISIT:  follow-up for stroke, acute infarction in the high left frontal lobe predominantly involving the ACA territory HISTORY FROM: Transporter  from nursing home    HISTORY OF PRESENT ILLNESS:UPDATE 11/14/2017CM Ms. [name], 82 year old female returns for follow-up. She currently resides at a skilled facility and has a long history of dementia. In March 2017 she had 2-3 day episode of right-sided weakness with a fall at her home. She apparently lived independently prior to her stroke event. She is basically aphasic and only says yes periodically. She has not had further stroke or TIA symptoms according to the transporter  and she remains on aspirin for secondary stroke prevention along with Lipitor for hyperlipidemia. She was not on either one of these drugs prior to her stroke event. She is seated in a wheelchair today. She returns for reevaluation. She has a DO NOT RESUSCITATE in place    HISTORY 5/11/17CMMary C Elsie LincolnRouth is a 82 y.o. female with a history of dementia who has history of stroke in March.  She was taken to [facility]  and diagnosed with urinary tract infection. She was started on Cipro without improvement. Over the next  few days she has been unable to complete her own ADLs and has had to be fed. She was therefore brought into the emergency room where UA was negative. Premorbid modified rankin scale: I suspect 1 or 2 as the nurse reports that she is able to accomplish all of her ADLs prior to admission. Patient was not administered IV t-PA secondary to unclear time of onset, but definitely out of the window. She was admitted for further evaluation and treatment. She had had 2 3 days of right-sided weakness with fall at home. She apparently lived independently prior to her stroke event even though she was  Demented. Daughter provides history today, patient has little speech. She is now in a  skilled facility. She has not had further stroke or TIA symptoms. She was getting physical therapy but that has stopped as she has plateaued. MRI of the brain with left frontal lobe predominantly involving ACA territory infarct. CTA of the neck no significant stenosis. MRA diffuse intracranial atherosclerosis. 2-D echo with ejection fraction 65-70% LDL 121. No statin prior to admission now off Lipitor No anti-thrombotic prior to admission now on aspirin. She returns for hospital follow-up  REVIEW OF SYSTEMS: Full 14 system review of systems performed and notable only for those listed, all others are neg:  Constitutional: neg  Cardiovascular: neg Ear/Nose/Throat: neg  Skin: neg Eyes: neg Respiratory: neg Gastroitestinal: neg  Hematology/Lymphatic: neg  Endocrine: neg Musculoskeletal: Gait difficulty Allergy/Immunology: neg Neurological: Memory loss, severe dementia, aphasia Psychiatric: neg Sleep : neg   ALLERGIES: No Known Allergies  HOME MEDICATIONS: Outpatient Medications Prior to Visit  Medication Sig Dispense Refill  . aspirin 325 MG tablet Take 1 tablet (325 mg total) by mouth daily. 30 tablet 0  . atorvastatin (LIPITOR) 40 MG tablet Take 1 tablet (40 mg total) by mouth daily at 6 PM. 30 tablet 0  . fluticasone (FLONASE) 50 MCG/ACT nasal spray Place 1 spray into both nostrils daily.    . furosemide (LASIX) 20 MG tablet Take 20 mg by mouth. Every other day    . levothyroxine (SYNTHROID, LEVOTHROID) 25 MCG tablet Take 1 tablet (25 mcg total) by mouth daily before breakfast. 30 tablet 1  . memantine (NAMENDA XR) 28 MG CP24 24 hr capsule  Take 28 mg by mouth daily.    . naphazoline-pheniramine (NAPHCON-A) 0.025-0.3 % ophthalmic solution Place 1 drop into both eyes daily as needed for irritation.    . pantoprazole (PROTONIX) 40 MG tablet Take 40 mg by mouth daily.    . rivastigmine (EXELON) 9.5 mg/24hr Place 9.5 mg onto the skin daily. Take 1 patch once a day.    . senna-docusate  (SENOKOT-S) 8.6-50 MG tablet Take 1 tablet by mouth at bedtime as needed for mild constipation. (Patient not taking: Reported on 09/13/2016) 10 tablet 0   No facility-administered medications prior to visit.     PAST MEDICAL HISTORY: Past Medical History:  Diagnosis Date  . Arthritis   . Chronic kidney disease, stage 3   . Complete tear of right rotator cuff   . Diverticulosis   . GERD without esophagitis   . Hyperlipidemia   . Hypertension   . Hypothyroidism   . Infectious colitis   . Keratoconjunctivitis due to adenovirus   . Noninfective gastroenteritis and colitis   . Osteoporosis   . Senile-onset Alzheimer's dementia with behavioral disturbance   . Status post dilation of esophageal narrowing   . Verrucous keratosis     PAST SURGICAL HISTORY: Past Surgical History:  Procedure Laterality Date  . ABDOMINAL HYSTERECTOMY    . BREAST SURGERY Bilateral   . cholecystectomy      FAMILY HISTORY: Family History  Problem Relation Age of Onset  . Ovarian cancer Mother   . Heart disease Sister   . Irritable bowel syndrome Sister   . Colon cancer Neg Hx   . Colon polyps Neg Hx   . Diabetes Neg Hx     SOCIAL HISTORY: Social History   Social History  . Marital status: Married    Spouse name: N/A  . Number of children: 2  . Years of education: N/A   Occupational History  . Retired    Social History Main Topics  . Smoking status: Never Smoker  . Smokeless tobacco: Never Used  . Alcohol use No  . Drug use: No  . Sexual activity: Not on file   Other Topics Concern  . Not on file   Social History Narrative   Living [place]  In [place].          PHYSICAL EXAM  Vitals:   09/13/16 1059  BP: 126/77  Pulse: 77  Weight: 148 lb (67.1 kg)  Height: 5\' 5"  (1.651 m)   Body mass index is 24.63 kg/m.  Generalized: Well developed, in no acute distress  Head: normocephalic and atraumatic,. Oropharynx benign  Neck: Supple, no carotid bruits  Cardiac: Regular  rate rhythm, no murmur  Musculoskeletal: No deformity   Neurological examination   Mentation: Alert ,  minimal speech does not follow commands .   Cranial nerve II-XII: Fundoscopic exam not done Pupils were equal round reactive to light extraocular movements were full, able to track objects, blinks to threat .  tongue midline . Motor: normal bulk and tone, full strength in the BUE, BLE, on the left, right upper  extremity 3/5, right lower extremity 4 out of 5 Sensory: Not reliable due to dementia withdraws to pain Coordination: Unable to perform Reflexes: 1+ upper lower and symmetric, plantar responses were flexor bilaterally. Gait and Station: Not ambulated in wheelchair  DIAGNOSTIC DATA (LABS, IMAGING, TESTING) - I reviewed patient records, labs, notes, testing and imaging myself where available.  Lab Results  Component Value Date   WBC 8.0 01/22/2016  HGB 12.8 01/22/2016   HCT 40.4 01/22/2016   MCV 94.0 01/22/2016   PLT 373 01/22/2016      Component Value Date/Time   NA 137 01/22/2016 1409   K 3.5 01/22/2016 1409   CL 104 01/22/2016 1409   CO2 20 (L) 01/22/2016 1409   GLUCOSE 183 (H) 01/22/2016 1409   BUN 11 01/22/2016 1409   CREATININE 1.09 (H) 01/22/2016 1409   CALCIUM 8.2 (L) 01/22/2016 1409   PROT 6.1 (L) 01/13/2016 0637   ALBUMIN 2.6 (L) 01/13/2016 0637   AST 24 01/13/2016 0637   ALT 18 01/13/2016 0637   ALKPHOS 62 01/13/2016 0637   BILITOT 0.6 01/13/2016 0637   GFRNONAA 46 (L) 01/22/2016 1409   GFRAA 53 (L) 01/22/2016 1409   Lab Results  Component Value Date   CHOL 201 (H) 01/08/2016   HDL 37 (L) 01/08/2016   LDLCALC 121 (H) 01/08/2016   TRIG 215 (H) 01/08/2016   CHOLHDL 5.4 01/08/2016   Lab Results  Component Value Date   HGBA1C 5.4 01/08/2016   No results found for: [id number] Lab Results  Component Value Date   TSH 4.258 01/08/2016      ASSESSMENT AND PLAN  82 y.o. year old female  has a past medical history of Hypertension; Senile-onset  Alzheimer's dementia with out behavioral disturbance;  Hyperlipidemia; Hypothyroidism;  Chronic kidney disease, stage 3; And left ACA infarct secondary to diffuse intracranial atherosclerosis. Patient was not on aspirin or statins prior to admission.She returns for stroke follow up  PLAN:  Continue aspirin for secondary stroke prevention Maintain strict control of hypertension with blood pressure goal below 130/90, today's reading 126/77  Cholesterol with LDL cholesterol less than 70, followed by primary care,  most recent 121 continue  Lipitor  Will discharge to follow up [place]  Nancy Carolyn Martin, [facility]GNP, [facility]BC, APRN  [facility] [address], [address] [place], [place] [address] [phone]

## 2016-09-13 NOTE — Patient Instructions (Signed)
Per skilled nursing sheet 

## 2016-09-19 NOTE — Progress Notes (Signed)
I reviewed above note and agree with the assessment and plan.  Marvel PlanJindong Brantley Wiley, MD PhD Stroke Neurology 09/19/2016 5:48 PM

## 2017-04-30 DEATH — deceased

## 2018-03-04 IMAGING — CR DG CHEST 1V PORT
1 series · 1 of 1 positions shown · non-contrast
Comparison: Chest x-ray 02/27/2015.

CLINICAL DATA: 85-year-old female recently victim of the stroke.
Nonresponsive. Febrile.

EXAM:
PORTABLE CHEST 1 VIEW

[AP]
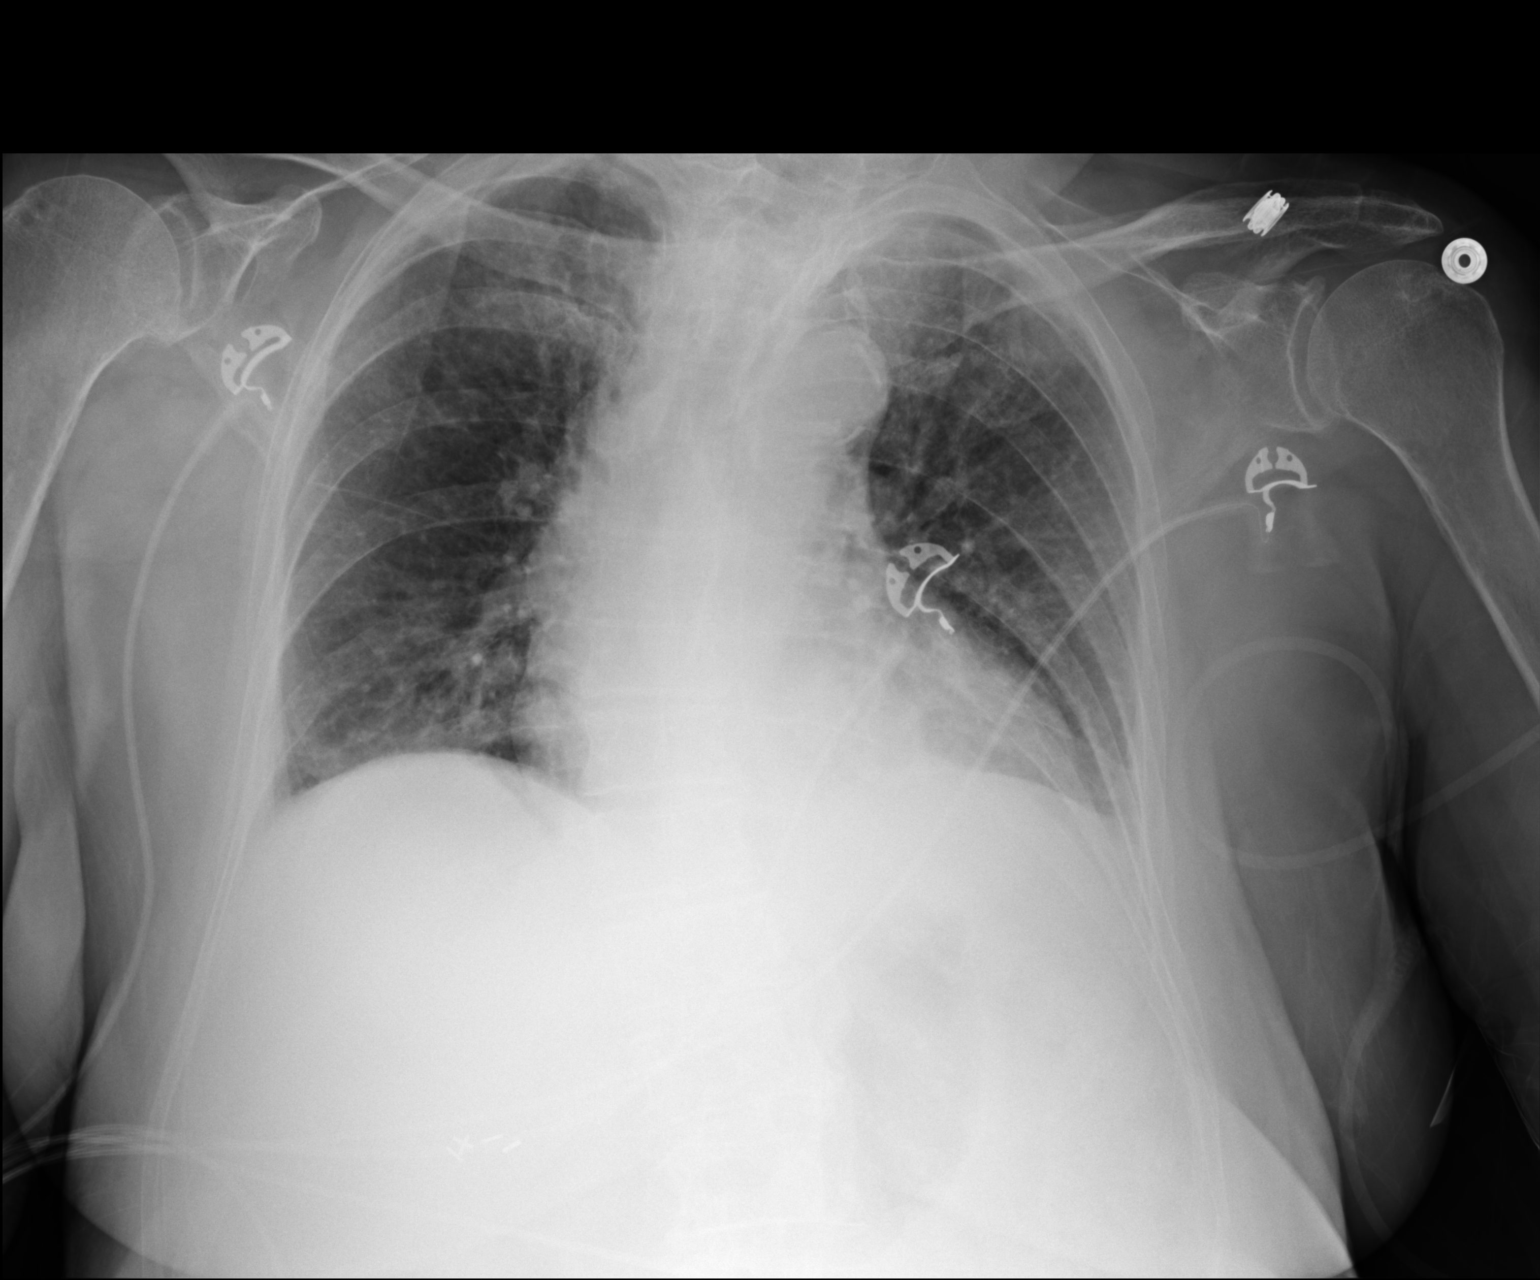

[1 of 1 positions shown; findings below may reference images not displayed]

FINDINGS: Lung volumes are low. Mild diffuse peribronchial cuffing.
Consolidation in the medial aspect of the left lower lobe partially
obscuring the medial left hemidiaphragm. No definite pleural
effusions. No evidence of pulmonary edema. Heart size is normal. The
patient is rotated to the left on today's exam, resulting in
distortion of the mediastinal contours and reduced diagnostic
sensitivity and specificity for mediastinal pathology.
Atherosclerosis in the thoracic aorta.
IMPRESSION: 1. The appearance of the chest suggests bronchitis, likely with left
lower lobe pneumonia. Followup PA and lateral chest X-ray is
recommended in 3-4 weeks following trial of antibiotic therapy to
ensure resolution and exclude underlying malignancy.
2. Atherosclerosis.

## 2018-03-16 IMAGING — CT CT HEAD W/O CM
2 series · 15 of 30 positions shown, 17 images · non-contrast
Comparison: Head MRI 01/08/2016 and CT 01/07/2016

CLINICAL DATA: Stroke.  Weakness.

EXAM:
CT HEAD WITHOUT CONTRAST
TECHNIQUE: Contiguous axial images were obtained from the base of the skull
through the vertex without intravenous contrast.

[Series 2: head without · axial · non-contrast · 0.41mm/px · z∈[-104,+16]mm · 7 of 33 slices shown, 9 images]
[im 5/33  brain]
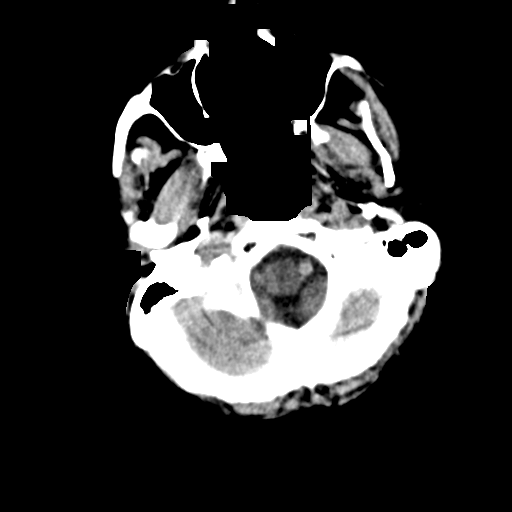
[im 5/33  bone]
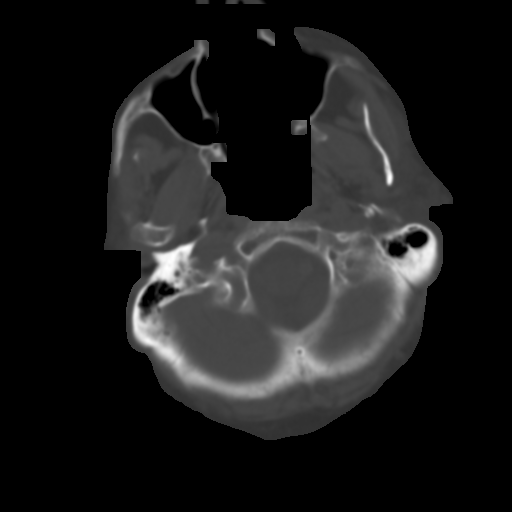
[im 9/33  brain]
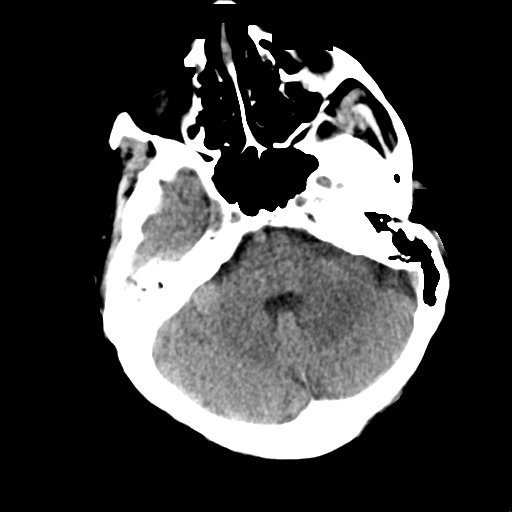
[im 13/33  brain]
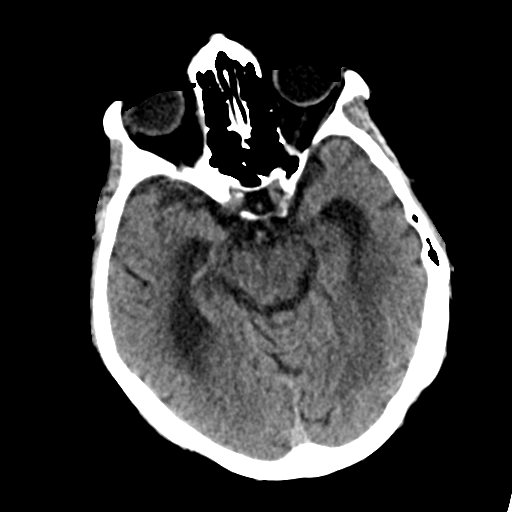
[im 17/33  brain]
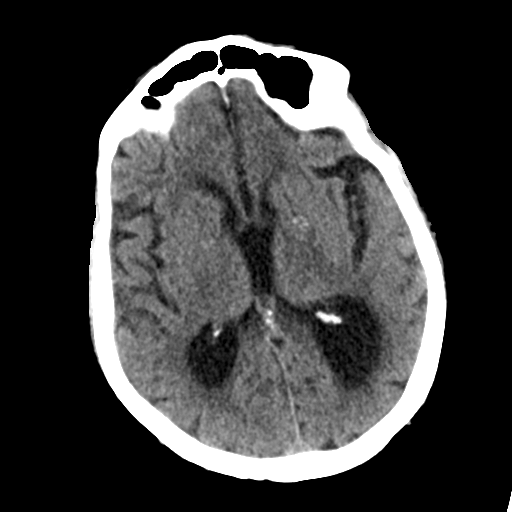
[im 21/33  brain]
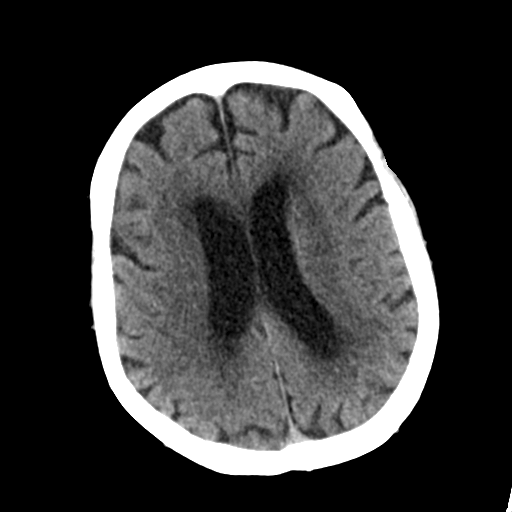
[im 21/33  bone]
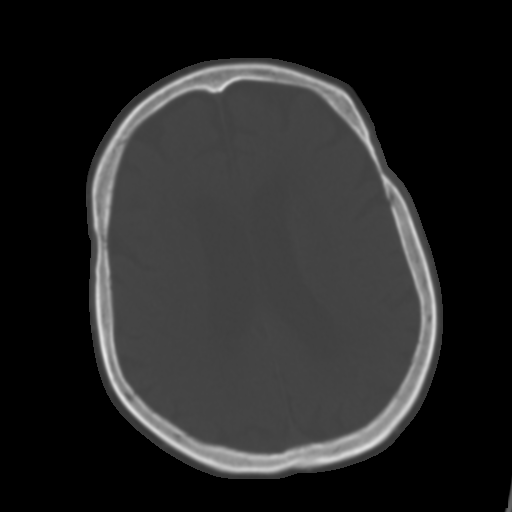
[im 25/33  brain]
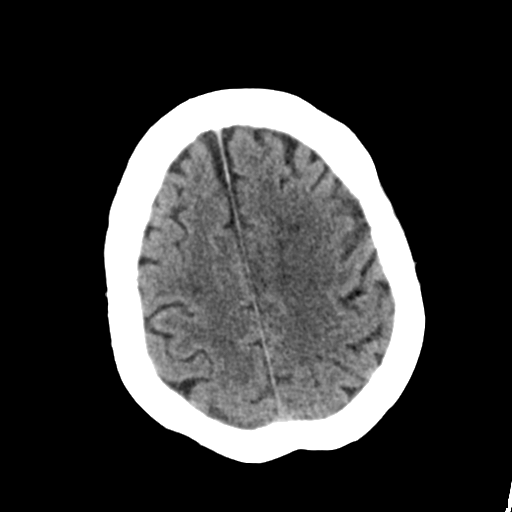
[im 29/33  brain]
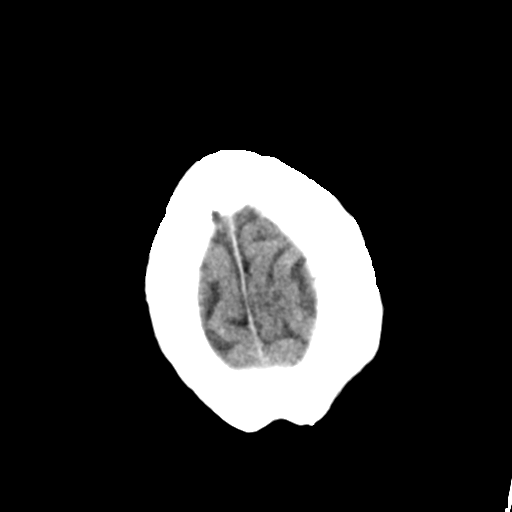

[Series 3: head bone · axial · 0.41mm/px · z∈[-108,+22]mm · 8 of 83 slices shown]
[im 9/83  bone]
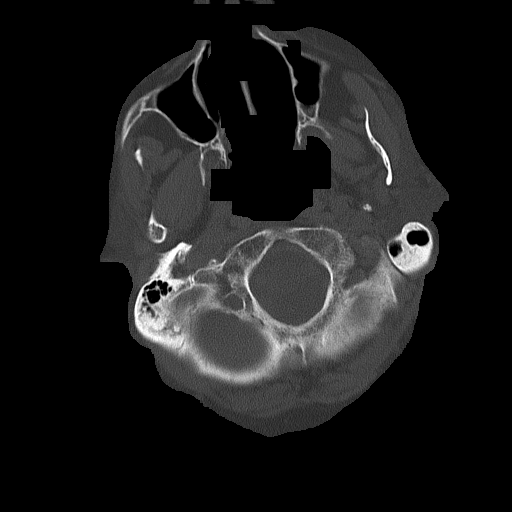
[im 17/83  bone]
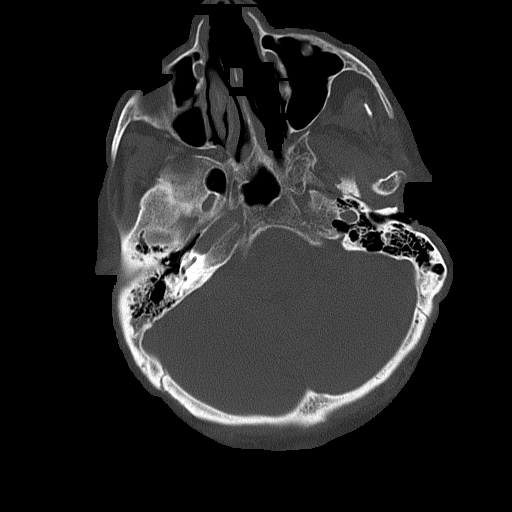
[im 25/83  bone]
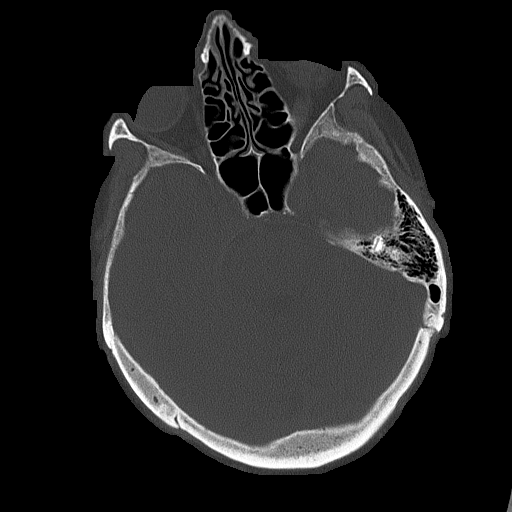
[im 37/83  bone]
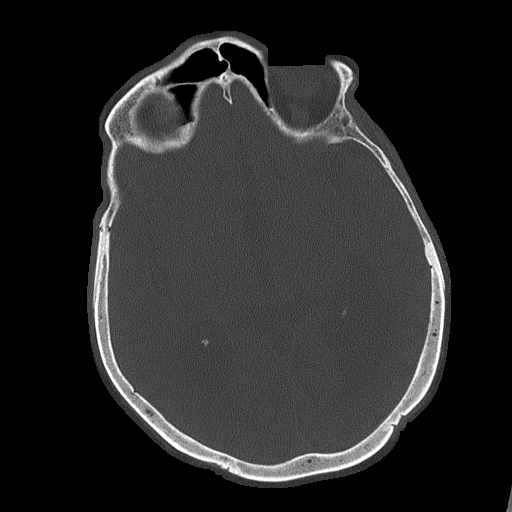
[im 46/83  bone]
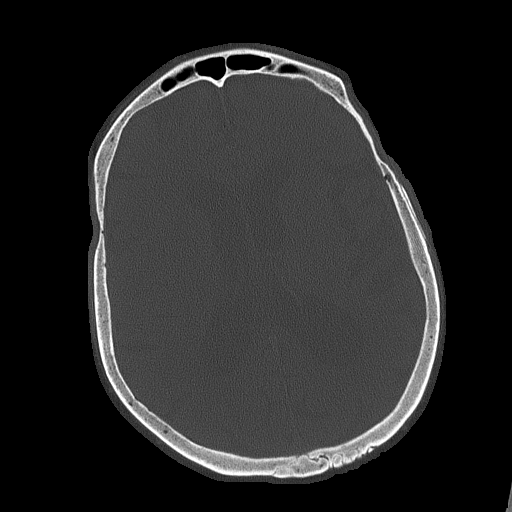
[im 58/83  bone]
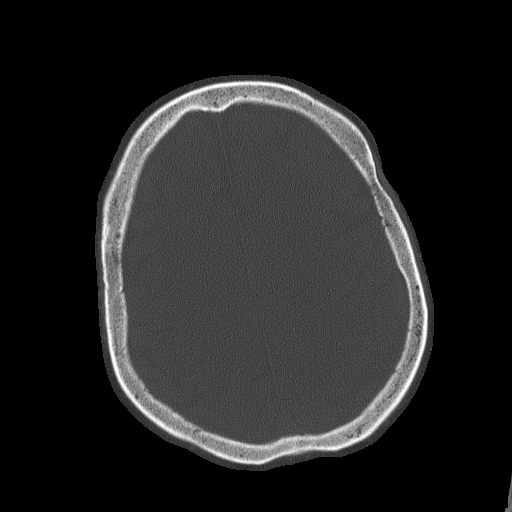
[im 66/83  bone]
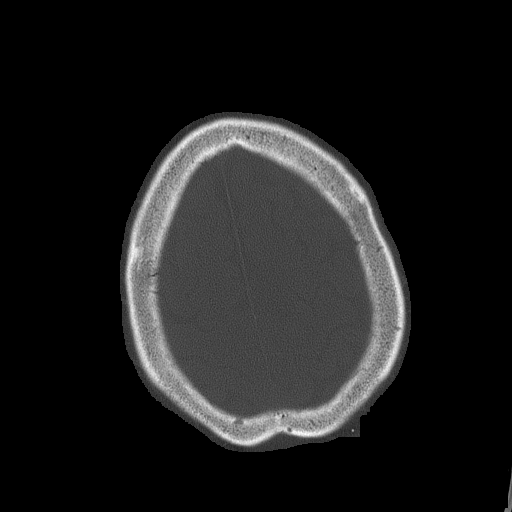
[im 74/83  bone]
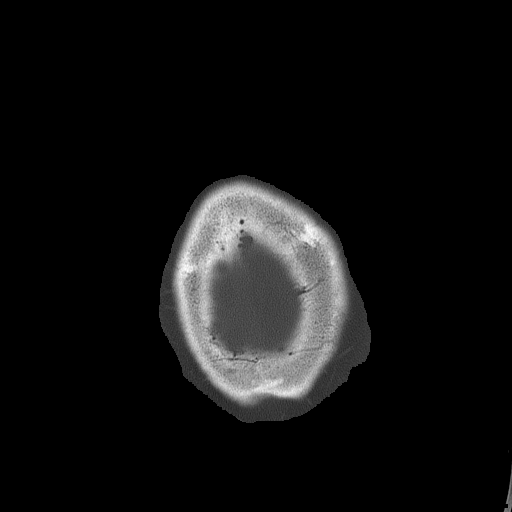

[15 of 30 positions shown; findings below may reference images not displayed]

FINDINGS: Hypoattenuation is again seen in the posterior left frontal lobe in
the ACA territory corresponding to the previously demonstrated
infarct, with the low density less pronounced than on the prior CT.
There is a 5 mm hypodensity in the inferior right cerebellum (series
2, image 6) which was not clearly present on the prior studies. A
chronic left pontine infarct is again noted. There is moderate
cerebral atrophy. No acute intracranial hemorrhage, mass, midline
shift, or extra-axial fluid collection is seen. Moderate chronic
small vessel ischemic changes are noted in the cerebral white
matter.

Prior bilateral cataract extraction is noted. The visualized
paranasal sinuses are clear. There is a small right mastoid
effusion. No acute osseous abnormality is identified. Calcified
atherosclerosis is noted at the skull base.
IMPRESSION: 1. Subacute left frontal lobe ACA infarct.
2. Questionable new 5 mm right cerebellar infarct versus artifact.
3. No acute intracranial hemorrhage.
4. Moderate chronic small vessel ischemic disease and cerebral
atrophy.
